# Patient Record
Sex: Male | Born: 1970 | Race: White | Hispanic: No | Marital: Single | Smoking: Never smoker
Health system: Southern US, Community
[De-identification: ages and names within clinical notes are randomized; demographics above are authoritative.]

## PROBLEM LIST (undated history)

## (undated) DIAGNOSIS — J45909 Unspecified asthma, uncomplicated: Secondary | ICD-10-CM

## (undated) DIAGNOSIS — F319 Bipolar disorder, unspecified: Secondary | ICD-10-CM

## (undated) DIAGNOSIS — G35 Multiple sclerosis: Principal | ICD-10-CM

## (undated) DIAGNOSIS — K439 Ventral hernia without obstruction or gangrene: Secondary | ICD-10-CM

## (undated) DIAGNOSIS — K219 Gastro-esophageal reflux disease without esophagitis: Secondary | ICD-10-CM

## (undated) DIAGNOSIS — I10 Essential (primary) hypertension: Secondary | ICD-10-CM

## (undated) DIAGNOSIS — L409 Psoriasis, unspecified: Secondary | ICD-10-CM

## (undated) DIAGNOSIS — I639 Cerebral infarction, unspecified: Secondary | ICD-10-CM

## (undated) DIAGNOSIS — G4700 Insomnia, unspecified: Secondary | ICD-10-CM

## (undated) HISTORY — DX: Essential (primary) hypertension: I10

## (undated) HISTORY — DX: Unspecified asthma, uncomplicated: J45.909

## (undated) HISTORY — DX: Insomnia, unspecified: G47.00

## (undated) HISTORY — DX: Ventral hernia without obstruction or gangrene: K43.9

## (undated) HISTORY — DX: Bipolar disorder, unspecified: F31.9

## (undated) HISTORY — DX: Psoriasis, unspecified: L40.9

## (undated) HISTORY — PX: HERNIA REPAIR: SHX51

## (undated) HISTORY — DX: Gastro-esophageal reflux disease without esophagitis: K21.9

## (undated) HISTORY — DX: Multiple sclerosis: G35

---

## 2009-04-18 ENCOUNTER — Ambulatory Visit: Payer: Self-pay | Admitting: Internal Medicine

## 2009-04-18 DIAGNOSIS — G4700 Insomnia, unspecified: Secondary | ICD-10-CM | POA: Insufficient documentation

## 2009-04-18 DIAGNOSIS — F319 Bipolar disorder, unspecified: Secondary | ICD-10-CM

## 2009-04-18 DIAGNOSIS — J45909 Unspecified asthma, uncomplicated: Secondary | ICD-10-CM | POA: Insufficient documentation

## 2009-04-18 HISTORY — DX: Unspecified asthma, uncomplicated: J45.909

## 2009-04-18 HISTORY — DX: Bipolar disorder, unspecified: F31.9

## 2009-04-18 LAB — CONVERTED CEMR LAB
AST: 26 units/L (ref 0–37)
Albumin: 3.8 g/dL (ref 3.5–5.2)
BUN: 11 mg/dL (ref 6–23)
Basophils Absolute: 0.1 10*3/uL (ref 0.0–0.1)
Bilirubin Urine: NEGATIVE
CO2: 30 meq/L (ref 19–32)
Chloride: 110 meq/L (ref 96–112)
Cholesterol: 141 mg/dL (ref 0–200)
Eosinophils Absolute: 0.2 10*3/uL (ref 0.0–0.7)
GFR calc non Af Amer: 99.37 mL/min (ref >60)
Glucose, Bld: 97 mg/dL (ref 70–99)
HCT: 43.5 % (ref 39.0–52.0)
Hemoglobin: 14.6 g/dL (ref 13.0–17.0)
Ketones, ur: NEGATIVE mg/dL
LDL Cholesterol: 73 mg/dL (ref 0–99)
Leukocytes, UA: NEGATIVE
Lymphs Abs: 1.1 10*3/uL (ref 0.7–4.0)
MCHC: 33.6 g/dL (ref 30.0–36.0)
MCV: 91.2 fL (ref 78.0–100.0)
Monocytes Absolute: 0.5 10*3/uL (ref 0.1–1.0)
Neutro Abs: 5 10*3/uL (ref 1.4–7.7)
Nitrite: NEGATIVE
PSA: 0.72 ng/mL (ref 0.10–4.00)
Platelets: 202 10*3/uL (ref 150.0–400.0)
Potassium: 4.5 meq/L (ref 3.5–5.1)
RDW: 13.1 % (ref 11.5–14.6)
Sodium: 143 meq/L (ref 135–145)
Specific Gravity, Urine: 1.015 (ref 1.000–1.030)
Total Bilirubin: 0.4 mg/dL (ref 0.3–1.2)
Total Protein, Urine: NEGATIVE mg/dL
Triglycerides: 74 mg/dL (ref 0.0–149.0)
VLDL: 14.8 mg/dL (ref 0.0–40.0)
pH: 6 (ref 5.0–8.0)

## 2009-04-27 ENCOUNTER — Telehealth: Payer: Self-pay | Admitting: Internal Medicine

## 2009-05-05 ENCOUNTER — Telehealth: Payer: Self-pay | Admitting: Internal Medicine

## 2009-05-06 ENCOUNTER — Telehealth: Payer: Self-pay | Admitting: Internal Medicine

## 2010-03-07 NOTE — Miscellaneous (Signed)
Summary: DESIGNATED PARTY RELEASE/[facility]  DESIGNATED PARTY RELEASE/[facility]   Imported By: Lester [place] 04/21/2009 07:47:34  _____________________________________________________________________  External Attachment:    Type:   Image     Comment:   External Document

## 2010-03-07 NOTE — Progress Notes (Signed)
Summary: Seroquel  Phone Note Call from Patient   Caller: Patient [phone] Summary of Call: pt called stating that he would like to stop taking seroquel. pt states he is having nausea and is anxious until he eats and takes his vitamins. Pt also report "dreaming alot" . Per pt he has not gone to Psych referral because he was told by MD tat he should go only if he felt he needed to. Initial call taken by: Margaret Pyle, CMA,  May 05, 2009 3:15 PM  Follow-up for Phone Call        pt should cont seroquel as is;  should see psychiatry asap  (I did not state that he should only go "if he needed to") Follow-up by: Corwin Levins MD,  May 05, 2009 3:16 PM  Additional Follow-up for Phone Call Additional follow up Details #1::        I spoke with the pt, and informed him of the above. Additional Follow-up by: Margaret Pyle, CMA,  May 05, 2009 3:18 PM/ Sydell Axon, SMA

## 2010-03-07 NOTE — Assessment & Plan Note (Signed)
Summary: NEW PT/BCBS/#/METABOLISM TOO HIGH?/LB   Vital Signs:  Patient profile:   40 year old male Height:      67 inches Weight:      163.50 pounds BMI:     25.70 O2 Sat:      96 % on Room air Temp:     97.8 degrees F oral Pulse rate:   57 / minute BP sitting:   140 / 94  (left arm) Cuff size:   regular  Vitals Entered ByZella Ball Ewing (April 18, 2009 3:45 PM)  O2 Flow:  Room air CC: New Pt, New BCBS/RE   CC:  New Pt and New BCBS/RE.  History of Present Illness: here to establish,  recently begun on seroquel as above which has really helped his racing thoughts;  has increase racing thoughts, stayed up for several nights in a row, and well as "low times"  and more increased work problems recently in the past yr;  Pt denies CP, sob, doe, wheezing, orthopnea, pnd, worsening LE edema, palps, dizziness or syncope  Pt denies new neuro symptoms such as headache, facial or extremity weakness   Preventive Screening-Counseling & Management  Alcohol-Tobacco     Smoking Status: never      Drug Use:  no.    Problems Prior to Update: 1)  Preventive Health Care  (ICD-V70.0) 2)  Bipolar Disorder Unspecified  (ICD-296.80) 3)  Insomnia  (ICD-780.52) 4)  Asthma, Childhood  (ICD-493.00)  Medications Prior to Update: 1)  None  Current Medications (verified): 1)  Seroquel Xr 50 Mg Xr24h-Tab (Quetiapine Fumarate) .... 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 father with arthritis, heart disease, stroke, HTN, DM uncle and aunt with thyroid disease grandparent with heart diseaes, DM mother with TIA aunt with stroke  Social History: Reviewed history and no changes required. 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         all otherwise negative per pt -   Physical Exam  General:  alert and overweight-appearing.   Head:  normocephalic and atraumatic.   Eyes:  vision grossly intact, pupils equal, and pupils round.   Ears:  R ear normal and L ear normal.   Nose:  no external deformity and no nasal discharge.   Mouth:  no gingival abnormalities and pharynx pink and moist.   Neck:  supple and no masses.   Lungs:  normal respiratory effort and normal breath sounds.   Heart:  normal rate and regular rhythm.  Abdomen:  soft, non-tender, and normal bowel sounds.   Msk:  no joint tenderness and no joint swelling.   Extremities:  no edema, no erythema  Neurologic:  cranial nerves II-XII intact and strength normal in all extremities.   Psych:  not depressed appearing and moderately anxious.     Impression & Recommendations:  Problem # 1:  Preventive Health Care (ICD-V70.0)  Overall doing well, age appropriate education and  counseling updated and referral for appropriate preventive services done unless declined, immunizations up to date or declined, diet counseling done if overweight, urged to quit smoking if smokes , most recent labs reviewed and current ordered if appropriate, ecg reviewed or declined (interpretation per ECG scanned in the EMR if done); information regarding Medicare Prevention requirements given if appropriate   Orders: TLB-BMP (Basic Metabolic Panel-BMET) (80048-METABOL) TLB-CBC Platelet - w/Differential (85025-CBCD) TLB-Hepatic/Liver Function Pnl (80076-HEPATIC) TLB-Lipid Panel (80061-LIPID) TLB-TSH (Thyroid Stimulating Hormone) (84443-TSH) TLB-PSA (Prostate Specific Antigen) (84153-PSA) TLB-Udip ONLY (81003-UDIP)  Problem # 2:  BIPOLAR DISORDER UNSPECIFIED (ICD-296.80)  to increase the seroquel to 100 mg XR ;  refer psychiatry  Orders: Psychiatric Referral (Psych)  Complete Medication List: 1)  Seroquel Xr 50 Mg Xr24h-tab (Quetiapine fumarate) .... 2 by mouth at bedtime  Patient Instructions: 1)  increase the seroquel XR to 2 at night 2)  You will be contacted about the referral(s) to: Psychiatry 3)  Please go to the Lab in the basement for your blood and/or urine tests today 4)  Please schedule a follow-up appointment in 1 year or sooner if needed Prescriptions: SEROQUEL XR 50 MG XR24H-TAB (QUETIAPINE FUMARATE) 2 by mouth at bedtime  #60 x 5   Entered and Authorized by:   Corwin Levins MD   Signed by:   Corwin Levins MD on 04/18/2009   Method used:   Print then Give to Patient   RxID:   [id number]

## 2010-03-07 NOTE — Progress Notes (Signed)
Summary: Referral  Phone Note Call from Patient Call back at Home Phone [phone]   Caller: Patient [phone] Summary of Call: pt called stating that Dr. Jennelle Human does not accept pt Insuranace. Pt is requesting a new referral to Dr. Tomasa Rand. Initial call taken by: Margaret Pyle, CMA,  May 05, 2009 4:15 PM  Follow-up for Phone Call        ok for referral - I will do Follow-up by: Corwin Levins MD,  May 05, 2009 4:49 PM  Additional Follow-up for Phone Call Additional follow up Details #1::        pt informed Additional Follow-up by: Margaret Pyle, CMA,  May 05, 2009 4:53 PM

## 2010-03-07 NOTE — Progress Notes (Signed)
Summary: SUPPLEMENT  Phone Note Call from Patient   Summary of Call: Patient is requesting to know if it is ok to take L-tyrsine OTC?  Initial call taken by: Lamar Sprinkles, CMA,  May 06, 2009 3:45 PM  Follow-up for Phone Call        I would not do so at this time as we are not sure of the possible side effects that could results in other medication not working as well Follow-up by: Corwin Levins MD,  May 06, 2009 4:44 PM  Additional Follow-up for Phone Call Additional follow up Details #1::        pt informed Additional Follow-up by: Margaret Pyle, CMA,  May 09, 2009 3:44 PM

## 2010-03-07 NOTE — Progress Notes (Signed)
Summary: Seroquel  Phone Note Call from Patient   Caller: Patient [phone] Summary of Call: pt called requesting his current RX for Seroquel 50mg  qd be changes to 25mg  two times a day instead. pt says the daily dose is causing decreased energy. please advise Initial call taken by: Margaret Pyle, CMA,  April 27, 2009 2:36 PM  Follow-up for Phone Call        ok to do - to robin to handle Follow-up by: Corwin Levins MD,  April 27, 2009 2:46 PM    New/Updated Medications: SEROQUEL 25 MG TABS (QUETIAPINE FUMARATE) 1 by mouth two times a day Prescriptions: SEROQUEL 25 MG TABS (QUETIAPINE FUMARATE) 1 by mouth two times a day  #60 x 2   Entered by:   Scharlene Gloss   Authorized by:   Corwin Levins MD   Signed by:   Scharlene Gloss on 04/27/2009   Method used:   Faxed to ...       [facility] [address] (retail)       [address]       [place], [place]  [address]       Ph: [phone]       Fax: [phone]   RxID:   [id number]

## 2012-02-21 ENCOUNTER — Encounter: Payer: Self-pay | Admitting: Internal Medicine

## 2012-02-21 ENCOUNTER — Other Ambulatory Visit (INDEPENDENT_AMBULATORY_CARE_PROVIDER_SITE_OTHER): Payer: BC Managed Care – PPO

## 2012-02-21 ENCOUNTER — Ambulatory Visit (INDEPENDENT_AMBULATORY_CARE_PROVIDER_SITE_OTHER): Payer: BC Managed Care – PPO | Admitting: Internal Medicine

## 2012-02-21 VITALS — BP 130/80 | HR 46 | Temp 98.1°F | Ht 67.0 in | Wt 156.5 lb

## 2012-02-21 DIAGNOSIS — J209 Acute bronchitis, unspecified: Secondary | ICD-10-CM

## 2012-02-21 DIAGNOSIS — Z0000 Encounter for general adult medical examination without abnormal findings: Secondary | ICD-10-CM

## 2012-02-21 DIAGNOSIS — Z0001 Encounter for general adult medical examination with abnormal findings: Secondary | ICD-10-CM | POA: Insufficient documentation

## 2012-02-21 LAB — URINALYSIS, ROUTINE W REFLEX MICROSCOPIC
Bilirubin Urine: NEGATIVE
Nitrite: NEGATIVE
Total Protein, Urine: NEGATIVE
pH: 6 (ref 5.0–8.0)

## 2012-02-21 LAB — HEPATIC FUNCTION PANEL
ALT: 30 U/L (ref 0–53)
AST: 18 U/L (ref 0–37)
Alkaline Phosphatase: 86 U/L (ref 39–117)
Bilirubin, Direct: 0.1 mg/dL (ref 0.0–0.3)
Total Bilirubin: 0.7 mg/dL (ref 0.3–1.2)

## 2012-02-21 LAB — CBC WITH DIFFERENTIAL/PLATELET
Basophils Absolute: 0 10*3/uL (ref 0.0–0.1)
Basophils Relative: 0.2 % (ref 0.0–3.0)
Eosinophils Absolute: 0 10*3/uL (ref 0.0–0.7)
MCHC: 34.1 g/dL (ref 30.0–36.0)
MCV: 91.2 fl (ref 78.0–100.0)
Monocytes Absolute: 0.5 10*3/uL (ref 0.1–1.0)
Neutrophils Relative %: 72 % (ref 43.0–77.0)
Platelets: 190 10*3/uL (ref 150.0–400.0)
RDW: 14.4 % (ref 11.5–14.6)

## 2012-02-21 LAB — BASIC METABOLIC PANEL
CO2: 28 mEq/L (ref 19–32)
Chloride: 106 mEq/L (ref 96–112)
Potassium: 4.9 mEq/L (ref 3.5–5.1)

## 2012-02-21 LAB — LIPID PANEL
Cholesterol: 172 mg/dL (ref 0–200)
LDL Cholesterol: 107 mg/dL — ABNORMAL HIGH (ref 0–99)
Total CHOL/HDL Ratio: 3

## 2012-02-21 LAB — TSH: TSH: 0.86 u[IU]/mL (ref 0.35–5.50)

## 2012-02-21 MED ORDER — HYDROCODONE-HOMATROPINE 5-1.5 MG/5ML PO SYRP: 5.0000 mL | ORAL_SOLUTION | Freq: Four times a day (QID) | ORAL | Status: DC | PRN

## 2012-02-21 MED ORDER — AZITHROMYCIN 250 MG PO TABS: ORAL_TABLET | ORAL | Status: DC

## 2012-02-21 NOTE — Assessment & Plan Note (Signed)

## 2012-02-21 NOTE — Progress Notes (Signed)
Subjective:    Patient ID: Russell Moses., male    DOB: [birth year], 42 y.o.   MRN: [id number]  HPI Here for wellness and f/u after not seen since mar 2011;  Overall doing ok;  Pt denies CP, worsening SOB, DOE, wheezing, orthopnea, PND, worsening LE edema, palpitations, dizziness or syncope, except incidentally Here with acute onset mild to mod 2-3 days ST, HA, general weakness and malaise, with prod cough greenish sputum.   Pt denies neurological change such as new headache, facial or extremity weakness.  Pt denies polydipsia, polyuria, or low sugar symptoms. Pt states overall good compliance with treatment and medications, good tolerability, and has been trying to follow lower cholesterol diet.  Pt denies worsening depressive symptoms, suicidal ideation or panic. No fever, night sweats, wt loss, loss of appetite, or other constitutional symptoms.  Pt states good ability with ADL's, has low fall risk, home safety reviewed and adequate, no other significant changes in hearing or vision, and only occasionally active with exercise. No other new complaints Past Medical History  Diagnosis Date  . BIPOLAR DISORDER UNSPECIFIED 04/18/2009    Qualifier: Diagnosis of  By: Jonny Ruiz MD, Len Blalock   . ASTHMA, CHILDHOOD 04/18/2009    Qualifier: Diagnosis of  By: Jonny Ruiz MD, Len Blalock    History reviewed. No pertinent past surgical history.  reports that he has never smoked. He does not have any smokeless tobacco history on file. He reports that he does not drink alcohol or use illicit drugs. family history includes Diabetes in his father; Hypertension in his father; and Stroke in his father. No Known Allergies No current outpatient prescriptions on file prior to visit.   Review of Systems Constitutional: Negative for diaphoresis, activity change, appetite change or unexpected weight change.  HENT: Negative for hearing loss, ear pain, facial swelling, mouth sores and neck stiffness.   Eyes: Negative for pain, redness and  visual disturbance.  Respiratory: Negative for shortness of breath and wheezing.   Cardiovascular: Negative for chest pain and palpitations.  Gastrointestinal: Negative for diarrhea, blood in stool, abdominal distention or other pain Genitourinary: Negative for hematuria, flank pain or change in urine volume.  Musculoskeletal: Negative for myalgias and joint swelling.  Skin: Negative for color change and wound.  Neurological: Negative for syncope and numbness. other than noted Hematological: Negative for adenopathy.  Psychiatric/Behavioral: Negative for hallucinations, self-injury, decreased concentration and agitation.      Objective:   Physical Exam BP 130/80  Pulse 46  Temp 98.1 F (36.7 C) (Oral)  Ht 5\' 7"  (1.702 m)  Wt 156 lb 8 oz (70.988 kg)  BMI 24.51 kg/m2  SpO2 97% VS noted, mild ill Constitutional: Pt is oriented to person, place, and time. Appears well-developed and well-nourished.  Head: Normocephalic and atraumatic.  Right Ear: External ear normal.  Left Ear: External ear normal.  Nose: Nose normal.  Mouth/Throat: Oropharynx is clear and moist.  Bilat tm's with mild erythema.  Max sinus areas mild tender.  Pharynx with mild erythema, no exudate Eyes: Conjunctivae and EOM are normal. Pupils are equal, round, and reactive to light.  Neck: Normal range of motion. Neck supple. No JVD present. No tracheal deviation present.  Cardiovascular: Normal rate, regular rhythm, normal heart sounds and intact distal pulses.   Pulmonary/Chest: Effort normal and breath sounds normal.  - no rales or wheezing Abdominal: Soft. Bowel sounds are normal. There is no tenderness. No HSM  Musculoskeletal: Normal range of motion. Exhibits no edema.  Lymphadenopathy:  Has  no cervical adenopathy.  Neurological: Pt is alert and oriented to person, place, and time. Pt has normal reflexes. No cranial nerve deficit.  Skin: Skin is warm and dry. No rash noted.  Psychiatric:  Has  normal mood and  affect. Behavior is normal. except mild nervous    Assessment & Plan:

## 2012-02-21 NOTE — Assessment & Plan Note (Signed)
Mild to mod, for antibx course,  to f/u any worsening symptoms or concerns 

## 2012-02-21 NOTE — Patient Instructions (Addendum)
Please take all new medication as prescribed - the antibiotic, and cough medicine Please continue all other medications as before, and refills have been done if requested. Please have the pharmacy call with any other refills you may need. Please continue your efforts at being more active, low cholesterol diet, and weight control. Please go to the LAB in the Basement (turn left off the elevator) for the tests to be done today You will be contacted by phone if any changes need to be made immediately.  Otherwise, you will receive a letter about your results with an explanation, but please check with MyChart first. Thank you for enrolling in MyChart. Please follow the instructions below to securely access your online medical record. MyChart allows you to send messages to your doctor, view your test results, renew your prescriptions, schedule appointments, and more. To Log into My Chart online, please go by [employer] or [employer] to [email or web address], or download the MyChart App from the [employer] of [employer] .  Your Username is: [email or web address] (pass [email or web address]) Please send a practice Message on Mychart later today. Please return in 1 year for your yearly visit, or sooner if needed, with Lab testing done 3-5 days before

## 2012-03-22 ENCOUNTER — Other Ambulatory Visit: Payer: Self-pay

## 2012-08-18 ENCOUNTER — Telehealth: Payer: Self-pay

## 2012-08-18 DIAGNOSIS — R5383 Other fatigue: Secondary | ICD-10-CM

## 2012-08-18 NOTE — Telephone Encounter (Signed)
The patient would like a lab test to check his adrenal gland.  Please advise and order if ok also would like to know the cost of if possible of test.

## 2012-08-18 NOTE — Telephone Encounter (Signed)
Lab ordered,   Russell Moses to let pt know - should be done first thing in the AM, about 8 am if possible, at his convenience

## 2012-08-19 NOTE — Telephone Encounter (Signed)
Patient informed. 

## 2012-08-20 ENCOUNTER — Other Ambulatory Visit (INDEPENDENT_AMBULATORY_CARE_PROVIDER_SITE_OTHER): Payer: BC Managed Care – PPO

## 2012-08-20 DIAGNOSIS — R5381 Other malaise: Secondary | ICD-10-CM

## 2012-08-20 DIAGNOSIS — R5383 Other fatigue: Secondary | ICD-10-CM

## 2012-08-20 LAB — CORTISOL: Cortisol, Plasma: 16.1 ug/dL

## 2012-12-11 ENCOUNTER — Other Ambulatory Visit: Payer: Self-pay

## 2013-02-05 DIAGNOSIS — G35 Multiple sclerosis: Secondary | ICD-10-CM

## 2013-02-05 HISTORY — DX: Multiple sclerosis: G35

## 2013-02-23 ENCOUNTER — Other Ambulatory Visit (INDEPENDENT_AMBULATORY_CARE_PROVIDER_SITE_OTHER): Payer: BC Managed Care – PPO

## 2013-02-23 DIAGNOSIS — Z0000 Encounter for general adult medical examination without abnormal findings: Secondary | ICD-10-CM

## 2013-02-23 LAB — BASIC METABOLIC PANEL
BUN: 11 mg/dL (ref 6–23)
CHLORIDE: 106 meq/L (ref 96–112)
CO2: 27 meq/L (ref 19–32)
CREATININE: 0.9 mg/dL (ref 0.4–1.5)
Calcium: 9.4 mg/dL (ref 8.4–10.5)
GFR: 95.09 mL/min (ref >60.00)
Glucose, Bld: 105 mg/dL — ABNORMAL HIGH (ref 70–99)
POTASSIUM: 3.8 meq/L (ref 3.5–5.1)
SODIUM: 140 meq/L (ref 135–145)

## 2013-02-23 LAB — CBC WITH DIFFERENTIAL/PLATELET
BASOS ABS: 0 10*3/uL (ref 0.0–0.1)
Basophils Relative: 0.3 % (ref 0.0–3.0)
EOS ABS: 0.1 10*3/uL (ref 0.0–0.7)
Eosinophils Relative: 0.9 % (ref 0.0–5.0)
HCT: 47.1 % (ref 39.0–52.0)
Hemoglobin: 16 g/dL (ref 13.0–17.0)
LYMPHS PCT: 24.4 % (ref 12.0–46.0)
Lymphs Abs: 1.3 10*3/uL (ref 0.7–4.0)
MCHC: 34 g/dL (ref 30.0–36.0)
MCV: 90.7 fl (ref 78.0–100.0)
MONOS PCT: 7.6 % (ref 3.0–12.0)
Monocytes Absolute: 0.4 10*3/uL (ref 0.1–1.0)
NEUTROS ABS: 3.5 10*3/uL (ref 1.4–7.7)
NEUTROS PCT: 66.8 % (ref 43.0–77.0)
PLATELETS: 178 10*3/uL (ref 150.0–400.0)
RBC: 5.2 Mil/uL (ref 4.22–5.81)
RDW: 13.7 % (ref 11.5–14.6)
WBC: 5.3 10*3/uL (ref 4.5–10.5)

## 2013-02-23 LAB — PSA: PSA: 0.64 ng/mL (ref 0.10–4.00)

## 2013-02-23 LAB — LIPID PANEL
CHOL/HDL RATIO: 3
Cholesterol: 145 mg/dL (ref 0–200)
HDL: 46 mg/dL (ref >39.00)
LDL CALC: 85 mg/dL (ref 0–99)
TRIGLYCERIDES: 68 mg/dL (ref 0.0–149.0)
VLDL: 13.6 mg/dL (ref 0.0–40.0)

## 2013-02-23 LAB — HEPATIC FUNCTION PANEL
ALK PHOS: 88 U/L (ref 39–117)
ALT: 28 U/L (ref 0–53)
AST: 22 U/L (ref 0–37)
Albumin: 4.1 g/dL (ref 3.5–5.2)
BILIRUBIN DIRECT: 0.1 mg/dL (ref 0.0–0.3)
BILIRUBIN TOTAL: 0.7 mg/dL (ref 0.3–1.2)
Total Protein: 7.1 g/dL (ref 6.0–8.3)

## 2013-02-23 LAB — URINALYSIS, ROUTINE W REFLEX MICROSCOPIC
Bilirubin Urine: NEGATIVE
HGB URINE DIPSTICK: NEGATIVE
Leukocytes, UA: NEGATIVE
NITRITE: NEGATIVE
PH: 5.5 (ref 5.0–8.0)
RBC / HPF: NONE SEEN (ref 0–?)
Specific Gravity, Urine: >=1.03 — AB (ref 1.000–1.030)
TOTAL PROTEIN, URINE-UPE24: NEGATIVE
URINE GLUCOSE: NEGATIVE
Urobilinogen, UA: 0.2 (ref 0.0–1.0)

## 2013-02-23 LAB — TSH: TSH: 0.91 u[IU]/mL (ref 0.35–5.50)

## 2013-02-25 ENCOUNTER — Encounter: Payer: Self-pay | Admitting: Internal Medicine

## 2013-02-25 ENCOUNTER — Ambulatory Visit (INDEPENDENT_AMBULATORY_CARE_PROVIDER_SITE_OTHER): Payer: BC Managed Care – PPO | Admitting: Internal Medicine

## 2013-02-25 VITALS — BP 140/92 | HR 60 | Temp 97.3°F | Ht 67.0 in | Wt 160.0 lb

## 2013-02-25 DIAGNOSIS — Z0000 Encounter for general adult medical examination without abnormal findings: Secondary | ICD-10-CM

## 2013-02-25 DIAGNOSIS — I10 Essential (primary) hypertension: Secondary | ICD-10-CM | POA: Insufficient documentation

## 2013-02-25 DIAGNOSIS — J069 Acute upper respiratory infection, unspecified: Secondary | ICD-10-CM | POA: Insufficient documentation

## 2013-02-25 DIAGNOSIS — R030 Elevated blood-pressure reading, without diagnosis of hypertension: Secondary | ICD-10-CM

## 2013-02-25 NOTE — Assessment & Plan Note (Signed)
Likely situational related to recent illnes

## 2013-02-25 NOTE — Patient Instructions (Signed)
Please continue all other medications as before, and refills have been done if requested. Please have the pharmacy call with any other refills you may need. Please continue your efforts at being more active, low cholesterol diet, and weight control. You are otherwise up to date with prevention measures today. Please keep your appointments with your specialists as you may have planned Your EKG was ok today You are given the copy of your blood work  Please remember to sign up for My Chart if you have not done so, as this will be important to you in the future with finding out test results, communicating by private email, and scheduling acute appointments online when needed.  Please return in 1 year for your yearly visit, or sooner if needed, with Lab testing done 3-5 days before

## 2013-02-25 NOTE — Progress Notes (Signed)
Pre-visit discussion using our clinic review tool. No additional management support is needed unless otherwise documented below in the visit note.  

## 2013-02-25 NOTE — Assessment & Plan Note (Signed)

## 2013-02-25 NOTE — Assessment & Plan Note (Signed)
Resolving, no further antibx needed

## 2013-02-25 NOTE — Progress Notes (Signed)
Subjective:    Patient ID: Russell Penner., male    DOB: 28-Jul-1970, 43 y.o.   MRN: [id number]  HPI  Here for wellness and f/u;  Overall doing ok;  Pt denies CP, worsening SOB, DOE, wheezing, orthopnea, PND, worsening LE edema, palpitations, dizziness or syncope.  Pt denies neurological change such as new headache, facial or extremity weakness.  Pt denies polydipsia, polyuria, or low sugar symptoms. Pt states overall good compliance with treatment and medications, good tolerability, and has been trying to follow lower cholesterol diet.  Pt denies worsening depressive symptoms, suicidal ideation or panic. No fever, night sweats, wt loss, loss of appetite, or other constitutional symptoms.  Pt states good ability with ADL's, has low fall risk, home safety reviewed and adequate, no other significant changes in hearing or vision, and only occasionally active with exercise.   Incidentally seen at UC in [place], tx for URI per pt with levaquin 750 course and cough med, still feels general weakness and malaise, some hard to take deeper breaths, persitent cough though less than to start, Pt denies fever, chest pain, increased sob or doe, wheezing, orthopnea, PND, increased LE swelling, palpitations, dizziness or syncope. Still with significant fatigue had head congestion, nervous. Feels jittery, some improved with BM for some reason. MVI seems to help his energy for a short while. Declines immunizations today.  Past Medical History  Diagnosis Date  . BIPOLAR DISORDER UNSPECIFIED 04/18/2009    Qualifier: Diagnosis of  By: Jonny Ruiz MD, Len Blalock   . ASTHMA, CHILDHOOD 04/18/2009    Qualifier: Diagnosis of  By: Jonny Ruiz MD, Len Blalock    No past surgical history on file.  reports that he has never smoked. He does not have any smokeless tobacco history on file. He reports that he does not drink alcohol or use illicit drugs. family history includes Diabetes in his father; Hypertension in his father; Stroke in his father. No  Known Allergies No current outpatient prescriptions on file prior to visit.   No current facility-administered medications on file prior to visit.    Review of Systems Constitutional: Negative for diaphoresis, activity change, appetite change or unexpected weight change.  HENT: Negative for hearing loss, ear pain, facial swelling, mouth sores and neck stiffness.   Eyes: Negative for pain, redness and visual disturbance.  Respiratory: Negative for shortness of breath and wheezing.   Cardiovascular: Negative for chest pain and palpitations.  Gastrointestinal: Negative for diarrhea, blood in stool, abdominal distention or other pain Genitourinary: Negative for hematuria, flank pain or change in urine volume.  Musculoskeletal: Negative for myalgias and joint swelling.  Skin: Negative for color change and wound.  Neurological: Negative for syncope and numbness. other than noted Hematological: Negative for adenopathy.  Psychiatric/Behavioral: Negative for hallucinations, self-injury, decreased concentration and agitation.      Objective:   Physical Exam BP 140/92  Pulse 60  Temp(Src) 97.3 F (36.3 C) (Oral)  Ht 5\' 7"  (1.702 m)  Wt 160 lb (72.576 kg)  BMI 25.05 kg/m2  SpO2 98% VS noted, not ill appearing Constitutional: Pt is oriented to person, place, and time. Appears well-developed and well-nourished.  Head: Normocephalic and atraumatic.  Right Ear: External ear normal.  Left Ear: External ear normal.  Nose: Nose normal.  Mouth/Throat: Oropharynx is clear and moist. with mild erythema Eyes: Conjunctivae and EOM are normal. Pupils are equal, round, and reactive to light.  Neck: Normal range of motion. Neck supple. No JVD present. No tracheal deviation present.  Cardiovascular:  Normal rate, regular rhythm, normal heart sounds and intact distal pulses.   Pulmonary/Chest: Effort normal and breath sounds normal.  Abdominal: Soft. Bowel sounds are normal. There is no tenderness. No  HSM  Musculoskeletal: Normal range of motion. Exhibits no edema.  Lymphadenopathy:  Has no cervical adenopathy.  Neurological: Pt is alert and oriented to person, place, and time. Pt has normal reflexes. No cranial nerve deficit.  Skin: Skin is warm and dry. No rash noted.  Psychiatric:  Has  normal mood and affect. Behavior is normal.     Assessment & Plan:

## 2013-03-02 ENCOUNTER — Telehealth: Payer: Self-pay

## 2013-03-02 NOTE — Telephone Encounter (Signed)
The patient is hoping to get a "thyroid function test" as soon as possible.

## 2013-03-03 NOTE — Telephone Encounter (Signed)
Patient informed of MD instructions.  The patient  Did want to know what could be causing these symptoms

## 2013-03-03 NOTE — Telephone Encounter (Signed)
Patient has called back and requesting TSH , call back number [phone]

## 2013-03-03 NOTE — Telephone Encounter (Signed)
Patient called back stating needs TSH and adrenal gland checked.  States he is burning up food fast, is figity and fatigued.  Blood pressure is up and down.  The patient would like to come to the lab to have TSH and Adrenal gland checked.

## 2013-03-03 NOTE — Telephone Encounter (Signed)
Thyroid just checked, as well your other electrolytes  No need for further testing for thyroid or adrenal at this time

## 2013-03-03 NOTE — Telephone Encounter (Signed)
I suspect anxiety as the testing is normal.  If the weight is stable (or even goes up), he may wish to consider tx for anxiety

## 2013-03-04 ENCOUNTER — Ambulatory Visit (INDEPENDENT_AMBULATORY_CARE_PROVIDER_SITE_OTHER): Payer: BC Managed Care – PPO | Admitting: Internal Medicine

## 2013-03-04 ENCOUNTER — Encounter: Payer: Self-pay | Admitting: Internal Medicine

## 2013-03-04 VITALS — BP 150/94 | HR 61 | Temp 97.6°F | Ht 67.0 in | Wt 161.0 lb

## 2013-03-04 DIAGNOSIS — F411 Generalized anxiety disorder: Secondary | ICD-10-CM | POA: Insufficient documentation

## 2013-03-04 DIAGNOSIS — I10 Essential (primary) hypertension: Secondary | ICD-10-CM

## 2013-03-04 DIAGNOSIS — J069 Acute upper respiratory infection, unspecified: Secondary | ICD-10-CM

## 2013-03-04 MED ORDER — SERTRALINE HCL 100 MG PO TABS: 100.0000 mg | ORAL_TABLET | Freq: Every day | ORAL | Status: DC

## 2013-03-04 MED ORDER — CITALOPRAM HYDROBROMIDE 20 MG PO TABS: 20.0000 mg | ORAL_TABLET | Freq: Every day | ORAL | Status: DC

## 2013-03-04 MED ORDER — IRBESARTAN 75 MG PO TABS: 75.0000 mg | ORAL_TABLET | Freq: Every day | ORAL | Status: DC

## 2013-03-04 NOTE — Telephone Encounter (Signed)
Patient informed and transferred to scheduler for appointment to discuss with PCP.

## 2013-03-04 NOTE — Progress Notes (Signed)
   Subjective:    Patient ID: Russell Moses., male    DOB: 07/31/1970, 43 y.o.   MRN: [id number]  HPI  Here to f/u, c/o anxiety, has also persistent mild elev BP, .Pt denies chest pain, increased sob or doe, wheezing, orthopnea, PND, increased LE swelling, palpitations, dizziness or syncope.  Feels like has to eat every 2 hrs, as he seems to get weak and fatigued, like my "parasympathetic and sympathetic systems are off balance."  Denies worsening depressive symptoms, suicidal ideation, or panic.   Wt no change since last visit, is overall down from 165 about 1-2 mo. Denies worsening reflux, abd pain, dysphagia, n/v, bowel change or blood, except for nausea onset with standing up occas, no vomiting, happaned last this am, possibly more freq since last resp infection.  Past Medical History  Diagnosis Date  . BIPOLAR DISORDER UNSPECIFIED 04/18/2009    Qualifier: Diagnosis of  By: Jonny Ruiz MD, Len Blalock   . ASTHMA, CHILDHOOD 04/18/2009    Qualifier: Diagnosis of  By: Jonny Ruiz MD, Len Blalock    No past surgical history on file.  reports that he has never smoked. He does not have any smokeless tobacco history on file. He reports that he does not drink alcohol or use illicit drugs. family history includes Diabetes in his father; Hypertension in his father; Stroke in his father. No Known Allergies No current outpatient prescriptions on file prior to visit.   No current facility-administered medications on file prior to visit.   Review of Systems  Constitutional: Negative for unexpected weight change, or unusual diaphoresis  HENT: Negative for tinnitus.   Eyes: Negative for photophobia and visual disturbance.  Respiratory: Negative for choking and stridor.   Gastrointestinal: Negative for vomiting and blood in stool.  Genitourinary: Negative for hematuria and decreased urine volume.  Musculoskeletal: Negative for acute joint swelling Skin: Negative for color change and wound.  Neurological: Negative for  tremors and numbness other than noted  Psychiatric/Behavioral: Negative for decreased concentration or  hyperactivity.        Objective:   Physical Exam BP 150/94  Pulse 61  Temp(Src) 97.6 F (36.4 C) (Oral)  Ht 5\' 7"  (1.702 m)  Wt 161 lb (73.029 kg)  BMI 25.21 kg/m2  SpO2 96% VS noted,  Constitutional: Pt appears well-developed and well-nourished.  HENT: Head: NCAT.  Right Ear: External ear normal.  Left Ear: External ear normal.  Eyes: Conjunctivae and EOM are normal. Pupils are equal, round, and reactive to light.  Neck: Normal range of motion. Neck supple.  Cardiovascular: Normal rate and regular rhythm.   Pulmonary/Chest: Effort normal and breath sounds normal.  Abd:  Soft, NT, non-distended, + BS Neurological: Pt is alert. Not confused  Skin: Skin is warm. No erythema.  Psychiatric: Pt behavior is normal. Thought content normal. 1-2+ nervous, not depressed affect    Assessment & Plan:

## 2013-03-04 NOTE — Progress Notes (Signed)
Pre-visit discussion using our clinic review tool. No additional management support is needed unless otherwise documented below in the visit note.  

## 2013-03-04 NOTE — Assessment & Plan Note (Signed)
Now resolved, pt reassured

## 2013-03-04 NOTE — Assessment & Plan Note (Signed)
Ok to start ARB low dose - avapro 75 qd, f/u 1 mo BP Readings from Last 3 Encounters:  03/04/13 150/94  02/25/13 140/92  02/21/12 130/80

## 2013-03-04 NOTE — Assessment & Plan Note (Addendum)
Has taken lexapro in the past but did not seem to work well for him, celexa seems to knock his sister out, ok to try zoloft 100 qd,  to f/u any worsening symptoms or concerns, consider psychiatry referral ADD;  Pt states does want to celexa, will cancel zoloft

## 2013-03-04 NOTE — Addendum Note (Signed)
Addended by: Corwin Levins on: 03/04/2013 11:21 AM   Modules accepted: Orders, Medications

## 2013-03-04 NOTE — Patient Instructions (Addendum)
Please take all new medication as prescribed - the generic for celexa (for nerves) and the generic for Avapro (low dose)  Please start the Zoloft at HALF pill for one week, then whole pill after that  Please continue all other medications as before, and refills have been done if requested.  Please return in 1 months, or sooner if needed

## 2013-03-06 ENCOUNTER — Telehealth: Payer: Self-pay | Admitting: Internal Medicine

## 2013-03-06 NOTE — Telephone Encounter (Signed)
Relevant patient education assigned to patient using Emmi. ° °

## 2013-03-10 ENCOUNTER — Telehealth: Payer: Self-pay

## 2013-03-10 MED ORDER — IRBESARTAN 300 MG PO TABS: 300.0000 mg | ORAL_TABLET | Freq: Every day | ORAL | Status: DC

## 2013-03-10 NOTE — Telephone Encounter (Signed)
The patient is still having BP problems.  Went to the ER last night BP was 180/88. And rechecked this morning and back up 16p/98.  They put him on clonidine last night to bring it down, but is back up this am,  Advise please on OV?  Call back number is [phone]

## 2013-03-10 NOTE — Telephone Encounter (Signed)
Patient informed of  MD instructions on ROV and  Medication increase

## 2013-03-10 NOTE — Telephone Encounter (Signed)
Ok to change the avapro to 300 mg per day (can take 4 of his 75 mg pills)  I have sent a new rx to pharmacy  Needs ROV 1 wk

## 2013-03-11 ENCOUNTER — Telehealth: Payer: Self-pay

## 2013-03-11 NOTE — Telephone Encounter (Signed)
They last 24 hrs, so hopefully this wont be a significant problem

## 2013-03-11 NOTE — Telephone Encounter (Signed)
Patient informed. 

## 2013-03-11 NOTE — Telephone Encounter (Signed)
The patient is taking 4 of his 75 mg avapro.  He is taking them all in the morning and wanted to know what to do when his BP goes up later in the day?

## 2013-03-13 ENCOUNTER — Telehealth: Payer: Self-pay

## 2013-03-13 NOTE — Telephone Encounter (Signed)
Ok to continue taking 4 of the 75 mg avapro per day until gone, then change to the 300 mg per day  Remember, the numbers done mean much, the important thing is the effect this has, and given his recent and current BP, this would not be too much.  He should call if the SBP (top number) is < 110, but this would be unlikely, as even the 300 mg only works to a certain amount

## 2013-03-13 NOTE — Telephone Encounter (Signed)
Patient informed of PCP's instructions on medication.

## 2013-03-13 NOTE — Telephone Encounter (Signed)
Message left at 7:10am on my voice mail this am.  Patient checked his BP and was 148/91.  He is concerned that he should not take the full dose of BP med., believes his BP will drop too low.   ADVISE PLEASE

## 2013-03-17 ENCOUNTER — Encounter: Payer: Self-pay | Admitting: Internal Medicine

## 2013-03-17 ENCOUNTER — Ambulatory Visit (INDEPENDENT_AMBULATORY_CARE_PROVIDER_SITE_OTHER): Payer: BC Managed Care – PPO | Admitting: Internal Medicine

## 2013-03-17 VITALS — BP 140/90 | HR 94 | Temp 98.5°F | Ht 67.0 in | Wt 153.0 lb

## 2013-03-17 DIAGNOSIS — F411 Generalized anxiety disorder: Secondary | ICD-10-CM

## 2013-03-17 DIAGNOSIS — I10 Essential (primary) hypertension: Secondary | ICD-10-CM

## 2013-03-17 MED ORDER — AMLODIPINE BESYLATE 5 MG PO TABS: 5.0000 mg | ORAL_TABLET | Freq: Every day | ORAL | Status: DC

## 2013-03-17 MED ORDER — HYDROCHLOROTHIAZIDE 12.5 MG PO TABS: 12.5000 mg | ORAL_TABLET | Freq: Every day | ORAL | Status: DC

## 2013-03-17 NOTE — Progress Notes (Signed)
Pre-visit discussion using our clinic review tool. No additional management support is needed unless otherwise documented below in the visit note.  

## 2013-03-17 NOTE — Progress Notes (Signed)
   Subjective:    Patient ID: Russell Penner., male    DOB: 1971-02-01, 43 y.o.   MRN: [id number]  HPI  Here to f/u, has persistent elev BP, felt "off" so has missed 3 days work.   BP occas still 170's despite taking meds.  Tolerating ok. Seen at [facility] hosp ER - HCT 12.5 added, but BP still in the 140-150's.   Pt denies chest pain, increased sob or doe, wheezing, orthopnea, PND, increased LE swelling, palpitations, dizziness or syncope.  Pt denies new neurological symptoms such as new headache, or facial or extremity weakness or numbness   Pt denies polydipsia, polyuria. No other acute illness - no fever, cough, HA, ST.  Denies worsening depressive symptoms, suicidal ideation, or panic; has ongoing anxiety Past Medical History  Diagnosis Date  . BIPOLAR DISORDER UNSPECIFIED 04/18/2009    Qualifier: Diagnosis of  By: Jonny Ruiz MD, Len Blalock   . ASTHMA, CHILDHOOD 04/18/2009    Qualifier: Diagnosis of  By: Jonny Ruiz MD, Len Blalock    No past surgical history on file.  reports that he has never smoked. He does not have any smokeless tobacco history on file. He reports that he does not drink alcohol or use illicit drugs. family history includes Diabetes in his father; Hypertension in his father; Stroke in his father. No Known Allergies Current Outpatient Prescriptions on File Prior to Visit  Medication Sig Dispense Refill  . citalopram (CELEXA) 20 MG tablet Take 1 tablet (20 mg total) by mouth daily.  90 tablet  3  . irbesartan (AVAPRO) 300 MG tablet Take 1 tablet (300 mg total) by mouth daily.  90 tablet  3   No current facility-administered medications on file prior to visit.   Review of Systems  All otherwise neg per pt     Objective:   Physical Exam BP 140/90  Pulse 94  Temp(Src) 98.5 F (36.9 C) (Oral)  Ht 5\' 7"  (1.702 m)  Wt 153 lb (69.4 kg)  BMI 23.96 kg/m2  SpO2 96% VS noted, not ill appearing Constitutional: Pt appears well-developed and well-nourished.  HENT: Head: NCAT.  Right Ear:  External ear normal.  Left Ear: External ear normal.  Eyes: Conjunctivae and EOM are normal. Pupils are equal, round, and reactive to light.  Neck: Normal range of motion. Neck supple.  Cardiovascular: Normal rate and regular rhythm.   Pulmonary/Chest: Effort normal and breath sounds normal.  Abd:  Soft, NT, non-distended, + BS, no bruit Neurological: Pt is alert. Not confused  Skin: Skin is warm. No erythema.  Psychiatric: Pt behavior is normal. Thought content normal. 2+ nervous, not depressed affect    Assessment & Plan:

## 2013-03-17 NOTE — Patient Instructions (Signed)
Please take all new medication as prescribed - the amlodipine 5 mg per day Please continue all other medications as before, and refills have been done for the HCT pill Please have the pharmacy call with any other refills you may need.  Please return in 3 months, or sooner if needed

## 2013-03-18 ENCOUNTER — Telehealth: Payer: Self-pay

## 2013-03-18 NOTE — Telephone Encounter (Signed)
Patient needs letter redone from yesterday changing illness on the 11th should be the 10th.  Separate letter ok to return to work on 03/18/13.  Letters have been done and on MD's desk to sign.

## 2013-03-18 NOTE — Telephone Encounter (Signed)
Ok to cont taking the medication, as this is not likely related to the treatment based on your exam today

## 2013-03-18 NOTE — Telephone Encounter (Signed)
The patient called to inform after taking bp meds. He has no energy for about 4 to 5 hours.  Advise please

## 2013-03-19 NOTE — Telephone Encounter (Signed)
Called left detailed message of MD instructions. 

## 2013-03-19 NOTE — Telephone Encounter (Signed)
Called the patient left a detailed message of MD instructions. 

## 2013-03-19 NOTE — Telephone Encounter (Signed)
Needs OV for further evaluation if pt feels this is a significant symptoms, as we cannot diagnose his problem without an exam

## 2013-03-19 NOTE — Telephone Encounter (Signed)
The patient has left 2 messages since 8 am this morning.  States checked BP around 8 am was 119/77 (left message), called again around 9:30am (left 2nd message) checked BP and was 119/82. Second message stated he also checked it at 5:35 am BP was 137/80.  Patients message stated he is feeling draggy, he had to leave work and come home until he felt better.  Advise.

## 2013-03-19 NOTE — Telephone Encounter (Signed)
Patient called back and states he is having tingling in his hands

## 2013-03-19 NOTE — Telephone Encounter (Signed)
Ok to cont meds as is since these BP are excellent

## 2013-03-20 NOTE — Assessment & Plan Note (Signed)
Still mild uncontrolled, despite adding HCT to med regimen, ok for adding norvasc 5 qd, cont all other meds,  to f/u any worsening symptoms or concerns, f/u BP at home and next visit BP Readings from Last 3 Encounters:  03/17/13 140/90  03/04/13 150/94  02/25/13 140/92

## 2013-03-20 NOTE — Assessment & Plan Note (Signed)
Ongoing, mod to severe currently but declines tx or need for counseling

## 2013-03-25 ENCOUNTER — Telehealth: Payer: Self-pay

## 2013-03-25 NOTE — Telephone Encounter (Signed)
Called the patient back left a detailed message of MD instructions. 

## 2013-03-25 NOTE — Telephone Encounter (Signed)
Please consider OV 

## 2013-03-25 NOTE — Telephone Encounter (Signed)
Patient called left message he is taking his BP and Hctz medication.   States he is drinking more and his muscles ache.   Should he add multivitamin and minerals, Potassium??

## 2013-03-26 ENCOUNTER — Ambulatory Visit: Payer: BC Managed Care – PPO | Admitting: Internal Medicine

## 2013-03-26 ENCOUNTER — Encounter: Payer: Self-pay | Admitting: Internal Medicine

## 2013-03-26 ENCOUNTER — Ambulatory Visit (INDEPENDENT_AMBULATORY_CARE_PROVIDER_SITE_OTHER): Payer: BC Managed Care – PPO | Admitting: Internal Medicine

## 2013-03-26 VITALS — BP 140/80 | HR 59 | Temp 98.4°F | Ht 67.0 in | Wt 155.1 lb

## 2013-03-26 DIAGNOSIS — F319 Bipolar disorder, unspecified: Secondary | ICD-10-CM

## 2013-03-26 DIAGNOSIS — R5383 Other fatigue: Secondary | ICD-10-CM | POA: Insufficient documentation

## 2013-03-26 DIAGNOSIS — R5381 Other malaise: Secondary | ICD-10-CM | POA: Insufficient documentation

## 2013-03-26 DIAGNOSIS — I10 Essential (primary) hypertension: Secondary | ICD-10-CM

## 2013-03-26 NOTE — Assessment & Plan Note (Signed)
Improved, but cant r/o dizziness related to diuretic compeletly, ok to d/c hct, ok to start the amlod 5 qd, cont all other meds BP Readings from Last 3 Encounters:  03/26/13 140/80  03/17/13 140/90  03/04/13 150/94

## 2013-03-26 NOTE — Assessment & Plan Note (Signed)
For lab eval, but suspect may signficant emotional issue, consider psychiatric evaluation/tx, decliens today

## 2013-03-26 NOTE — Progress Notes (Signed)
Pre-visit discussion using our clinic review tool. No additional management support is needed unless otherwise documented below in the visit note.  

## 2013-03-26 NOTE — Patient Instructions (Addendum)
OK to stop the hydrochlorothiazide fluid pill Please continue all other medications as before, including OK to start the amlodipine 5 mg per day  Please go to the LAB in the Basement (turn left off the elevator) for the tests to be done tomorrow  You will be contacted by phone if any changes need to be made immediately.  Otherwise, you will receive a letter about your results with an explanation, but please check with MyChart first.  Your form will be filled out

## 2013-03-26 NOTE — Assessment & Plan Note (Signed)
Etiology unclear, Exam otherwise benign, to check labs as documented, follow with expectant management  

## 2013-03-26 NOTE — Progress Notes (Signed)
   Subjective:    Patient ID: Russell Penner., male    DOB: [birth year], 43 y.o.   MRN: [id number]  HPI  Here to f/u, main c/o is unexplained general weakness and malaise, occas dizziness without fever, ST, cough, HA, and Pt denies chest pain, increased sob or doe, wheezing, orthopnea, PND, increased LE swelling, palpitations, dizziness or syncope.  He focuses on his BP meds and seems distressed, mentions "low BP" mult times regarding home BP 120's and 130's sbp. Has not actually started amlod 5 mg yet.  Has a type of spiritual provider who suggested he has adrenal problem.  Had normal cortisol mid 2014.  Wants an flmla form filled out for work missed recetnly. Past Medical History  Diagnosis Date  . BIPOLAR DISORDER UNSPECIFIED 04/18/2009    Qualifier: Diagnosis of  By: Jonny Ruiz MD, Len Blalock   . ASTHMA, CHILDHOOD 04/18/2009    Qualifier: Diagnosis of  By: Jonny Ruiz MD, Len Blalock    No past surgical history on file.  reports that he has never smoked. He does not have any smokeless tobacco history on file. He reports that he does not drink alcohol or use illicit drugs. family history includes Diabetes in his father; Hypertension in his father; Stroke in his father. No Known Allergies Current Outpatient Prescriptions on File Prior to Visit  Medication Sig Dispense Refill  . amLODipine (NORVASC) 5 MG tablet Take 1 tablet (5 mg total) by mouth daily.  90 tablet  3  . citalopram (CELEXA) 20 MG tablet Take 1 tablet (20 mg total) by mouth daily.  90 tablet  3  . irbesartan (AVAPRO) 300 MG tablet Take 1 tablet (300 mg total) by mouth daily.  90 tablet  3   No current facility-administered medications on file prior to visit.   Review of Systems  Constitutional: Negative for unexpected weight change, or unusual diaphoresis  HENT: Negative for tinnitus.   Eyes: Negative for photophobia and visual disturbance.  Respiratory: Negative for choking and stridor.   Gastrointestinal: Negative for vomiting and blood in  stool.  Genitourinary: Negative for hematuria and decreased urine volume.  Musculoskeletal: Negative for acute joint swelling Skin: Negative for color change and wound.  Neurological: Negative for tremors and numbness other than noted  Psychiatric/Behavioral: Negative for decreased concentration or  hyperactivity.       Objective:   Physical Exam BP 140/80  Pulse 59  Temp(Src) 98.4 F (36.9 C) (Oral)  Ht 5\' 7"  (1.702 m)  Wt 155 lb 2 oz (70.364 kg)  BMI 24.29 kg/m2  SpO2 97% VS noted, fatigued, non toxic Constitutional: Pt appears well-developed and well-nourished.  HENT: Head: NCAT.  Right Ear: External ear normal.  Left Ear: External ear normal.  Eyes: Conjunctivae and EOM are normal. Pupils are equal, round, and reactive to light.  Neck: Normal range of motion. Neck supple.  Cardiovascular: Normal rate and regular rhythm.   Pulmonary/Chest: Effort normal and breath sounds normal.  Abd:  Soft, NT, non-distended, + BS Neurological: Pt is alert. Not confused  Skin: Skin is warm. No erythema.  Psychiatric: Pt behavior is normal but 1+ nervous. Thought content normal.  - no clear delusional    Assessment & Plan:

## 2013-03-27 ENCOUNTER — Telehealth: Payer: Self-pay

## 2013-03-27 ENCOUNTER — Other Ambulatory Visit (INDEPENDENT_AMBULATORY_CARE_PROVIDER_SITE_OTHER): Payer: BC Managed Care – PPO

## 2013-03-27 DIAGNOSIS — R5381 Other malaise: Secondary | ICD-10-CM

## 2013-03-27 DIAGNOSIS — R5383 Other fatigue: Principal | ICD-10-CM

## 2013-03-27 DIAGNOSIS — Z0279 Encounter for issue of other medical certificate: Secondary | ICD-10-CM

## 2013-03-27 LAB — CBC WITH DIFFERENTIAL/PLATELET
Basophils Absolute: 0 10*3/uL (ref 0.0–0.1)
Basophils Relative: 0.4 % (ref 0.0–3.0)
Eosinophils Absolute: 0 10*3/uL (ref 0.0–0.7)
Eosinophils Relative: 0.8 % (ref 0.0–5.0)
HEMATOCRIT: 44.7 % (ref 39.0–52.0)
Hemoglobin: 14.9 g/dL (ref 13.0–17.0)
LYMPHS ABS: 1.2 10*3/uL (ref 0.7–4.0)
Lymphocytes Relative: 20.4 % (ref 12.0–46.0)
MCHC: 33.4 g/dL (ref 30.0–36.0)
MCV: 92 fl (ref 78.0–100.0)
MONO ABS: 0.5 10*3/uL (ref 0.1–1.0)
Monocytes Relative: 8 % (ref 3.0–12.0)
Neutro Abs: 4.2 10*3/uL (ref 1.4–7.7)
Neutrophils Relative %: 70.4 % (ref 43.0–77.0)
PLATELETS: 231 10*3/uL (ref 150.0–400.0)
RBC: 4.86 Mil/uL (ref 4.22–5.81)
RDW: 13.1 % (ref 11.5–14.6)
WBC: 5.9 10*3/uL (ref 4.5–10.5)

## 2013-03-27 LAB — BASIC METABOLIC PANEL
BUN: 11 mg/dL (ref 6–23)
CO2: 29 mEq/L (ref 19–32)
Calcium: 9.7 mg/dL (ref 8.4–10.5)
Chloride: 103 mEq/L (ref 96–112)
Creatinine, Ser: 0.8 mg/dL (ref 0.4–1.5)
GFR: 108.55 mL/min (ref >60.00)
GLUCOSE: 111 mg/dL — AB (ref 70–99)
POTASSIUM: 3.8 meq/L (ref 3.5–5.1)
SODIUM: 137 meq/L (ref 135–145)

## 2013-03-27 LAB — TSH: TSH: 0.73 u[IU]/mL (ref 0.35–5.50)

## 2013-03-27 LAB — URINALYSIS, ROUTINE W REFLEX MICROSCOPIC
BILIRUBIN URINE: NEGATIVE
Hgb urine dipstick: NEGATIVE
KETONES UR: NEGATIVE
LEUKOCYTES UA: NEGATIVE
Nitrite: NEGATIVE
TOTAL PROTEIN, URINE-UPE24: NEGATIVE
URINE GLUCOSE: NEGATIVE
Urobilinogen, UA: 0.2 (ref 0.0–1.0)
pH: 6 (ref 5.0–8.0)

## 2013-03-27 LAB — HEPATIC FUNCTION PANEL
ALBUMIN: 4 g/dL (ref 3.5–5.2)
ALK PHOS: 78 U/L (ref 39–117)
ALT: 35 U/L (ref 0–53)
AST: 20 U/L (ref 0–37)
BILIRUBIN TOTAL: 0.7 mg/dL (ref 0.3–1.2)
Bilirubin, Direct: 0.1 mg/dL (ref 0.0–0.3)
Total Protein: 6.6 g/dL (ref 6.0–8.3)

## 2013-03-27 LAB — SEDIMENTATION RATE: SED RATE: 14 mm/h (ref 0–22)

## 2013-03-27 LAB — CORTISOL: CORTISOL PLASMA: 17 ug/dL

## 2013-03-27 LAB — VITAMIN B12: VITAMIN B 12: 847 pg/mL (ref 211–911)

## 2013-03-27 LAB — TESTOSTERONE: TESTOSTERONE: 353.77 ng/dL (ref 350.00–890.00)

## 2013-03-27 NOTE — Telephone Encounter (Signed)
Patient called to inform took bp was 124/58 and pulse of 64.  I informed would certainly inform PCP, but would contact him if PCP felt these numbers a problem.

## 2013-03-27 NOTE — Telephone Encounter (Signed)
This is normal.  No need for any change

## 2013-03-27 NOTE — Telephone Encounter (Signed)
Patient informed. 

## 2013-03-28 LAB — VITAMIN D 25 HYDROXY (VIT D DEFICIENCY, FRACTURES): Vit D, 25-Hydroxy: 37 ng/mL (ref 30–89)

## 2013-03-31 ENCOUNTER — Ambulatory Visit: Payer: BC Managed Care – PPO | Admitting: Internal Medicine

## 2013-03-31 ENCOUNTER — Telehealth: Payer: Self-pay

## 2013-03-31 NOTE — Telephone Encounter (Signed)
No need to change medication based on these BP, which are normal  OK for OV now if he wants to check wt and orthostatics

## 2013-03-31 NOTE — Telephone Encounter (Signed)
Patient informed and agreed to schedule appointment.

## 2013-03-31 NOTE — Telephone Encounter (Signed)
Patient states on his scales at home as lost around 10 to 12 pounds, appetite has decreased, extremely weak, unable to go to work.  Patient is taking BP med. Correctly. BP  Monday 132/81, 121/66, 134/74, 130/72 and last night before bed 126/71.   The patient thinks that his BP is low.  Patient thinks he should reduce his medication.  Advise Please.

## 2013-04-01 ENCOUNTER — Ambulatory Visit (INDEPENDENT_AMBULATORY_CARE_PROVIDER_SITE_OTHER): Payer: BC Managed Care – PPO | Admitting: Internal Medicine

## 2013-04-01 ENCOUNTER — Encounter: Payer: Self-pay | Admitting: Internal Medicine

## 2013-04-01 VITALS — BP 132/82 | Temp 98.0°F | Wt 158.0 lb

## 2013-04-01 DIAGNOSIS — F319 Bipolar disorder, unspecified: Secondary | ICD-10-CM

## 2013-04-01 DIAGNOSIS — F411 Generalized anxiety disorder: Secondary | ICD-10-CM

## 2013-04-01 DIAGNOSIS — I10 Essential (primary) hypertension: Secondary | ICD-10-CM

## 2013-04-01 MED ORDER — IRBESARTAN 150 MG PO TABS: 150.0000 mg | ORAL_TABLET | Freq: Every day | ORAL | Status: DC

## 2013-04-01 NOTE — Progress Notes (Signed)
Pre visit review using our clinic review tool, if applicable. No additional management support is needed unless otherwise documented below in the visit note. 

## 2013-04-01 NOTE — Patient Instructions (Signed)
OK to decrease the avapro to 150 mg per day Please continue all other medications as before, and refills have been done if requested. Please have the pharmacy call with any other refills you may need.  You will be contacted regarding the referral for: psychiatry  Please call if you change your mind about taking Depakote in the meantime

## 2013-04-01 NOTE — Telephone Encounter (Signed)
Patient called again this morning around 10am.  Additional complaints from BP medication.  He states he is not sleeping as well as he did before taking this medication.  Takes longer to go to sleep, then wakes earlier and unable to go back to sleep.  Also hands and feet stay cold.

## 2013-04-01 NOTE — Telephone Encounter (Signed)
Patient left additional message this morning 04/01/13.  BP last night was 126/71, took BP medication and went to bed.  While sleeping heart rate increased and felt chest tightness, but did go away.  Patient states feels so faint at times.  He does understand his BP numbers are good for the average person, but thinks maybe simple too low for his body.  The patient has scheduled an appointment today 04/01/13 at 5:45 with PCP.

## 2013-04-01 NOTE — Progress Notes (Signed)
   Subjective:    Patient ID: Russell PennerBuddy Eslick Jr., male    DOB: Aug 05, 1970, 43 y.o.   MRN: [id number]  HPI  Here to f/u, still feels fatigue and drained when SBP gets to the 120's, not taking the celexa as he thought his spiritual friend got his "meridian back in line for the adrenal, kidneys and pitutary, and she said had some parasites on them."  Has seen psychiatry years ago, tried on bipolar med per Dr Donell BeersPlovsky, but declines at this time.  Denies worsening depressive symptoms, suicidal ideation, or panic; no agitation, hallucinations.  Has been working, but with recent weakness has not been working since last [employer].  His recent phone call menitions 10 lb wt loss (has actually gained 3 lbs).  Has difficulty staying asleep most nights recently, tends to wake up early Past Medical History  Diagnosis Date  . BIPOLAR DISORDER UNSPECIFIED 04/18/2009    Qualifier: Diagnosis of  By: Jonny RuizJohn MD, Len BlalockJames W   . ASTHMA, CHILDHOOD 04/18/2009    Qualifier: Diagnosis of  By: Jonny RuizJohn MD, Len BlalockJames W    No past surgical history on file.  reports that he has never smoked. He does not have any smokeless tobacco history on file. He reports that he does not drink alcohol or use illicit drugs. family history includes Diabetes in his father; Hypertension in his father; Stroke in his father. No Known Allergies Current Outpatient Prescriptions on File Prior to Visit  Medication Sig Dispense Refill  . amLODipine (NORVASC) 5 MG tablet Take 1 tablet (5 mg total) by mouth daily.  90 tablet  3  . citalopram (CELEXA) 20 MG tablet Take 1 tablet (20 mg total) by mouth daily.  90 tablet  3  . irbesartan (AVAPRO) 300 MG tablet Take 1 tablet (300 mg total) by mouth daily.  90 tablet  3   No current facility-administered medications on file prior to visit.    Review of Systems  Constitutional: Negative for unexpected weight change, or unusual diaphoresis  HENT: Negative for tinnitus.   Eyes: Negative for photophobia and visual disturbance.    Respiratory: Negative for choking and stridor.   Gastrointestinal: Negative for vomiting and blood in stool.  Genitourinary: Negative for hematuria and decreased urine volume.  Musculoskeletal: Negative for acute joint swelling Skin: Negative for color change and wound.  Neurological: Negative for tremors and numbness other than noted  Psychiatric/Behavioral: Negative for decreased concentration or  hyperactivity.       Objective:   Physical Exam BP 132/82  Temp(Src) 98 F (36.7 C) (Oral)  Wt 158 lb (71.668 kg)  SpO2 98% VS noted,  Constitutional: Pt appears well-developed and well-nourished.  HENT: Head: NCAT.  Right Ear: External ear normal.  Left Ear: External ear normal.  Eyes: Conjunctivae and EOM are normal. Pupils are equal, round, and reactive to light.  Neck: Normal range of motion. Neck supple.  Cardiovascular: Normal rate and regular rhythm.   Pulmonary/Chest: Effort normal and breath sounds normal.  Neurological: Pt is alert. Not confused  Skin: Skin is warm. No erythema. No LE edema Psychiatric: Pt behavior is normal. Thought content logical but dysphoric    Assessment & Plan:

## 2013-04-01 NOTE — Assessment & Plan Note (Signed)
Ok to decrease the avapro to 150 mg per day, but not clear this will improve his symptoms, which seem primarily emotional BP Readings from Last 3 Encounters:  04/01/13 132/82  03/26/13 140/80  03/17/13 140/90

## 2013-04-01 NOTE — Assessment & Plan Note (Signed)
Declines further celexa, ok for psychiatry referral, consider counseling as well

## 2013-04-01 NOTE — Telephone Encounter (Signed)
Ok to cont meds for now, to be addressed at next OV

## 2013-04-01 NOTE — Assessment & Plan Note (Signed)
i offered depakote which he declines, states he was told by a previous MD he didn't even need it per pt (dr Donell Beers- psychiatry)

## 2013-04-07 ENCOUNTER — Telehealth: Payer: Self-pay

## 2013-04-07 ENCOUNTER — Ambulatory Visit: Payer: BC Managed Care – PPO | Admitting: Internal Medicine

## 2013-04-07 NOTE — Telephone Encounter (Signed)
Patient called to inform went to work today, checked BP and was 115/66 pulse 51.  He would like to know is he should reduce his BP medication.

## 2013-04-07 NOTE — Telephone Encounter (Signed)
Patient also needs another work note excusing for missing work on 03/26/13, 03/28/13 and 03/31/13 and 04/01/13   And state to return to work on 04/02/13.  Fax to [phone]  ATTN: Paula/Diane personnel.

## 2013-04-07 NOTE — Telephone Encounter (Signed)
No , since BP was reduced last visit

## 2013-04-07 NOTE — Telephone Encounter (Signed)
Letter completed and faxed to number given by patient. Please advise on question by the patient regarding reducing his medication?

## 2013-04-07 NOTE — Telephone Encounter (Signed)
Ok for note; to robin to handle 

## 2013-04-07 NOTE — Telephone Encounter (Signed)
Called the patient informed of MD instruction on BP med, and informed him as well note for work has been faxed as requested

## 2013-04-10 ENCOUNTER — Telehealth: Payer: Self-pay

## 2013-04-10 NOTE — Telephone Encounter (Signed)
Second phone message the patient wanted PCP to be aware they gave the patient a prescription of prednisone 20 mg tid.

## 2013-04-10 NOTE — Telephone Encounter (Signed)
noted 

## 2013-04-10 NOTE — Telephone Encounter (Signed)
The patient left a message this am to inform he was seen at [facility] 04/09/13 for his recent symptoms seen here in our office.  Stated they did blood work, Personal assistant and CT of the head.  He stated they did see some "Activity" and are referring him to Dr. Adella Hare Neurologist in [place].  He wanted PCP to be aware and would have those notes faxed to our office.

## 2013-04-15 ENCOUNTER — Telehealth: Payer: Self-pay | Admitting: Internal Medicine

## 2013-04-15 DIAGNOSIS — R531 Weakness: Secondary | ICD-10-CM

## 2013-04-15 NOTE — Telephone Encounter (Signed)
The patient called this morning stating [facility] was to get him a referral to a neurologist in [facility].  He is now saying [facility] stating PCP needs to refer.   The patient would like an appointment with [facility] Neurological Dr. Pearlean Brownie

## 2013-04-15 NOTE — Telephone Encounter (Signed)
I would need to know a specific reason

## 2013-04-15 NOTE — Telephone Encounter (Signed)
Called the patient informed to go to the hospital where he was seen and have a release signed to have notes sent to Dr. Jonny RuizJohn,   Patient agreed to do so.

## 2013-04-15 NOTE — Telephone Encounter (Signed)
Patient informed. 

## 2013-04-15 NOTE — Telephone Encounter (Signed)
Ok for referral, but I would refer to a [place] neurologist with an interest in neuromuscular dz, (I cannot recall the name now, but this will be done); Dr Pearlean Brownie treats primariily stroke, and pt has not had a stroke

## 2013-04-24 DIAGNOSIS — Z0279 Encounter for issue of other medical certificate: Secondary | ICD-10-CM

## 2013-05-05 ENCOUNTER — Telehealth: Payer: Self-pay

## 2013-05-05 NOTE — Telephone Encounter (Signed)
No need for appt, as he has already been referred to neurology

## 2013-05-05 NOTE — Telephone Encounter (Signed)
The patient called to inform his company did received his paperwork from our office and claim was denied.  They stated that there was no treatment recommendations.  They suggested an OV with PCP to possible to a referral to a neurologist who then could order an MRI.  Please advise

## 2013-05-05 NOTE — Telephone Encounter (Signed)
Patient informed. 

## 2013-05-07 ENCOUNTER — Telehealth: Payer: Self-pay

## 2013-05-07 NOTE — Telephone Encounter (Signed)
Patient informed. 

## 2013-05-07 NOTE — Telephone Encounter (Signed)
The patient called this morning after taking his BP medication BP was 106/66.  States he is taking amlodipine 9 PM and isbesartan in the morning.  Advise [phone] call back number.Marland Kitchen..Marland Kitchen

## 2013-05-07 NOTE — Telephone Encounter (Signed)
Thanks for the information. Ok to cont meds as he is taking

## 2013-05-14 ENCOUNTER — Encounter: Payer: Self-pay | Admitting: Neurology

## 2013-05-14 ENCOUNTER — Ambulatory Visit (INDEPENDENT_AMBULATORY_CARE_PROVIDER_SITE_OTHER): Payer: BC Managed Care – PPO | Admitting: Neurology

## 2013-05-14 ENCOUNTER — Telehealth: Payer: Self-pay | Admitting: *Deleted

## 2013-05-14 VITALS — BP 134/80 | HR 66 | Ht 67.32 in | Wt 154.6 lb

## 2013-05-14 DIAGNOSIS — R5383 Other fatigue: Secondary | ICD-10-CM

## 2013-05-14 DIAGNOSIS — IMO0002 Reserved for concepts with insufficient information to code with codable children: Secondary | ICD-10-CM

## 2013-05-14 DIAGNOSIS — R209 Unspecified disturbances of skin sensation: Secondary | ICD-10-CM

## 2013-05-14 DIAGNOSIS — M5417 Radiculopathy, lumbosacral region: Secondary | ICD-10-CM | POA: Insufficient documentation

## 2013-05-14 DIAGNOSIS — R5381 Other malaise: Secondary | ICD-10-CM

## 2013-05-14 DIAGNOSIS — R531 Weakness: Secondary | ICD-10-CM

## 2013-05-14 DIAGNOSIS — R259 Unspecified abnormal involuntary movements: Secondary | ICD-10-CM

## 2013-05-14 DIAGNOSIS — R251 Tremor, unspecified: Secondary | ICD-10-CM

## 2013-05-14 LAB — CK: CK TOTAL: 26 U/L (ref 7–232)

## 2013-05-14 NOTE — Progress Notes (Signed)
See procedure note for EMG results.  Ashley Bultema K. Amalie Koran, DO  

## 2013-05-14 NOTE — Patient Instructions (Signed)
1.  MRI brain wwo contrast 2.  EMG of the right side 3.  Check myasthenia gravis panel, CK, aldolase 4.  Return to clinic in  6 weeks

## 2013-05-14 NOTE — Telephone Encounter (Signed)
Patient called wondering if he should stay out of work until after the MRI.  He says that he is worried he will have another episode and is also worried about lifting heavy objects and being on his feet all day.

## 2013-05-14 NOTE — Progress Notes (Signed)
[facility] Clinic Note - Initial Visit   Date: 05/14/2013    Lysbeth PennerBuddy Stober Jr. MRN: [id number] DOB: 1970-12-29   Dear Dr Jonny RuizJohn:  Thank you for your kind referral of Russell De BurrsSmith Jr. for consultation of generalized weakness. Although his history is well known to you, please allow us to reiterate it for the purpose of our medical record. The patient was accompanied to the clinic by mother who also provides collateral information.     History of Present Illness: Russell De BurrsSmith Jr. is a 43 y.o. right-handed Caucasian male with history of hypertension, bipolar disorder, asthma, and anxiety presenting for evaluation of generalized weakness and paresthesias.    In 2014, he developed a tingling sensation over his head and get "swimming headed", which lasted a few seconds and spontaneously resolved.  He felt that his blood pressure was going up so went to see. Dr. Jonny RuizJohn, his PCP.  Starting in February 2015, he developed tremors of the hands and bilateral leg weakness.  He feels that he gets fatigued easily. On March 5th 2015, he was at work and started shaking, lost his energy, felt as if his head was heavy, and was short of breath so called his mom who took him to [facility].  He has CT head and lab work was unrevealing.  He was discharged once he felt better and attributed this to being able to rest.  He also complains of seeing black spots and little spots in his eyes and left sided throbbing pain over his head, lasting about a minute.    He stopped working since March 5th 2015 and has been at home resting. He does very little activity and walks to the mailbox.  His mood is pretty good since being away from work because he is relax.    In late January, he reports having a upper respiratory infection for which he went to urgent care.  No other preceding stressors.   Out-side paper records, electronic medical record, and images have been reviewed where available and  summarized as:  CT head 04/09/2013:  Chronic microvascular changes.  Lab Results  Component Value Date   TSH 0.73 03/27/2013   Lab Results  Component Value Date   VITAMINB12 847 03/27/2013   Lab Results  Component Value Date   ALT 35 03/27/2013   AST 20 03/27/2013   ALKPHOS 78 03/27/2013   BILITOT 0.7 03/27/2013     Past Medical History  Diagnosis Date  . BIPOLAR DISORDER UNSPECIFIED 04/18/2009    Qualifier: Diagnosis of  By: Jonny RuizJohn MD, Len BlalockJames W   . ASTHMA, CHILDHOOD 04/18/2009    Qualifier: Diagnosis of  By: Jonny RuizJohn MD, Len BlalockJames W   . Hypertension   . Insomnia     History reviewed. No pertinent past surgical history.   Medications:  Current Outpatient Prescriptions on File Prior to Visit  Medication Sig Dispense Refill  . amLODipine (NORVASC) 5 MG tablet Take 1 tablet (5 mg total) by mouth daily.  90 tablet  3  . irbesartan (AVAPRO) 150 MG tablet Take 1 tablet (150 mg total) by mouth daily.  90 tablet  3   No current facility-administered medications on file prior to visit.    Allergies: No Known Allergies  Family History: Family History  Problem Relation Age of Onset  . Stroke Father   . Diabetes Father   . Hypertension Father     Social History: History   Social History  . Marital Status: Single  Spouse Name: N/A    Number of Children: N/A  . Years of Education: N/A   Occupational History  . Not on file.   Social History Main Topics  . Smoking status: Never Smoker   . Smokeless tobacco: Not on file  . Alcohol Use: No  . Drug Use: No  . Sexual Activity: Not on file   Other Topics Concern  . Not on file   Social History Narrative  . No narrative on file    Review of Systems:  CONSTITUTIONAL: No fevers, chills, night sweats, +weight loss.   EYES: +visual changes or eye pain ENT: No hearing changes.  No history of nose bleeds.   RESPIRATORY: No cough, wheezing and shortness of breath.   CARDIOVASCULAR: Negative for chest pain, and palpitations.   GI:  Negative for abdominal discomfort, blood in stools or black stools.  No recent change in bowel habits.   GU:  No history of incontinence.   MUSCLOSKELETAL: No history of joint pain or swelling.  No myalgias.   SKIN: Negative for lesions, rash, and itching.   HEMATOLOGY/ONCOLOGY: Negative for prolonged bleeding, bruising easily, and swollen nodes.  No history of cancer.   ENDOCRINE: Negative for cold or heat intolerance, polydipsia or goiter.   PSYCH:  +depression or anxiety symptoms.   NEURO: As Above.   Vital Signs:  BP 134/80  Pulse 66  Ht 5' 7.32" (1.71 m)  Wt 154 lb 9 oz (70.109 kg)  BMI 23.98 kg/m2  SpO2 97%   General Medical Exam:   General:  Well appearing, comfortable.   Eyes/ENT: see cranial nerve examination.   Neck: No masses appreciated.  Full range of motion without tenderness.   Respiratory:  Clear to auscultation, good air entry bilaterally.   Cardiac:  Regular rate and rhythm, no murmur.   Back:  No pain to palpation of spinous processes.   Extremities:  No deformities, edema, or skin discoloration. Good capillary refill.   Skin:  Skin color, texture, turgor normal. No rashes or lesions.  Neurological Exam: MENTAL STATUS including orientation to time, place, person, recent and remote memory, attention span and concentration, language, and fund of knowledge is normal.  Speech is not dysarthric.  CRANIAL NERVES: II:  No visual field defects.  Unremarkable fundi.   III-IV-VI: Pupils equal round and reactive to light.  Normal conjugate, extra-ocular eye movements in all directions of gaze.  No nystagmus.  No ptosis V:  Normal facial sensation.   VII:  Normal facial symmetry and movements.  No pathologic facial reflexes.  VIII:  Normal hearing and vestibular function.   IX-X:  Normal palatal movement.   XI:  Normal shoulder shrug and head rotation.   XII:  Normal tongue strength and range of motion, no deviation or fasciculation.  MOTOR:  No atrophy or  fasciculations.  Mild postural tremor of the right > left hands. No pronator drift.  Tone is normal.    Right Upper Extremity:    Left Upper Extremity:    Deltoid  5/5   Deltoid  5/5   Biceps  5/5   Biceps  5/5   Triceps  5/5   Triceps  5/5   Wrist extensors  5/5   Wrist extensors  5/5   Wrist flexors  5/5   Wrist flexors  5/5   Finger extensors  5/5   Finger extensors  5/5   Finger flexors  5/5   Finger flexors  5/5   Dorsal interossei  5/5  Dorsal interossei  5/5   Abductor pollicis  5/5   Abductor pollicis  5/5   Tone (Ashworth scale)  0  Tone (Ashworth scale)  0   Right Lower Extremity:    Left Lower Extremity:    Hip flexors  5/5   Hip flexors  5/5   Hip extensors  5/5   Hip extensors  5/5   Knee flexors  5/5   Knee flexors  5/5   Knee extensors  5/5   Knee extensors  5/5   Dorsiflexors  5/5   Dorsiflexors  5/5   Plantarflexors  5/5   Plantarflexors  5/5   Toe extensors  5/5   Toe extensors  5/5   Toe flexors  5/5   Toe flexors  5/5   Tone (Ashworth scale)  0  Tone (Ashworth scale)  0   MSRs:  Right                                                                 Left brachioradialis 2+  brachioradialis 2+  biceps 2+  biceps 2+  triceps 2+  triceps 2+  patellar 2+  patellar 2+  ankle jerk 2+  ankle jerk 2+  Hoffman no  Hoffman no  plantar response down  plantar response down   SENSORY:  Normal and symmetric perception of light touch, pinprick, vibration, and proprioception.  Romberg's sign absent.   COORDINATION/GAIT: Normal finger-to- nose-finger and heel-to-shin. Slight imprecision with finger tapping with variability of amplitude and rate.  Able to rise from a chair without using arms.  Gait narrow based and stable. Tandem and stressed gait intact.    IMPRESSION: Mr. Bomar is a 43 year-old gentleman presenting with several complaints including generalized weakness, paresthesias, vision changes, and headache.  When he was at the Emergency Department with CT brain was  performed, he was told based on his CT brain he could have MS, ALS, or early dementia.  I, unfortunately, do not have the images to review. Exam is notable for mild postural tremor and otherwise non-focal, which is most likely benign.  Given the fluctuating nature of his symptoms, I will check for demyelinating disease with MRI brain.  Labs and EMG will be ordered for his muscle fatigability and paresthesias.   His symptoms are very bothersome to him and I reassured him that based on his non-focal exam, my suspicion for something worrisome is low but we will investigate further as outlined below.    PLAN/RECOMMENDATIONS:  1.  MRI brain wwo contrast 2.  EMG of the right side 3.  Check myasthenia gravis panel, CK, aldolase, copper 4.  Return to clinic in  6 weeks   The duration of this appointment visit was 45 minutes of face-to-face time with the patient.  Greater than 50% of this time was spent in counseling, explanation of diagnosis, planning of further management, and coordination of care.   Thank you for allowing me to participate in patient's care.  If I can answer any additional questions, I would be pleased to do so.    Sincerely,    Aailyah Dunbar K. Allena Katz, DO

## 2013-05-14 NOTE — Telephone Encounter (Signed)
Workup is still in progress since I do not find any gross abnormalities on his exam, I think he should be able to return to work with lighter duties/schedule.   [name] K. Allena Katz, DO

## 2013-05-14 NOTE — Procedures (Signed)
[facility]  [address], [address]  [place], [place] [address] Tel: [phone] Fax:  [phone] Test Date:  05/14/2013  Patient: Russell Moses DOB: Feb 06, 1970 Physician: Nita Sickleonika Patel, DO  Sex: Male Height: 5\' 7"  Ref Phys: Nita Sickleatel, [name]  ID#: [id number] Temp: 33.1C Technician:    Patient Complaints: This is a 43 year-old gentleman with generalized weakness, paresthesias, vision chages, and headache.  NCV & EMG Findings: Extensive evaluation of the right upper and lower extremity reveals:  1. Normal median, ulnar, radial, sural and superficial peroneal sensory responses.  Median and lateral plantar responses are also within normal limits. 2. Normal median, ulnar, peroneal, and tibial motor responses. 3. 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   Impression: There is electrophysiological evidence of an intraspinal canal lesion (i.e. radiculopathy) affecting the L5 nerve root/segment bilaterally. Overall, these changes are mild in degree electrically and worse on the right side.  There is no evidence of a generalized sensorimotor polyneuropathy, cervical radiculopathy or diffuse myopathy.   ___________________________ Nita Sickleonika Patel, DO    Nerve Conduction Studies Anti Sensory Summary Table   Site NR Peak (ms) Norm Peak (ms) P-T Amp (V) Norm P-T Amp  Right Median Anti Sensory (2nd Digit)  Wrist    3.1 <3.4 41.1 >20  Right Radial Anti Sensory (Base 1st Digit)  Wrist    2.4 <2.7 29.1 >18  Right Sup Peroneal Anti Sensory (Ant Lat Mall)  12 cm    2.7 <4.5 8.0 >5  Right Sural Anti Sensory (Lat Mall)  Calf    4.2 <4.5 11.4 >5  Right Ulnar Anti Sensory (5th Digit)  Wrist    2.8 <3.1 61.1 >12   Motor Summary Table   Site NR Onset (ms) Norm Onset (ms) O-P Amp (mV) Norm O-P Amp Site1 Site2 Delta-0 (ms) Dist (cm) Vel (m/s) Norm Vel (m/s)  Right Median  Motor (Abd Poll Brev)  Wrist    3.0 <3.9 9.8 >6 Elbow Wrist 4.7 28.0 60 >50  Elbow    7.7  8.6         Right Peroneal Motor (Ext Dig Brev)  Ankle    3.8 <5.5 3.7 >3 B Fib Ankle 6.6 31.0 47 >40  B Fib    10.4  3.2  Poplt B Fib 1.6 10.0 63 >40  Poplt    12.0  3.0         Right Tibial Motor (Abd Hall Brev)  Ankle    3.8 <6.0 16.4 >8 Knee Ankle 8.0 38.0 48 >40  Knee    11.8  13.1         Right Ulnar Motor (Abd Dig Minimi)  Wrist    2.3 <3.1 12.3 >7 B Elbow Wrist 3.5 23.0 66 >50  B Elbow    5.8  12.3  A Elbow B Elbow 2.0 10.0 50 >50  A Elbow    7.8  12.1          Mixed Summary Table   Site NR Peak (ms) Norm Peak (ms) P-T Amp (V) Norm P-T Amp  Right Lateral Plantar Mixed (Med Malleolus)  Lateral Foot    2.8 <3.7 11.5 >8  Right Medial Plantar Mixed (Med Malleolus)  Medial Foot    2.9 <3.7 15.0 >8   H Reflex Studies   NR H-Lat (ms) Lat Norm (ms) L-R H-Lat (ms)  Right Tibial (Gastroc)     30.22 <35  EMG   Side Muscle Ins Act Fibs Psw Fasc Number Recrt Dur Dur. Amp Amp. Poly Poly. Comment  Right 1stDorInt Nml Nml Nml Nml Nml Nml Nml Nml Nml Nml Nml Nml N/A  Right PronatorTeres Nml Nml Nml Nml Nml Nml Nml Nml Nml Nml Nml Nml N/A  Right Ext Indicis Nml Nml Nml Nml Nml Nml Nml Nml Nml Nml Nml Nml N/A  Right Biceps Nml Nml Nml Nml Nml Nml Nml Nml Nml Nml Nml Nml N/A  Right Triceps Nml Nml Nml Nml Nml Nml Nml Nml Nml Nml Nml Nml N/A  Right Deltoid Nml Nml Nml Nml Nml Nml Nml Nml Nml Nml Nml Nml N/A  Right AntTibialis Nml Nml Nml Nml 1- Mod-R Some 1+ Few 1+ Nml Nml N/A  Right Gastroc Nml Nml Nml Nml 1- Mod-V Nml Nml Nml Nml Nml Nml N/A  Right Flex Dig Long Nml Nml Nml Nml 1- Mod-V Few 1+ Nml Nml Nml Nml N/A  Right RectFemoris Nml Nml Nml Nml Nml Nml Nml Nml Nml Nml Nml Nml N/A  Right GluteusMed Nml Nml Nml Nml Nml Nml Nml Nml Nml Nml Nml Nml N/A  Left AntTibialis Nml Nml Nml Nml 1- Mod-R Few 1+ Nml Nml Nml Nml N/A  Left Flex Dig Long Nml Nml Nml Nml 1- Mod-R Few 1+ Nml Nml Nml Nml N/A    Left Gastroc Nml Nml Nml Nml Nml Nml Nml Nml Nml Nml Nml Nml N/A      Waveforms:

## 2013-05-18 LAB — COPPER, SERUM: Copper: 91 ug/dL (ref 70–175)

## 2013-05-19 LAB — ALDOLASE: Aldolase: 4.5 U/L (ref <=8.1)

## 2013-05-20 ENCOUNTER — Telehealth: Payer: Self-pay | Admitting: *Deleted

## 2013-05-20 ENCOUNTER — Telehealth: Payer: Self-pay | Admitting: Neurology

## 2013-05-20 NOTE — Telephone Encounter (Signed)
Spoke with patient and informed him that he can still work maybe just doing lighter duties.  He had sent another message stating that the EMG made his right side feel better.  I asked him about that and he said it helped to a certain degree but he still has some muscle weakness.  He also said that it made the right side feel lighter.  He said left side still feels heavy.  I told him that we could schedule one for the left side if he is still having the symptoms per Dr. Allena Katz.  He said that he would call back if he decides to have another one.

## 2013-05-20 NOTE — Telephone Encounter (Signed)
Patient called back wanting to have EMG of left side.  Can I put in an order for one?

## 2013-05-20 NOTE — Telephone Encounter (Signed)
Spoke with pt and informed him of Dr. Eliane Decree suggestion.  See previous note.

## 2013-05-20 NOTE — Telephone Encounter (Signed)
Pt calling back regarding an EMG. Please call back at [phone] / Sherri S.

## 2013-05-21 ENCOUNTER — Other Ambulatory Visit: Payer: Self-pay | Admitting: *Deleted

## 2013-05-21 DIAGNOSIS — Z8739 Personal history of other diseases of the musculoskeletal system and connective tissue: Secondary | ICD-10-CM

## 2013-05-21 NOTE — Telephone Encounter (Signed)
Sure.  60-min.  Nikitta Sobiech K. Allena KatzPatel, DO

## 2013-05-21 NOTE — Telephone Encounter (Signed)
Requested for Russell Moses to schedule appt and notify pt.

## 2013-05-22 LAB — MYASTHENIA GRAVIS PANEL 2
ACETYLCHOLINE REC MOD AB: 3 %
Acetylcholine Rec Binding: <0.3 nmol/L
Aceytlcholine Rec Bloc Ab: <15 % of inhibition (ref <15)

## 2013-05-25 ENCOUNTER — Encounter: Payer: Self-pay | Admitting: *Deleted

## 2013-05-26 ENCOUNTER — Ambulatory Visit (HOSPITAL_COMMUNITY)
Admission: RE | Admit: 2013-05-26 | Discharge: 2013-05-26 | Disposition: A | Discharge status: Home / Self Care | Payer: BC Managed Care – PPO | Source: Ambulatory Visit | Attending: Neurology | Admitting: Neurology

## 2013-05-26 ENCOUNTER — Telehealth: Payer: Self-pay | Admitting: Neurology

## 2013-05-26 ENCOUNTER — Encounter: Payer: Self-pay | Admitting: Internal Medicine

## 2013-05-26 DIAGNOSIS — R531 Weakness: Secondary | ICD-10-CM

## 2013-05-26 DIAGNOSIS — R5383 Other fatigue: Principal | ICD-10-CM

## 2013-05-26 DIAGNOSIS — R251 Tremor, unspecified: Secondary | ICD-10-CM

## 2013-05-26 DIAGNOSIS — R5381 Other malaise: Secondary | ICD-10-CM | POA: Insufficient documentation

## 2013-05-26 LAB — CREATININE, SERUM: Creatinine, Ser: 0.79 mg/dL (ref 0.50–1.35)

## 2013-05-26 NOTE — Telephone Encounter (Signed)
Please advise 

## 2013-05-26 NOTE — Telephone Encounter (Signed)
Pt called again, has questions about med interactions. Please call back./ Sherri S. # [phone]

## 2013-05-26 NOTE — Telephone Encounter (Signed)
Pls let him know we can prescribe Valium 5mg , take 1 prior to MRI, may take second dose if needed.  Pls send Rx for only 2 tabs. Thanks.

## 2013-05-26 NOTE — Telephone Encounter (Signed)
Pt called stating that he could not do the MRI because he started to panic and  He needs Dr. Allena Katz to Rx him something to keep him clam through the MRI.

## 2013-05-27 ENCOUNTER — Other Ambulatory Visit: Payer: Self-pay | Admitting: *Deleted

## 2013-05-27 ENCOUNTER — Encounter: Payer: Self-pay | Admitting: Internal Medicine

## 2013-05-27 DIAGNOSIS — G609 Hereditary and idiopathic neuropathy, unspecified: Secondary | ICD-10-CM

## 2013-05-27 NOTE — Telephone Encounter (Signed)
Russell Moses to see above 

## 2013-05-27 NOTE — Telephone Encounter (Signed)
Patient was concerned that the Valuim would countereact with his blood pressure meds.

## 2013-06-01 ENCOUNTER — Telehealth: Payer: Self-pay | Admitting: Neurology

## 2013-06-01 NOTE — Telephone Encounter (Signed)
Results from [facility] rec'd:  MRI brain with and without contrast 05/28/2013:  Multiple lesions in the periventricular and juxtacortical white matter bilaterally, one of which enhances. The pattern and distribution of these lesions suggest demyelinating disease such as multiple sclerosis.  I have called and discussed these results with the patient and informed him that I want further imaging of his cervical spine with and without contrast. He will need to be premedicated claustrophobia and is requesting to have imaging performed at the same center. All questions were answered and he is scheduled to see me in followup next week.  Jamariya Davidoff K. Allena KatzPatel, DO

## 2013-06-02 ENCOUNTER — Telehealth: Payer: Self-pay | Admitting: Neurology

## 2013-06-02 ENCOUNTER — Other Ambulatory Visit: Payer: Self-pay | Admitting: *Deleted

## 2013-06-02 ENCOUNTER — Telehealth: Payer: Self-pay

## 2013-06-02 DIAGNOSIS — G379 Demyelinating disease of central nervous system, unspecified: Secondary | ICD-10-CM

## 2013-06-02 NOTE — Telephone Encounter (Signed)
Pt called wanting to clarify if Dr. Allena Katz wanted him to have a MRI of his spine or only his neck. Please call pt to confirm.

## 2013-06-02 NOTE — Telephone Encounter (Signed)
Patient had called left message stating thinks having problems with BP medication.  He is having nausea, cough, dizziness, fatigue and stomach problems. Advise if OV needed.

## 2013-06-02 NOTE — Telephone Encounter (Signed)
He is scheduled for April 30 at 8:15.  I will notify patient.

## 2013-06-02 NOTE — Telephone Encounter (Signed)
Pt notified of appointment time and date.  Order faxed to [facility].

## 2013-06-02 NOTE — Telephone Encounter (Signed)
Called the patient informed of MD instructions and did transfer to scheduler to change 06/16/13 appointment sooner if can.

## 2013-06-02 NOTE — Telephone Encounter (Signed)
Sorry, no , the BP medicine does not cause these symptoms  Please consider OV but these can be bodily symptoms related to anxiety as well

## 2013-06-02 NOTE — Telephone Encounter (Signed)
Informed patient that it is of the neck.

## 2013-06-03 ENCOUNTER — Other Ambulatory Visit: Payer: Self-pay | Admitting: *Deleted

## 2013-06-03 MED ORDER — DIAZEPAM 5 MG PO TABS: 5.0000 mg | ORAL_TABLET | Freq: Once | ORAL | Status: DC

## 2013-06-03 NOTE — Telephone Encounter (Signed)
Rx for valium faxed to [facility] per Dr. Allena KatzPatel.

## 2013-06-03 NOTE — Telephone Encounter (Signed)
Pt needs to have some valuim called in to [facility]

## 2013-06-11 ENCOUNTER — Ambulatory Visit (INDEPENDENT_AMBULATORY_CARE_PROVIDER_SITE_OTHER): Payer: BC Managed Care – PPO | Admitting: Internal Medicine

## 2013-06-11 ENCOUNTER — Encounter: Payer: Self-pay | Admitting: Internal Medicine

## 2013-06-11 VITALS — BP 132/80 | HR 57 | Temp 98.2°F | Ht 67.0 in | Wt 158.0 lb

## 2013-06-11 DIAGNOSIS — F411 Generalized anxiety disorder: Secondary | ICD-10-CM

## 2013-06-11 DIAGNOSIS — R5383 Other fatigue: Secondary | ICD-10-CM

## 2013-06-11 DIAGNOSIS — I10 Essential (primary) hypertension: Secondary | ICD-10-CM

## 2013-06-11 DIAGNOSIS — R5381 Other malaise: Secondary | ICD-10-CM

## 2013-06-11 MED ORDER — FLUOXETINE HCL 20 MG PO CAPS: 20.0000 mg | ORAL_CAPSULE | Freq: Every day | ORAL | Status: DC

## 2013-06-11 NOTE — Assessment & Plan Note (Signed)
For prozac start, try refer psychiatry again

## 2013-06-11 NOTE — Assessment & Plan Note (Signed)
stable overall by history and exam, recent data reviewed with pt, and pt to continue medical treatment as before,  to f/u any worsening symptoms or concerns BP Readings from Last 3 Encounters:  06/11/13 132/80  05/14/13 134/80  04/01/13 132/82

## 2013-06-11 NOTE — Progress Notes (Signed)
Pre visit review using our clinic review tool, if applicable. No additional management support is needed unless otherwise documented below in the visit note. 

## 2013-06-11 NOTE — Patient Instructions (Signed)
Please take all new medication as prescribed - the generic prozac 20 mg   Please call in 3-4 wks if you feel you could use the higher dose  Please continue all other medications as before, and refills have been done if requested. Please have the pharmacy call with any other refills you may need.  Please keep your appointments with your specialists as you have planned  Please return in 6 months, or sooner if needed

## 2013-06-11 NOTE — Assessment & Plan Note (Signed)
To cont neuro eval, has appt tomorrow

## 2013-06-11 NOTE — Progress Notes (Signed)
Subjective:    Patient ID: Russell Moses., male    DOB: 07/14/1970, 43 y.o.   MRN: [id number]  HPI  Here to f/u; overall doing ok, mother present,  Pt denies chest pain, increased sob or doe, wheezing, orthopnea, PND, increased LE swelling, palpitations, dizziness or syncope.  Pt denies polydipsia, polyuria, or low sugar symptoms such as weakness or confusion improved with po intake.  Pt denies new neurological symptoms such as new headache, or facial or extremity weakness or numbness.   Pt states overall good compliance with meds, has been trying to follow lower cholesterol diet, with wt overall stable,  but little exercise however  Feels occas nausea and coldness, runny nose he attributes to the medications.  His right side heaviness has improved since the EMG/NCS.    Has marked agoraphobia.  Has tried lexapro about 3-4 yrs ago but made him ?zoned out.  Past Medical History  Diagnosis Date  . BIPOLAR DISORDER UNSPECIFIED 04/18/2009    Qualifier: Diagnosis of  By: Jonny Ruiz MD, Len Blalock   . ASTHMA, CHILDHOOD 04/18/2009    Qualifier: Diagnosis of  By: Jonny Ruiz MD, Len Blalock   . Hypertension   . Insomnia    Past Surgical History  Procedure Laterality Date  . Hernia repair      reports that he has never smoked. He does not have any smokeless tobacco history on file. He reports that he does not drink alcohol or use illicit drugs. family history includes COPD in his sister; Diabetes in his father; Fibromyalgia in his sister; Healthy in his sister; Heart disease in his father; Hypertension in his father and mother; Stroke in his father. No Known Allergies Current Outpatient Prescriptions on File Prior to Visit  Medication Sig Dispense Refill  . amLODipine (NORVASC) 5 MG tablet Take 1 tablet (5 mg total) by mouth daily.  90 tablet  3  . irbesartan (AVAPRO) 150 MG tablet Take 1 tablet (150 mg total) by mouth daily.  90 tablet  3   No current facility-administered medications on file prior to visit.    Review of Systems  Constitutional: Negative for unusual diaphoresis or other sweats  HENT: Negative for ringing in ear Eyes: Negative for double vision or worsening visual disturbance.  Respiratory: Negative for choking and stridor.   Gastrointestinal: Negative for vomiting or other signifcant bowel change Genitourinary: Negative for hematuria or decreased urine volume.  Musculoskeletal: Negative for other MSK pain or swelling Skin: Negative for color change and worsening wound.  Neurological: Negative for tremors and numbness other than noted  Psychiatric/Behavioral: Negative for decreased concentration or agitation other than above       Objective:   Physical Exam BP 132/80  Pulse 57  Temp(Src) 98.2 F (36.8 C) (Oral)  Ht 5\' 7"  (1.702 m)  Wt 158 lb (71.668 kg)  BMI 24.74 kg/m2  SpO2 97% VS noted,  Constitutional: Pt appears well-developed, well-nourished.  HENT: Head: NCAT.  Right Ear: External ear normal.  Left Ear: External ear normal.  Eyes: . Pupils are equal, round, and reactive to light. Conjunctivae and EOM are normal Neck: Normal range of motion. Neck supple.  Cardiovascular: Normal rate and regular rhythm.   Pulmonary/Chest: Effort normal and breath sounds normal.  Abd:  Soft, NT, ND, + BS Neurological: Pt is alert. Not confused , motor grossly intact Skin: Skin is warm. No rash Psychiatric: Pt behavior is normal. No agitation. 2+ nervous,. Not depressed affect    Assessment & Plan:

## 2013-06-12 ENCOUNTER — Encounter: Payer: Self-pay | Admitting: Neurology

## 2013-06-12 ENCOUNTER — Ambulatory Visit (INDEPENDENT_AMBULATORY_CARE_PROVIDER_SITE_OTHER): Payer: BC Managed Care – PPO | Admitting: Neurology

## 2013-06-12 VITALS — BP 144/78 | HR 60 | Ht 67.32 in | Wt 157.3 lb

## 2013-06-12 DIAGNOSIS — G379 Demyelinating disease of central nervous system, unspecified: Secondary | ICD-10-CM

## 2013-06-12 NOTE — Progress Notes (Signed)
Follow-up Visit   Date: 06/12/2013    Russell Moses. MRN: [id number] DOB: 04-Oct-1970   Interim History: Russell Moses. is a 43 y.o. right-handed Caucasian male with history of hypertension, bipolar disorder, asthma, and anxiety returning to the clinic for follow-up of generalized weakness and paresthesias.  The patient was accompanied to the clinic by mother who also provides collateral information.  He was last seen in the clinic on 05/14/2013.  History of present illness: In 2014, he developed a tingling sensation over his head and get "swimming headed", which lasted a few seconds and spontaneously resolved. He felt that his blood pressure was going up so went to see. [name], his PCP. Starting in February 2015, he developed tremors of the hands and bilateral leg weakness. He feels that he gets fatigued easily. On March 5th 2015, he was at work and started shaking, lost his energy, felt as if his head was heavy, and was short of breath so called his mom who took him to [facility]. He has CT head and lab work was unrevealing. He was discharged once he felt better.  He also complains of seeing black spots and little spots in his eyes and left sided throbbing pain over his head, lasting about a minute.  He stopped working since March 5th 2015 and has been at home resting. He does very little activity and walks to the mailbox. His mood is pretty good since being away from work because he is relax.    - Follow-up 06/12/2013:  He return to the clinic today to go over his recent testing.  He had EMG performed of the right side showed mild L5 radiculopathy bilaterally.  No evidence of MG or myopathy on his lab testing.  Interesting, he reported having relief of some of the heaviness after the EMG. His MRI brain wwo contrast shows white matter changes concerning for demyelinating disease.  Since his last visit, he shakiness of the hands, episodic weakness, and fatigues quickly.  He has  noticed that symptoms are worse with stress and in warmer temperatures.  He denies any history of vision loss.  He has developed new agoraphobia and is scheduled to see a psychiatrist for this.  He is not working at this time.    Medications:  Current Outpatient Prescriptions on File Prior to Visit  Medication Sig Dispense Refill  . amLODipine (NORVASC) 5 MG tablet Take 1 tablet (5 mg total) by mouth daily.  90 tablet  3  . FLUoxetine (PROZAC) 20 MG capsule Take 1 capsule (20 mg total) by mouth daily.  90 capsule  3  . irbesartan (AVAPRO) 150 MG tablet Take 1 tablet (150 mg total) by mouth daily.  90 tablet  3   No current facility-administered medications on file prior to visit.    Allergies: No Known Allergies   Review of Systems:  CONSTITUTIONAL: No fevers, chills, night sweats, or weight loss.   EYES: No visual changes or eye pain ENT: No hearing changes.  No history of nose bleeds.   RESPIRATORY: No cough, wheezing and shortness of breath.   CARDIOVASCULAR: Negative for chest pain, and palpitations.   GI: Negative for abdominal discomfort, blood in stools or black stools.  No recent change in bowel habits.   GU:  No history of incontinence.   MUSCLOSKELETAL: No history of joint pain or swelling.  No myalgias.   SKIN: Negative for lesions, rash, and itching.   ENDOCRINE: Negative for cold or heat  intolerance, polydipsia or goiter.   PSYCH:  ++ depression or anxiety symptoms.   NEURO: As Above.   Vital Signs:  BP 144/78  Pulse 60  Ht 5' 7.32" (1.71 m)  Wt 157 lb 5 oz (71.356 kg)  BMI 24.40 kg/m2  SpO2 97%   Neurological Exam: MENTAL STATUS including orientation to time, place, person, recent and remote memory, attention span and concentration, language, and fund of knowledge is normal.  Speech is not dysarthric.  CRANIAL NERVES: No visual field defects. Pupils equal round and reactive to light.  Normal conjugate, extra-ocular eye movements in all directions of gaze.   Subtle right ptosis. Normal facial sensation.  Face is symmetric. Palate elevates symmetrically.  Tongue is midline.  MOTOR:  Motor strength is 5/5 in all extremities.  Mild postural tremor of the right > left hands. No pronator drift.  Tone is normal.    MSRs:   Right      Left  brachioradialis  2+   brachioradialis  2+   biceps  2+   biceps  3+   triceps  2+   triceps  2+   patellar  3+   patellar  2+   ankle jerk  2+   ankle jerk  2+   Hoffman  no   Hoffman  no   plantar response  up  plantar response  down    SENSORY:  Intact to vibration and light touch throughout.  COORDINATION/GAIT:  Normal finger-to- nose-finger and heel-to-shin.  Intact rapid alternating movements bilaterally.  Gait narrow based and stable.   Data: CT head 04/09/2013: Chronic microvascular changes.  MRI brain wwo contrast 05/28/2013:  Multiple lesions in the periventricular and juxtacortical white matter bilaterally, one of which enhances.  The pattern and distribution of these lesions suggest demyelinating disease such as multiple sclerosis.   MRI cervical spine wwo contrast 06/04/2013:  Normal MRI cervical spine   EMG right upper and lower extremity 05/14/2013: There is electrophysiological evidence of an intraspinal canal lesion (i.e. radiculopathy) affecting the L5 nerve root/segment bilaterally. Overall, these changes are mild in degree electrically and worse on the right side.   There is no evidence of a generalized sensorimotor polyneuropathy, cervical radiculopathy or diffuse myopathy.   Labs 03/27/2013:  TSH 0.73, B12 847, ESR 14, vitamin D 37 Labs 05/14/2013:  CK 26, aldolase 26, copper 91, MG panel negative   IMPRESSION: Russell Moses is a 43 year-old gentleman returning to the clinic with tremors and generalized fatigue.  His work-up thus shows white matter changes concerning with demyelinating disease.  Disease burden involves periventricular and juxtacortical white matter bilaterally, one of which  enhances.  Cervical cord is normal.  I discussed that lumbar pucnture testing is the next step to look for inflammatory changes in the CSF which would be supportive of multiple sclerosis.  If indeed, findings are suggestive of demyelinating disease, I explained that I would treat him with solumedrol 1g x 3 days. Images were personally reviewed with the patient and her mother. I gave the patient and family the opportunity to ask questions, and I answered them to the best of my ability.    PLAN/RECOMMENDATIONS:  1.  Lumbar puncture, check CSF cell count and diff, protein, glucose, routine cultures, fungal cultures, IgG index, oligoclonal bands, myelin basic protein, flow cytology, flow cytometry (cytospin), ACE, CSF lyme PCR 2.  Start vitamin D 4000 units daily  3.  Encouraged him to stay cool 4.  Return to clinic in 3-4 weeks  The duration of this appointment visit was 40 minutes of face-to-face time with the patient.  Greater than 50% of this time was spent in counseling, explanation of diagnosis, planning of further management, and coordination of care.   Thank you for allowing me to participate in patient's care.  If I can answer any additional questions, I would be pleased to do so.    Sincerely,    [name] K. Posey Pronto, DO

## 2013-06-12 NOTE — Patient Instructions (Addendum)
1.  Will proceed with lumbar puncture 2.  Start vitamin D 4000 units daily 3.  Return to 3-4 weeks

## 2013-06-16 ENCOUNTER — Other Ambulatory Visit: Payer: Self-pay | Admitting: Radiology

## 2013-06-16 ENCOUNTER — Ambulatory Visit: Payer: BC Managed Care – PPO | Admitting: Internal Medicine

## 2013-06-19 ENCOUNTER — Encounter (HOSPITAL_COMMUNITY): Payer: Self-pay | Admitting: Pharmacist

## 2013-06-22 ENCOUNTER — Ambulatory Visit (HOSPITAL_COMMUNITY)
Admission: RE | Admit: 2013-06-22 | Discharge: 2013-06-22 | Disposition: A | Discharge status: Home / Self Care | Payer: BC Managed Care – PPO | Source: Ambulatory Visit | Attending: Neurology | Admitting: Neurology

## 2013-06-22 VITALS — BP 129/73 | HR 62 | Temp 98.3°F | Resp 18 | Ht 67.5 in | Wt 153.0 lb

## 2013-06-22 DIAGNOSIS — G379 Demyelinating disease of central nervous system, unspecified: Secondary | ICD-10-CM | POA: Insufficient documentation

## 2013-06-22 LAB — CSF CELL COUNT WITH DIFFERENTIAL
Eosinophils, CSF: NONE SEEN % (ref 0–1)
RBC Count, CSF: 0 /mm3
SEGMENTED NEUTROPHILS-CSF: NONE SEEN % (ref 0–6)
Tube #: 3
WBC, CSF: 0 /mm3 (ref 0–5)

## 2013-06-22 LAB — PROTEIN, CSF: Total  Protein, CSF: 27 mg/dL (ref 15–45)

## 2013-06-22 LAB — GLUCOSE, CSF: GLUCOSE CSF: 61 mg/dL (ref 43–76)

## 2013-06-22 MED ORDER — ACETAMINOPHEN 500 MG PO TABS
1000.0000 mg | ORAL_TABLET | Freq: Four times a day (QID) | ORAL | Status: DC | PRN
  Filled 2013-06-22: qty 2

## 2013-06-22 NOTE — Discharge Instructions (Signed)
Lumbar Puncture Discharge Instructions ° °1. Go home and rest quietly for the next 24 hours.  It is important to lie flat for the next 24 hours.  Get up only to go to the restroom.  You may lie in the bed or on a couch on your back, your stomach, your left side or your right side.  You may have one pillow under your head.  You may have pillows between your knees while you are on your side or under your knees while you are on your back. ° °2. DO NOT drive today.  Recline the seat as far back as it will go, while still wearing your seat belt, on the way home. ° °3. You may get up to go to the bathroom as needed.  You may sit up for 10 minutes to eat.  You may resume your normal diet and medications unless otherwise indicated. ° °4. The incidence of headache, nausea, or vomiting is about 5% (one in 20 patients).  If you develop a headache, lie flat and drink plenty of fluids until the headache goes away.  Caffeinated beverages may be helpful.  If you develop severe nausea and vomiting or a headache that does not go away with flat bed rest, call the office. ° °5. You may resume normal activities after your 24 hours of bed rest is over; however, do not exert yourself strongly or do any heavy lifting tomorrow. ° °6. Call your physician for a follow-up appointment.  The results of your myelogram will be sent directly to your physician by the following day. ° °7. If you have any questions or if complications develop after you arrive home, please call the office. ° °Discharge instructions have been explained to the patient.  The patient, or the person responsible for the patient, fully understands these instructions. ° ° °

## 2013-06-23 ENCOUNTER — Telehealth: Payer: Self-pay

## 2013-06-23 ENCOUNTER — Telehealth: Payer: Self-pay | Admitting: Neurology

## 2013-06-23 NOTE — Telephone Encounter (Signed)
Informed patient that not all of the results are back yet but as soon as they come in we will get back with him.  Patient agreed with plan.

## 2013-06-23 NOTE — Telephone Encounter (Signed)
Pt called wanting to confirm how long the results will take for his spinal tap and his lab work to come in. Please call pt to inform.

## 2013-06-23 NOTE — Telephone Encounter (Signed)
The patient did get his referral for psychiatry, but the provider is not  Covered under his insurance.   Please do referral to Dr. Gypsy Balsam at [facility].

## 2013-06-23 NOTE — Telephone Encounter (Signed)
Ok to forward to [employer]

## 2013-06-24 LAB — MYELIN BASIC PROTEIN, CSF

## 2013-06-24 NOTE — Telephone Encounter (Signed)
Will forward to [facility].

## 2013-06-25 LAB — ANGIOTENSIN CONVERTING ENZYME, CSF: Angio Convert Enzyme: 4 U/L (ref <=15)

## 2013-06-26 LAB — CSF IGG: IGG CSF: 1.6 mg/dL (ref 0.8–7.7)

## 2013-06-27 LAB — OLIGOCLONAL BANDS, CSF + SERM

## 2013-06-30 ENCOUNTER — Telehealth: Payer: Self-pay | Admitting: Neurology

## 2013-06-30 ENCOUNTER — Telehealth: Payer: Self-pay

## 2013-06-30 ENCOUNTER — Ambulatory Visit: Payer: BC Managed Care – PPO | Admitting: Neurology

## 2013-06-30 ENCOUNTER — Encounter: Payer: Self-pay | Admitting: Internal Medicine

## 2013-06-30 NOTE — Telephone Encounter (Signed)
Patient informed of note instructions.  The patient would like response on question regarding prozac, he also states he believes his BP is getting higher due to prozac, this am was 142/82

## 2013-06-30 NOTE — Telephone Encounter (Signed)
The patient called stating he thinks the prozac is causing him to have jitters and sleep problems

## 2013-06-30 NOTE — Telephone Encounter (Signed)
A user error has taken place: encounter opened in error, closed for administrative reasons.

## 2013-06-30 NOTE — Telephone Encounter (Signed)
Patient informed. 

## 2013-06-30 NOTE — Telephone Encounter (Signed)
Called and discussed results of CSF testing which is most suggestive of demyelinating disease, likely MS.  I would like for him to get Solumedrol 1000mg  IV x 3 days which will be arranged by my office.  All questions were answered.  Also mentioned that his paperwork was rec'd and I will try to have it completed by tomorrow.  [name] K. Allena Katz, DO

## 2013-06-30 NOTE — Telephone Encounter (Signed)
Left message informing patient that I will be setting this up for him and will call him with appointment times and dates.

## 2013-06-30 NOTE — Telephone Encounter (Signed)
Sorry, as he was referred some time ago and not gone;  Pt will need to see psychiatry who can handle any work note from there

## 2013-06-30 NOTE — Telephone Encounter (Signed)
The patient received fax regarding information on agoraphobia.  He is now requesting a work note until he is able to see his psychiatrist.

## 2013-06-30 NOTE — Telephone Encounter (Signed)
This should be eval and tx per psychiatry

## 2013-06-30 NOTE — Telephone Encounter (Signed)
Prozac does not affect BP, ok to cont as he is taking. thanks

## 2013-06-30 NOTE — Telephone Encounter (Signed)
The patient called back to inquire what to do about his sleeping problems and being so nervous?

## 2013-06-30 NOTE — Telephone Encounter (Signed)
Pt called following up on the [facility] paper work he faxed over. Please call pt to confirm.

## 2013-07-01 ENCOUNTER — Other Ambulatory Visit: Payer: Self-pay | Admitting: *Deleted

## 2013-07-01 DIAGNOSIS — R531 Weakness: Secondary | ICD-10-CM

## 2013-07-01 DIAGNOSIS — R202 Paresthesia of skin: Secondary | ICD-10-CM

## 2013-07-01 DIAGNOSIS — G379 Demyelinating disease of central nervous system, unspecified: Secondary | ICD-10-CM

## 2013-07-01 DIAGNOSIS — Z0279 Encounter for issue of other medical certificate: Secondary | ICD-10-CM

## 2013-07-01 LAB — B. BURGDORFI ANTIBODIES, CSF: Lyme Ab: NEGATIVE

## 2013-07-01 MED ORDER — METHYLPREDNISOLONE SODIUM SUCC 1000 MG IJ SOLR
1000.0000 mg | Freq: Every day | INTRAMUSCULAR | Status: AC
Start: 2013-07-01 — End: 2013-07-04

## 2013-07-01 NOTE — Telephone Encounter (Signed)
Patient informed. 

## 2013-07-01 NOTE — Telephone Encounter (Signed)
Patient informed that IV therapy will be starting tomorrow.  The schedule will be as follows: May 28 at 8:00 at [facility] May 29 at 11:00 at [facility] May 30 will be at [facility].  They will let him know on Friday what time to go.

## 2013-07-02 ENCOUNTER — Encounter (HOSPITAL_COMMUNITY)
Admission: RE | Admit: 2013-07-02 | Discharge: 2013-07-02 | Disposition: A | Discharge status: Home / Self Care | Payer: BC Managed Care – PPO | Source: Ambulatory Visit | Attending: Neurology | Admitting: Neurology

## 2013-07-02 ENCOUNTER — Other Ambulatory Visit (HOSPITAL_COMMUNITY): Payer: Self-pay | Admitting: *Deleted

## 2013-07-02 DIAGNOSIS — G35 Multiple sclerosis: Secondary | ICD-10-CM | POA: Diagnosis not present

## 2013-07-02 MED ORDER — SODIUM CHLORIDE 0.9 % IV SOLN
1000.0000 mg | Freq: Every day | INTRAVENOUS | Status: DC
  Administered 2013-07-02: 1000 mg via INTRAVENOUS
  Filled 2013-07-02: qty 8

## 2013-07-02 NOTE — Discharge Instructions (Signed)
Methylprednisolone Solution for Injection What is this medicine? METHYLPREDNISOLONE (meth ill pred NISS oh lone) is a corticosteroid. It is commonly used to treat inflammation of the skin, joints, lungs, and other organs. Common conditions treated include asthma, allergies, and arthritis. It is also used for other conditions, such as blood disorders and diseases of the adrenal glands. This medicine may be used for other purposes; ask your health care provider or pharmacist if you have questions. COMMON BRAND NAME(S): A-Methapred, Solu-Medrol What should I tell my health care provider before I take this medicine? They need to know if you have any of these conditions: -cataracts or glaucoma -Cushing's syndrome -heart disease -high blood pressure -infection including tuberculosis -low calcium or potassium levels in the blood -recent surgery -seizures -stomach or intestinal disease, including colitis -threadworms -thyroid problems -an unusual or allergic reaction to methylprednisolone, corticosteroids, benzyl alcohol, other medicines, foods, dyes, or preservatives -pregnant or trying to get pregnant -breast-feeding How should I use this medicine? This medicine is for injection or infusion into a vein. It is also for injection into a muscle. It is given by a health care professional in a hospital or clinic setting. Talk to your pediatrician regarding the use of this medicine in children. While this drug may be prescribed for selected conditions, precautions do apply. Overdosage: If you think you have taken too much of this medicine contact a poison control center or emergency room at once. NOTE: This medicine is only for you. Do not share this medicine with others. What if I miss a dose? This does not apply. What may interact with this medicine? Do not take this medicine with any of the following medications: -mifepristone This medicine may also interact with the following  medications: -aspirin and aspirin-like medicines -cyclosporin -ketoconazole -phenobarbital -phenytoin -rifampin -tacrolimus -troleandomycin -vaccines -warfarin This list may not describe all possible interactions. Give your health care provider a list of all the medicines, herbs, non-prescription drugs, or dietary supplements you use. Also tell them if you smoke, drink alcohol, or use illegal drugs. Some items may interact with your medicine. What should I watch for while using this medicine? Visit your doctor or health care professional for regular checks on your progress. If you are taking this medicine for a long time, carry an identification card with your name and address, the type and dose of your medicine, and your doctor's name and address. The medicine may increase your risk of getting an infection. Stay away from people who are sick. Tell your doctor or health care professional if you are around anyone with measles or chickenpox. You may need to avoid some vaccines. Talk to your health care provider for more information. If you are going to have surgery, tell your doctor or health care professional that you have taken this medicine within the last twelve months. Ask your doctor or health care professional about your diet. You may need to lower the amount of salt you eat. The medicine can increase your blood sugar. If you are a diabetic check with your doctor if you need help adjusting the dose of your diabetic medicine. What side effects may I notice from receiving this medicine? Side effects that you should report to your doctor or health care professional as soon as possible: -allergic reactions like skin rash, itching or hives, swelling of the face, lips, or tongue -bloody or tarry stools -changes in vision -eye pain or bulging eyes -fever, sore throat, sneezing, cough, or other signs of infection, wounds that will   not heal -increased thirst -irregular heartbeat -muscle  cramps -pain in hips, back, ribs, arms, shoulders, or legs -swelling of the ankles, feet, hands -trouble passing urine or change in the amount of urine -unusual bleeding or bruising -unusually weak or tired -weight gain or weight loss Side effects that usually do not require medical attention (report to your doctor or health care professional if they continue or are bothersome): -changes in emotions or moods -constipation or diarrhea -headache -irritation at site where injected -nausea, vomiting -skin problems, acne, thin and shiny skin -trouble sleeping -unusual hair growth on the face or body This list may not describe all possible side effects. Call your doctor for medical advice about side effects. You may report side effects to FDA at 1-800-FDA-1088. Where should I keep my medicine? This drug is given in a hospital or clinic and will not be stored at home. NOTE: This sheet is a summary. It may not cover all possible information. If you have questions about this medicine, talk to your doctor, pharmacist, or health care provider.  2014, [employer]vier/[employer] Standard. (2011-10-23 11:37:16)  

## 2013-07-03 ENCOUNTER — Telehealth: Payer: Self-pay

## 2013-07-03 ENCOUNTER — Encounter (HOSPITAL_COMMUNITY)
Admission: RE | Admit: 2013-07-03 | Discharge: 2013-07-03 | Disposition: A | Discharge status: Home / Self Care | Payer: BC Managed Care – PPO | Source: Ambulatory Visit | Attending: Neurology | Admitting: Neurology

## 2013-07-03 DIAGNOSIS — G35 Multiple sclerosis: Secondary | ICD-10-CM | POA: Diagnosis not present

## 2013-07-03 MED ORDER — PANTOPRAZOLE SODIUM 40 MG PO TBEC: 40.0000 mg | DELAYED_RELEASE_TABLET | Freq: Every day | ORAL | Status: DC

## 2013-07-03 MED ORDER — SODIUM CHLORIDE 0.9 % IV SOLN
1000.0000 mg | Freq: Every day | INTRAVENOUS | Status: DC
  Administered 2013-07-03: 1000 mg via INTRAVENOUS
  Filled 2013-07-03: qty 8

## 2013-07-03 NOTE — Progress Notes (Signed)
Patient and mother state that patient felt weak, tired and was pale after he went home yesterday, but that he has had these syptoms since he has been ill and prior to taking the solumedrol. Symptoms subsided through evening and night and are gone now.

## 2013-07-03 NOTE — Telephone Encounter (Signed)
Patient informed script sent to his pharmacy.

## 2013-07-03 NOTE — Telephone Encounter (Signed)
The patient would like to know what he can take for nausea and an antacid while taking his BP med.?

## 2013-07-03 NOTE — Telephone Encounter (Signed)
Ok for PPI - done erx

## 2013-07-04 ENCOUNTER — Encounter (HOSPITAL_COMMUNITY)
Admission: RE | Admit: 2013-07-04 | Payer: BC Managed Care – PPO | Source: Ambulatory Visit | Attending: Nephrology | Admitting: Nephrology

## 2013-07-04 ENCOUNTER — Observation Stay (HOSPITAL_COMMUNITY)
Admission: RE | Admit: 2013-07-04 | Discharge: 2013-07-04 | Disposition: A | Discharge status: Home / Self Care | Payer: BC Managed Care – PPO | Source: Ambulatory Visit | Attending: Neurology | Admitting: Neurology

## 2013-07-04 DIAGNOSIS — G379 Demyelinating disease of central nervous system, unspecified: Principal | ICD-10-CM | POA: Insufficient documentation

## 2013-07-04 MED ORDER — METHYLPREDNISOLONE SODIUM SUCC 1000 MG IJ SOLR
1000.0000 mg | Freq: Once | INTRAMUSCULAR | Status: AC
  Administered 2013-07-04: 1000 mg via INTRAVENOUS
  Filled 2013-07-04: qty 8

## 2013-07-04 NOTE — Progress Notes (Signed)
Pt tolerated infusion well.  VS were stable.  IV removed and intact. Levester Fresh, RN

## 2013-07-07 ENCOUNTER — Telehealth: Payer: Self-pay

## 2013-07-07 MED ORDER — OMEPRAZOLE 20 MG PO CPDR: DELAYED_RELEASE_CAPSULE | ORAL | Status: DC

## 2013-07-07 NOTE — Telephone Encounter (Signed)
Patient informed of change. 

## 2013-07-07 NOTE — Telephone Encounter (Signed)
Received PA for pantoprazole.  Advise

## 2013-07-07 NOTE — Telephone Encounter (Signed)
Ok to change to omeprazole 20 mg - 2 qd

## 2013-07-14 ENCOUNTER — Telehealth: Payer: Self-pay | Admitting: Neurology

## 2013-07-14 NOTE — Telephone Encounter (Signed)
Dr. Viviann Spare Broom's office called regarding this patient. He is a Product/process development scientist w/ [employer] and is calling about patient's disability claim. Please call back at [phone] / Sherri S.

## 2013-07-14 NOTE — Telephone Encounter (Signed)
Patient called back to inform pharmacist informed OTC would be less expensive.  The patient stated if ok with MD would do OTC.

## 2013-07-15 ENCOUNTER — Telehealth: Payer: Self-pay | Admitting: Neurology

## 2013-07-15 ENCOUNTER — Ambulatory Visit (INDEPENDENT_AMBULATORY_CARE_PROVIDER_SITE_OTHER): Payer: BC Managed Care – PPO | Admitting: Neurology

## 2013-07-15 ENCOUNTER — Encounter: Payer: Self-pay | Admitting: Neurology

## 2013-07-15 VITALS — BP 124/78 | HR 67 | Ht 67.32 in | Wt 156.4 lb

## 2013-07-15 DIAGNOSIS — G35 Multiple sclerosis: Secondary | ICD-10-CM

## 2013-07-15 NOTE — Telephone Encounter (Signed)
Pt called to let Morrie Sheldon know he went by [facility] yesterday and signed release forms for [place] to release records as requested. This was done for the faxes s

## 2013-07-15 NOTE — Telephone Encounter (Signed)
Noted  

## 2013-07-15 NOTE — Telephone Encounter (Signed)
Dr. Etheleen Mayhew called regarding mutual patient. Please call back at [phone] / Sherri S.

## 2013-07-15 NOTE — Progress Notes (Signed)
Follow-up Visit   Date: 07/15/2013    Russell Moses. MRN: [id number] DOB: 07-30-1970   Interim History: Russell Moses. is a 43 y.o. right-handed Caucasian male with history of hypertension, bipolar disorder, asthma, and anxiety returning to the clinic for follow-up of generalized weakness and paresthesias.  The patient was accompanied to the clinic by mother who also provides collateral information.  He was last seen in the clinic on 06/12/2013.  History of present illness: In 2014, he developed a tingling sensation over his head and get "swimming headed", which lasted a few seconds and spontaneously resolved. He felt that his blood pressure was going up so went to see. [name], his PCP. Starting in February 2015, he developed tremors of the hands and bilateral leg weakness. He feels that he gets fatigued easily. On March 5th 2015, he was at work and started shaking, lost his energy, felt as if his head was heavy, and was short of breath so called his mom who took him to [facility]. He has CT head and lab work was unrevealing. He was discharged once he felt better.  He also complains of seeing black spots and little spots in his eyes and left sided throbbing pain over his head, lasting about a minute.  He stopped working since March 5th 2015 and has been at home resting. He does very little activity and walks to the mailbox. His mood is pretty good since being away from work because he is relax.    - Follow-up 06/12/2013:  He had EMG performed of the right side showed mild L5 radiculopathy bilaterally.  No evidence of MG or myopathy on his lab testing.  His MRI brain wwo contrast shows white matter changes concerning for demyelinating disease.  He continues to have worsening shakiness of the hands, episodic weakness, and fatigues quickly.  He has noticed that symptoms are worse with stress and in warmer temperatures.  He denies any history of vision loss.  He has developed new  agoraphobia and is scheduled to see a psychiatrist for this.  He is not working at this time.   - Follow-up 07/15/2013:  Patient underwent CSF testing which was which shows signs of inflammation and has completed 3-days of IVMP (5/28, 5/29 and 5/30).  Since starting steroids, he is able to function and thinking is better ("less foggy").  His mother says that he is less sedentary and is able to walk longer distances without getting fatigued as easily.  He feels that his energy is better in the morning and lessens as the day goes on.  He continues to have hand tremors, which are worse under stressful situations.  His agoraphobia is also improved.     Medications:  Current Outpatient Prescriptions on File Prior to Visit  Medication Sig Dispense Refill  . amLODipine (NORVASC) 5 MG tablet Take 1 tablet (5 mg total) by mouth daily.  90 tablet  3  . B Complex-Biotin-FA (B COMPLETE PO) Take 15 mLs by mouth 2 (two) times daily.      . irbesartan (AVAPRO) 150 MG tablet Take 1 tablet (150 mg total) by mouth daily.  90 tablet  3  . omeprazole (PRILOSEC) 20 MG capsule 2 tabs by mouth per day  60 capsule  11  . FLUoxetine (PROZAC) 20 MG capsule Take 1 capsule (20 mg total) by mouth daily.  90 capsule  3   No current facility-administered medications on file prior to visit.    Allergies:  Allergies  Allergen Reactions  . Penicillins Itching and Rash    Little bumps     Review of Systems:  CONSTITUTIONAL: No fevers, chills, night sweats, or weight loss.   EYES: No visual changes or eye pain ENT: No hearing changes.  No history of nose bleeds.   RESPIRATORY: No cough, wheezing and shortness of breath.   CARDIOVASCULAR: Negative for chest pain, and palpitations.   GI: Negative for abdominal discomfort, blood in stools or black stools.  No recent change in bowel habits.   GU:  No history of incontinence.   MUSCLOSKELETAL: No history of joint pain or swelling.  No myalgias.   SKIN: Negative for lesions,  rash, and itching.   ENDOCRINE: Negative for cold or heat intolerance, polydipsia or goiter.   PSYCH:  ++ depression + anxiety symptoms.   NEURO: As Above.   Vital Signs:  BP 124/78  Pulse 67  Ht 5' 7.32" (1.71 m)  Wt 156 lb 6 oz (70.931 kg)  BMI 24.26 kg/m2  SpO2 97%   Neurological Exam: MENTAL STATUS including orientation to time, place, person, recent and remote memory, attention span and concentration, language, and fund of knowledge is normal.  Speech is not dysarthric.  CRANIAL NERVES: No visual field defects. Pupils equal round and reactive to light.  Normal conjugate, extra-ocular eye movements in all directions of gaze. No ptosis. Normal facial sensation.  Face is symmetric. Palate elevates symmetrically.  Tongue is midline.  MOTOR:  Motor strength is 5/5 in all extremities.  Mild postural tremor of the right > left hands. No pronator drift.  Tone is normal.    MSRs:   Right      Left  brachioradialis  2+   brachioradialis  2+   biceps  2+   biceps  2+   triceps  2+   triceps  2+   patellar  3+   patellar  2+   ankle jerk  2+   ankle jerk  2+   Hoffman  no   Hoffman  no   plantar response  up  plantar response  down    SENSORY:  Intact to vibration and light touch throughout.  COORDINATION/GAIT:  Normal finger-to- nose-finger and heel-to-shin.  Intact rapid alternating movements bilaterally.  Gait narrow based and stable. Stressed gait intact.  He is able to perform squat and raise up without difficulty.  Data: CT head 04/09/2013: Chronic microvascular changes.  MRI brain wwo contrast 05/28/2013:  Multiple lesions in the periventricular and juxtacortical white matter bilaterally, one of which enhances.  The pattern and distribution of these lesions suggest demyelinating disease such as multiple sclerosis.   MRI cervical spine wwo contrast 06/04/2013:  Normal MRI cervical spine   EMG right upper and lower extremity 05/14/2013: There is electrophysiological evidence of an  intraspinal canal lesion (i.e. radiculopathy) affecting the L5 nerve root/segment bilaterally. Overall, these changes are mild in degree electrically and worse on the right side.   There is no evidence of a generalized sensorimotor polyneuropathy, cervical radiculopathy or diffuse myopathy.   Labs 03/27/2013:  TSH 0.73, B12 847, ESR 14, vitamin D 37 Labs 05/14/2013:  CK 26, aldolase 26, copper 91, MG panel negative  CSF 06/22/2013: R0 W0 G61 P27  ACE 4, Lyme neg, MBP 61, IgG 1.6 OCB The patient's CSF contains >5 well defined gamma restriction bands that are also present in the patient's  corresponding serum sample, but some bands in the CSF are more prominent.  CSF cytology  with few mononuclear cells CSF cytometry was not performed due to insufficient sample.   IMPRESSION/PLAN: Mr. Elk is a 43 year-old gentleman returning to the clinic with tremors and generalized fatigue in the setting of MRI brain findings concerning with demyelinating disease.  Disease burden involves periventricular and juxtacortical white matter bilaterally, one of which enhances.  Cervical cord is normal.  At this time, my clinical suspicion is that he most likely has demyelinating disease, such as multiple sclerosis. He completed 3-days of IVMP (5/28-5/30) and reports mild-moderate improvement in energy, but continues to have tremors which I suspect may be benign essential tremors as they do not have typical rubral quality which would be suggestive of MS-related tremor.  I had a lengthy discussion regarding multiple sclerosis, pathogenesis, management options, and prognosis. Immunomodulatory therapies for multiple sclerosis were discussed and literature was provided for patient to review. In the meantime, he should continue taking vitamin D 4000U daily.    From a functional stand-point, if he continues to do as well as he is, he may be able to return to work with lighter duties and schedule (preferable morning shift) within the  next month.  If, however, there is ongoing mood-related issues such as anxiety/depression that prevent him from working, I will defer to his psychiatrist when he can return to work.    I will see him back in 6 weeks to discuss which immunomodulatory therapy can be started.      The duration of this appointment visit was 35 minutes of face-to-face time with the patient.  Greater than 50% of this time was spent in counseling, explanation of diagnosis, planning of further management, and coordination of care.   Thank you for allowing me to participate in patient's care.  If I can answer any additional questions, I would be pleased to do so.    Sincerely,    Danee Soller K. Posey Pronto, DO

## 2013-07-15 NOTE — Telephone Encounter (Signed)
See previous entry - this was done for the faxes sent to our office yesterday. CB# [phone] / Sherri S.

## 2013-07-15 NOTE — Telephone Encounter (Signed)
Returned call.  Peer-to-peer for disability requesting my opinion on whether he is able to return to work. I explained that he is still recovering from his recent bout of MS and should be able to return to work after June 30.  I recommended that he should be able to perform light duties as able. All questions were answered.  [name] K. Allena Katz, DO

## 2013-07-15 NOTE — Patient Instructions (Addendum)
1.  Continue vitamin D 4000 units daily 2.  Information provided for immunomodulatory therapy for multiple sclerosis 3.  Follow-up with psychiatry for anxiety  4.  Return to clinic in 6-weeks

## 2013-07-17 ENCOUNTER — Encounter: Payer: Self-pay | Admitting: Neurology

## 2013-07-17 ENCOUNTER — Telehealth: Payer: Self-pay | Admitting: Internal Medicine

## 2013-07-17 NOTE — Telephone Encounter (Signed)
Dr. Delaine LameStephen Broomes office is calling on behalf of [employer] to conduct a review [facility] the [facility] [employer] [facility].  They are asking to speak with Dr. Jonny RuizJohn for additional clinical information on the patient's disability claim.  They can be reached at Ph#: [phone].

## 2013-07-17 NOTE — Telephone Encounter (Signed)
Already spoken to 2 reviewers yesterday, including Dr Elodia FlorenceBroomes and a neurologist, I dont have any further to add

## 2013-07-18 LAB — FUNGUS CULTURE W SMEAR: Fungal Smear: NONE SEEN

## 2013-07-20 ENCOUNTER — Encounter: Payer: BC Managed Care – PPO | Admitting: Neurology

## 2013-07-28 ENCOUNTER — Ambulatory Visit (HOSPITAL_COMMUNITY): Payer: BC Managed Care – PPO | Admitting: Psychiatry

## 2013-07-30 ENCOUNTER — Encounter: Payer: Self-pay | Admitting: Internal Medicine

## 2013-07-30 ENCOUNTER — Ambulatory Visit (INDEPENDENT_AMBULATORY_CARE_PROVIDER_SITE_OTHER): Payer: BC Managed Care – PPO | Admitting: Internal Medicine

## 2013-07-30 VITALS — BP 120/80 | HR 65 | Temp 98.2°F | Wt 161.0 lb

## 2013-07-30 DIAGNOSIS — I10 Essential (primary) hypertension: Secondary | ICD-10-CM

## 2013-07-30 DIAGNOSIS — K219 Gastro-esophageal reflux disease without esophagitis: Secondary | ICD-10-CM | POA: Insufficient documentation

## 2013-07-30 DIAGNOSIS — F411 Generalized anxiety disorder: Secondary | ICD-10-CM

## 2013-07-30 HISTORY — DX: Gastro-esophageal reflux disease without esophagitis: K21.9

## 2013-07-30 NOTE — Progress Notes (Signed)
Pre visit review using our clinic review tool, if applicable. No additional management support is needed unless otherwise documented below in the visit note. 

## 2013-07-30 NOTE — Assessment & Plan Note (Signed)
stable overall by history and exam, recent data reviewed with pt, and pt to continue medical treatment as before,  to f/u any worsening symptoms or concerns Lab Results  Component Value Date   WBC 5.9 03/27/2013   HGB 14.9 03/27/2013   HCT 44.7 03/27/2013   PLT 231.0 03/27/2013   GLUCOSE 111* 03/27/2013   CHOL 145 02/23/2013   TRIG 68.0 02/23/2013   HDL 46.00 02/23/2013   LDLCALC 85 02/23/2013   ALT 35 03/27/2013   AST 20 03/27/2013   NA 137 03/27/2013   K 3.8 03/27/2013   CL 103 03/27/2013   CREATININE 0.79 05/26/2013   BUN 11 03/27/2013   CO2 29 03/27/2013   TSH 0.73 03/27/2013   PSA 0.64 02/23/2013

## 2013-07-30 NOTE — Progress Notes (Signed)
Subjective:    Patient ID: Russell Moses., male    DOB: [birth year], 43 y.o.   MRN: [id number]  HPI  Here to fu; S/p steroid tx IV x 3 days, late may, now feels better, "more like I used to feel."  Drove back and forth to church recently, seemed to go well, feels better about his ability to function.  Has been more outside for more Vit D, but avoids the summer heat when possible.  Has gone out to eat to restraunts several times lately, feels better around people now.  Even went to [employer] in [place] without significant increased anxiety.  Overall good compliance with treatment, and good medicine tolerability.  Pt denies chest pain, increased sob or doe, wheezing, orthopnea, PND, increased LE swelling, palpitations, dizziness or syncope.  Has not taken the prozac lately as he felt more jittery and less energy.  Has not actually seen psychiatry to date, and no appts .  Has applied for ST work disability recently related to being out of work for several months, but not yet tried to apply for soc sec disability.  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    Also, Denies worsening reflux, abd pain, dysphagia, n/v, bowel change or blood.  Past Medical History  Diagnosis Date  . BIPOLAR DISORDER UNSPECIFIED 04/18/2009    Qualifier: Diagnosis of  By: Jonny Ruiz MD, Len Blalock   . ASTHMA, CHILDHOOD 04/18/2009    Qualifier: Diagnosis of  By: Jonny Ruiz MD, Len Blalock   . Hypertension   . Insomnia   . Multiple sclerosis 2015   Past Surgical History  Procedure Laterality Date  . Hernia repair      reports that he has never smoked. He does not have any smokeless tobacco history on file. He reports that he does not drink alcohol or use illicit drugs. family history includes COPD in his sister; Diabetes in his father; Fibromyalgia in his  sister; Healthy in his sister; Heart disease in his father; Hypertension in his father and mother; Stroke in his father. Allergies  Allergen Reactions  . Penicillins Itching and Rash    Little bumps   Current Outpatient Prescriptions on File Prior to Visit  Medication Sig Dispense Refill  . amLODipine (NORVASC) 5 MG tablet Take 1 tablet (5 mg total) by mouth daily.  90 tablet  3  . B Complex-Biotin-FA (B COMPLETE PO) Take 15 mLs by mouth 2 (two) times daily.      . irbesartan (AVAPRO) 150 MG tablet Take 1 tablet (150 mg total) by mouth daily.  90 tablet  3  . omeprazole (PRILOSEC) 20 MG capsule 2 tabs by mouth per day  60 capsule  11   No current facility-administered medications on file prior to visit.     Review of Systems  Constitutional: Negative for unusual diaphoresis or other sweats  HENT: Negative for ringing in ear Eyes: Negative for double vision or worsening visual disturbance.  Respiratory: Negative for choking and stridor.   Gastrointestinal: Negative for vomiting or other signifcant bowel change Genitourinary: Negative for hematuria or decreased urine volume.  Musculoskeletal: Negative for other MSK pain or swelling Skin: Negative for color change and worsening wound.  Neurological: Negative for tremors and numbness other than noted  Psychiatric/Behavioral: Negative for decreased concentration or agitation other than above       Objective:   Physical Exam BP 120/80  Pulse 65  Temp(Src) 98.2 F (36.8 C) (Oral)  Wt 161 lb (73.029 kg)  SpO2 97% VS noted,  Constitutional: Pt appears well-developed, well-nourished.  HENT: Head: NCAT.  Right Ear: External ear normal.  Left Ear: External ear normal.  Eyes: . Pupils are equal, round, and reactive to light. Conjunctivae and EOM are normal Neck: Normal range of motion. Neck supple.  Cardiovascular: Normal rate and regular rhythm.   Pulmonary/Chest: Effort normal and breath sounds normal.  Abd:  Soft, NT, ND, + BS -  benign exam Neurological: Pt is alert. Not confused , motor grossly intact, o/w not done in detail Skin: Skin is warm. No rash Psychiatric: Pt behavior is normal. No agitation. mild nervous only    Assessment & Plan:

## 2013-07-30 NOTE — Assessment & Plan Note (Signed)
stable overall by history and exam, recent data reviewed with pt, and pt to continue medical treatment as before,  to f/u any worsening symptoms or concerns BP Readings from Last 3 Encounters:  07/30/13 120/80  07/15/13 124/78  07/04/13 131/73

## 2013-07-30 NOTE — Patient Instructions (Signed)
Ok to stay off the prozac for now  Please continue all other medications as before, and refills have been done if requested.  Please have the pharmacy call with any other refills you may need.  Please continue your efforts at being more active, low cholesterol diet, and weight control.  Please keep your appointments with your specialists as you may have planned  Please return in 6 months, or sooner if needed

## 2013-07-30 NOTE — Assessment & Plan Note (Signed)
Ok to stay off the prozac fornow, agorphaobia improved, appears discussion of tx and steroid for MS has helped, agorphaobia improved, to re-start light duty at work July 1 as planned

## 2013-07-31 ENCOUNTER — Encounter: Payer: Self-pay | Admitting: *Deleted

## 2013-07-31 ENCOUNTER — Telehealth: Payer: Self-pay | Admitting: Neurology

## 2013-07-31 NOTE — Telephone Encounter (Signed)
Patient notified letter is being faxed.

## 2013-07-31 NOTE — Telephone Encounter (Signed)
Pt called f/u on the letter for him to go back to work. He would like that letter faxed to him. Please call pt [phone]

## 2013-07-31 NOTE — Telephone Encounter (Signed)
Please call he has called again [phone]

## 2013-08-01 ENCOUNTER — Encounter: Payer: Self-pay | Admitting: Neurology

## 2013-08-03 ENCOUNTER — Telehealth: Payer: Self-pay | Admitting: Neurology

## 2013-08-03 ENCOUNTER — Encounter: Payer: Self-pay | Admitting: Neurology

## 2013-08-03 NOTE — Telephone Encounter (Signed)
I sent him a note through My Chart.

## 2013-08-03 NOTE — Telephone Encounter (Signed)
Pt has called a 2nd time regarding this. CB# [phone] / Sherri S.

## 2013-08-03 NOTE — Telephone Encounter (Signed)
Pt called wanting to know if another note could be written. He would like for the note to state that he can lift more then 20lbs/ He also would like to know what the ideal weight for him to lift is. [phone]

## 2013-08-04 DIAGNOSIS — Z0279 Encounter for issue of other medical certificate: Secondary | ICD-10-CM

## 2013-08-06 ENCOUNTER — Telehealth: Payer: Self-pay | Admitting: Family Medicine

## 2013-08-06 NOTE — Telephone Encounter (Signed)
Called patient, left msg on his voicemail that disability forms were ready.

## 2013-08-26 ENCOUNTER — Encounter: Payer: Self-pay | Admitting: Neurology

## 2013-08-26 ENCOUNTER — Ambulatory Visit (INDEPENDENT_AMBULATORY_CARE_PROVIDER_SITE_OTHER): Payer: BC Managed Care – PPO | Admitting: Neurology

## 2013-08-26 VITALS — BP 118/70 | HR 61 | Ht 67.32 in | Wt 168.4 lb

## 2013-08-26 DIAGNOSIS — Z79899 Other long term (current) drug therapy: Secondary | ICD-10-CM

## 2013-08-26 DIAGNOSIS — G35 Multiple sclerosis: Secondary | ICD-10-CM

## 2013-08-26 LAB — COMPREHENSIVE METABOLIC PANEL
ALT: 78 U/L — AB (ref 0–53)
AST: 40 U/L — ABNORMAL HIGH (ref 0–37)
Albumin: 4.2 g/dL (ref 3.5–5.2)
Alkaline Phosphatase: 97 U/L (ref 39–117)
BILIRUBIN TOTAL: 0.3 mg/dL (ref 0.2–1.2)
BUN: 9 mg/dL (ref 6–23)
CO2: 27 mEq/L (ref 19–32)
Calcium: 9.7 mg/dL (ref 8.4–10.5)
Chloride: 107 mEq/L (ref 96–112)
Creat: 0.85 mg/dL (ref 0.50–1.35)
GLUCOSE: 104 mg/dL — AB (ref 70–99)
Potassium: 4.3 mEq/L (ref 3.5–5.3)
Sodium: 141 mEq/L (ref 135–145)
Total Protein: 6.3 g/dL (ref 6.0–8.3)

## 2013-08-26 LAB — CBC
HCT: 43.2 % (ref 39.0–52.0)
Hemoglobin: 15 g/dL (ref 13.0–17.0)
MCH: 30.4 pg (ref 26.0–34.0)
MCHC: 34.7 g/dL (ref 30.0–36.0)
MCV: 87.6 fL (ref 78.0–100.0)
PLATELETS: 216 10*3/uL (ref 150–400)
RBC: 4.93 MIL/uL (ref 4.22–5.81)
RDW: 13.5 % (ref 11.5–15.5)
WBC: 5.8 10*3/uL (ref 4.0–10.5)

## 2013-08-26 NOTE — Progress Notes (Signed)
Follow-up Visit   Date: 08/26/2013    Russell Moses. MRN: [id number] DOB: 25-Nov-1970   Interim History: Russell Davari Lopes. is a 43 y.o. right-handed Caucasian male with history of hypertension, bipolar disorder, asthma, and anxiety returning to the clinic for follow-up of generalized weakness and paresthesias.  The patient was accompanied to the clinic by mother who also provides collateral information.  He was last seen in the clinic on 06/12/2013.  History of present illness: In 2014, he developed a tingling sensation over his head and get "swimming headed", which lasted a few seconds and spontaneously resolved. He felt that his blood pressure was going up so went to see. [name], his PCP. Starting in February 2015, he developed tremors of the hands and bilateral leg weakness. He feels that he gets fatigued easily. On March 5th 2015, he was at work and started shaking, lost his energy, felt as if his head was heavy, and was short of breath so called his mom who took him to [facility]. He has CT head and lab work was unrevealing. He was discharged once he felt better.  He also complains of seeing black spots and little spots in his eyes and left sided throbbing pain over his head, lasting about a minute.  He stopped working since March 5th 2015 and has been at home resting. He does very little activity and walks to the mailbox. His mood is pretty good since being away from work because he is relax.   - Follow-up 06/12/2013:  He had EMG performed of the right side showed mild L5 radiculopathy bilaterally.  No evidence of MG or myopathy on his lab testing.  His MRI brain wwo contrast shows white matter changes concerning for demyelinating disease.  He continues to have worsening shakiness of the hands, episodic weakness, and fatigues quickly.  He has noticed that symptoms are worse with stress and in warmer temperatures.  He denies any history of vision loss.  He has developed new  agoraphobia and is scheduled to see a psychiatrist for this.  He is not working at this time.   - Follow-up 07/15/2013:  Patient underwent CSF testing which was which shows signs of inflammation and has completed 3-days of IVMP (5/28, 5/29 and 5/30).  Since starting steroids, he is able to function and thinking is better ("less foggy").  His mother says that he is less sedentary and is able to walk longer distances without getting fatigued as easily.  He feels that his energy is better in the morning and lessens as the day goes on.  He continues to have hand tremors, which are worse under stressful situations.  His agoraphobia is also improved.    UPDATE 08/26/2013:  He has not returned to work because they do not have work based on his restrictions.  He was helping his father in the yard and became very tired afterwards.  He complains of intermittent tightness of his arm and legs, especially with over exertion such as climbing stairs.  He is driving more and walking about 3-5 laps daily.  He is taking sleep supplement which seems to be helping him stay well-rested.  He has occasional blurry vision.   Medications:  Current Outpatient Prescriptions on File Prior to Visit  Medication Sig Dispense Refill  . amLODipine (NORVASC) 5 MG tablet Take 1 tablet (5 mg total) by mouth daily.  90 tablet  3  . B Complex-Biotin-FA (B COMPLETE PO) Take 15 mLs by mouth 2 (  two) times daily.      . irbesartan (AVAPRO) 150 MG tablet Take 1 tablet (150 mg total) by mouth daily.  90 tablet  3  . omeprazole (PRILOSEC) 20 MG capsule 2 tabs by mouth per day  60 capsule  11   No current facility-administered medications on file prior to visit.    Allergies:  Allergies  Allergen Reactions  . Penicillins Itching and Rash    Little bumps     Review of Systems:  CONSTITUTIONAL: No fevers, chills, night sweats, or weight loss.   EYES: No visual changes or eye pain ENT: No hearing changes.  No history of nose bleeds.     RESPIRATORY: No cough, wheezing and shortness of breath.   CARDIOVASCULAR: Negative for chest pain, and palpitations.   GI: Negative for abdominal discomfort, blood in stools or black stools.  No recent change in bowel habits.   GU:  No history of incontinence.   MUSCLOSKELETAL: No history of joint pain or swelling.  No myalgias.   SKIN: Negative for lesions, rash, and itching.   ENDOCRINE: Negative for cold or heat intolerance, polydipsia or goiter.   PSYCH: No depression or anxiety symptoms.   NEURO: As Above.   Vital Signs:  BP 118/70  Pulse 61  Ht 5' 7.32" (1.71 m)  Wt 168 lb 7 oz (76.403 kg)  BMI 26.13 kg/m2  SpO2 96%   Neurological Exam: MENTAL STATUS including orientation to time, place, person, recent and remote memory, attention span and concentration, language, and fund of knowledge is normal.  Speech is not dysarthric.  CRANIAL NERVES:  Pupils equal round and reactive to light.  Normal conjugate, extra-ocular eye movements in all directions of gaze. No ptosis. Normal facial sensation.  Face is symmetric. Palate elevates symmetrically.  Tongue is midline.  MOTOR:  Motor strength is 5/5 in all extremities.  Mild postural tremor of the right > left hands (improved). No pronator drift.  Tone is normal.    MSRs:   Right      Left  brachioradialis  2+   brachioradialis  2+   biceps  2+   biceps  2+   triceps  2+   triceps  2+   patellar  3+   patellar  2+   ankle jerk  2+   ankle jerk  2+   Hoffman  no   Hoffman  no   plantar response  up  plantar response  down    SENSORY:  Intact to vibration and light touch throughout.  COORDINATION/GAIT:  Gait narrow based and stable. Stressed gait intact.    Data: CT head 04/09/2013: Chronic microvascular changes.  MRI brain wwo contrast 05/28/2013:  Multiple lesions in the periventricular and juxtacortical white matter bilaterally, one of which enhances.  The pattern and distribution of these lesions suggest demyelinating disease  such as multiple sclerosis.   MRI cervical spine wwo contrast 06/04/2013:  Normal MRI cervical spine   EMG right upper and lower extremity 05/14/2013: There is electrophysiological evidence of an intraspinal canal lesion (i.e. radiculopathy) affecting the L5 nerve root/segment bilaterally. Overall, these changes are mild in degree electrically and worse on the right side.   There is no evidence of a generalized sensorimotor polyneuropathy, cervical radiculopathy or diffuse myopathy.   Labs 03/27/2013:  TSH 0.73, B12 847, ESR 14, vitamin D 37 Labs 05/14/2013:  CK 26, aldolase 26, copper 91, MG panel negative  CSF 06/22/2013: R0 W0 G61 P27  ACE 4, Lyme neg,  MBP 61, IgG 1.6 OCB The patient's CSF contains >5 well defined gamma restriction bands that are also present in the patient's  corresponding serum sample, but some bands in the CSF are more prominent.  CSF cytology with few mononuclear cells CSF cytometry was not performed due to insufficient sample.   IMPRESSION/PLAN: Mr. Gernert is a 43 year-old gentleman returning to the clinic with multiple sclerosis (diagnosed May 2015).  His manifesting symptoms included generalized fatigue and tremors. Imaging of his brain shows periventricular and juxtacortical white matter bilaterally, one of which enhances.  Cervical cord is normal.  CSF testing showed inflammatory changes with oligoclonal bands present. Due to clinical suspicion of multiple sclerosis, he completed 3-days of IVMP (5/28-5/30) with significant improvement. At his last visit, the diagnosis of multiple sclerosis was discussed and she was provided on immunomodulatory therapy.  Today, he is doing well and exam remains stable. He is still undecided regarding immunomodulatory therapy but is considering Avonex or Copaxone.  I discussed the risks and benefits of the medications he is most interested in and provided him with more specific literature on each. He will contact the office next week with this  decision. He should continue taking vitamin D 4000U daily.  I encouraged him to stay in cool environments, as warmer temperatures may exacerbate his symptoms.  In the meantime, I would like to obtain a functional assessment to determine his work limitations, as he is still not working due to job restrictions.  I will see him back in 28-month.      The duration of this appointment visit was 30 minutes of face-to-face time with the patient.  Greater than 50% of this time was spent in counseling, explanation of diagnosis, planning of further management, and coordination of care.   Thank you for allowing me to participate in patient's care.  If I can answer any additional questions, I would be pleased to do so.    Sincerely,    Russell K. PPosey Pronto DO

## 2013-08-26 NOTE — Patient Instructions (Signed)
1.  Functional assessment 2.  Please contact me with medication you would like to start 3.  Check blood work today 4.  Return to clinic in 37-months

## 2013-08-27 ENCOUNTER — Ambulatory Visit: Payer: BC Managed Care – PPO | Attending: Neurology | Admitting: Occupational Therapy

## 2013-08-27 DIAGNOSIS — IMO0001 Reserved for inherently not codable concepts without codable children: Secondary | ICD-10-CM | POA: Diagnosis present

## 2013-08-27 DIAGNOSIS — M6281 Muscle weakness (generalized): Secondary | ICD-10-CM | POA: Diagnosis not present

## 2013-08-27 DIAGNOSIS — R5381 Other malaise: Secondary | ICD-10-CM | POA: Insufficient documentation

## 2013-08-27 DIAGNOSIS — G35 Multiple sclerosis: Secondary | ICD-10-CM | POA: Diagnosis not present

## 2013-08-31 ENCOUNTER — Ambulatory Visit: Payer: BC Managed Care – PPO | Admitting: Occupational Therapy

## 2013-08-31 ENCOUNTER — Other Ambulatory Visit: Payer: Self-pay | Admitting: *Deleted

## 2013-08-31 DIAGNOSIS — IMO0001 Reserved for inherently not codable concepts without codable children: Secondary | ICD-10-CM | POA: Diagnosis not present

## 2013-08-31 DIAGNOSIS — Z79899 Other long term (current) drug therapy: Secondary | ICD-10-CM

## 2013-09-01 ENCOUNTER — Encounter: Payer: Self-pay | Admitting: Neurology

## 2013-09-03 ENCOUNTER — Ambulatory Visit: Payer: BC Managed Care – PPO | Admitting: Occupational Therapy

## 2013-09-03 DIAGNOSIS — IMO0001 Reserved for inherently not codable concepts without codable children: Secondary | ICD-10-CM | POA: Diagnosis not present

## 2013-09-08 ENCOUNTER — Encounter: Payer: BC Managed Care – PPO | Admitting: Occupational Therapy

## 2013-09-08 ENCOUNTER — Telehealth: Payer: Self-pay | Admitting: Neurology

## 2013-09-08 NOTE — Telephone Encounter (Signed)
Will you check the drawer?  I think I put the paper in there.  Thanks.

## 2013-09-08 NOTE — Telephone Encounter (Signed)
Pt needs to have blood work redone and needs the papers at the front desk tomorrow and he will come by to pick them up pt phone [phone]

## 2013-09-08 NOTE — Telephone Encounter (Signed)
It is up front thanks

## 2013-09-09 ENCOUNTER — Ambulatory Visit: Payer: BC Managed Care – PPO | Attending: Neurology | Admitting: Occupational Therapy

## 2013-09-09 ENCOUNTER — Other Ambulatory Visit: Payer: Self-pay | Admitting: Neurology

## 2013-09-09 DIAGNOSIS — M6281 Muscle weakness (generalized): Secondary | ICD-10-CM | POA: Diagnosis not present

## 2013-09-09 DIAGNOSIS — IMO0001 Reserved for inherently not codable concepts without codable children: Secondary | ICD-10-CM | POA: Insufficient documentation

## 2013-09-09 DIAGNOSIS — G35 Multiple sclerosis: Secondary | ICD-10-CM | POA: Diagnosis not present

## 2013-09-09 DIAGNOSIS — R5381 Other malaise: Secondary | ICD-10-CM | POA: Diagnosis not present

## 2013-09-09 LAB — COMPREHENSIVE METABOLIC PANEL
ALBUMIN: 4.1 g/dL (ref 3.5–5.2)
ALT: 63 U/L — ABNORMAL HIGH (ref 0–53)
AST: 31 U/L (ref 0–37)
Alkaline Phosphatase: 90 U/L (ref 39–117)
BUN: 9 mg/dL (ref 6–23)
CALCIUM: 9.5 mg/dL (ref 8.4–10.5)
CHLORIDE: 106 meq/L (ref 96–112)
CO2: 27 mEq/L (ref 19–32)
Creat: 0.83 mg/dL (ref 0.50–1.35)
GLUCOSE: 89 mg/dL (ref 70–99)
POTASSIUM: 4.2 meq/L (ref 3.5–5.3)
SODIUM: 140 meq/L (ref 135–145)
TOTAL PROTEIN: 6.3 g/dL (ref 6.0–8.3)
Total Bilirubin: 0.4 mg/dL (ref 0.2–1.2)

## 2013-09-10 ENCOUNTER — Encounter: Payer: Self-pay | Admitting: Neurology

## 2013-09-11 ENCOUNTER — Encounter: Payer: Self-pay | Admitting: Neurology

## 2013-09-11 ENCOUNTER — Ambulatory Visit: Payer: BC Managed Care – PPO | Admitting: Occupational Therapy

## 2013-09-14 ENCOUNTER — Ambulatory Visit: Payer: BC Managed Care – PPO | Admitting: Occupational Therapy

## 2013-09-14 ENCOUNTER — Other Ambulatory Visit: Payer: Self-pay | Admitting: *Deleted

## 2013-09-14 DIAGNOSIS — IMO0001 Reserved for inherently not codable concepts without codable children: Secondary | ICD-10-CM | POA: Diagnosis not present

## 2013-09-14 DIAGNOSIS — M5416 Radiculopathy, lumbar region: Secondary | ICD-10-CM

## 2013-09-17 ENCOUNTER — Ambulatory Visit: Payer: BC Managed Care – PPO | Admitting: Physical Therapy

## 2013-09-17 ENCOUNTER — Ambulatory Visit: Payer: BC Managed Care – PPO | Admitting: Occupational Therapy

## 2013-09-17 DIAGNOSIS — IMO0001 Reserved for inherently not codable concepts without codable children: Secondary | ICD-10-CM | POA: Diagnosis not present

## 2013-09-18 ENCOUNTER — Ambulatory Visit: Payer: BC Managed Care – PPO | Admitting: Occupational Therapy

## 2013-09-22 ENCOUNTER — Ambulatory Visit: Payer: BC Managed Care – PPO | Admitting: Occupational Therapy

## 2013-09-22 DIAGNOSIS — IMO0001 Reserved for inherently not codable concepts without codable children: Secondary | ICD-10-CM | POA: Diagnosis not present

## 2013-09-24 ENCOUNTER — Ambulatory Visit: Payer: BC Managed Care – PPO | Admitting: Occupational Therapy

## 2013-09-24 DIAGNOSIS — IMO0001 Reserved for inherently not codable concepts without codable children: Secondary | ICD-10-CM | POA: Diagnosis not present

## 2013-09-25 ENCOUNTER — Ambulatory Visit: Payer: BC Managed Care – PPO | Admitting: Physical Therapy

## 2013-09-25 DIAGNOSIS — IMO0001 Reserved for inherently not codable concepts without codable children: Secondary | ICD-10-CM | POA: Diagnosis not present

## 2013-09-29 ENCOUNTER — Ambulatory Visit: Payer: BC Managed Care – PPO | Admitting: Physical Therapy

## 2013-09-29 DIAGNOSIS — IMO0001 Reserved for inherently not codable concepts without codable children: Secondary | ICD-10-CM | POA: Diagnosis not present

## 2013-10-01 ENCOUNTER — Ambulatory Visit: Payer: BC Managed Care – PPO | Admitting: Physical Therapy

## 2013-10-01 DIAGNOSIS — IMO0001 Reserved for inherently not codable concepts without codable children: Secondary | ICD-10-CM | POA: Diagnosis not present

## 2013-10-06 ENCOUNTER — Encounter: Payer: Self-pay | Admitting: Neurology

## 2013-10-06 ENCOUNTER — Ambulatory Visit: Payer: BC Managed Care – PPO | Admitting: Physical Therapy

## 2013-10-07 ENCOUNTER — Encounter: Payer: Self-pay | Admitting: Neurology

## 2013-10-08 ENCOUNTER — Ambulatory Visit: Payer: BC Managed Care – PPO | Admitting: Physical Therapy

## 2013-10-13 ENCOUNTER — Ambulatory Visit: Payer: BC Managed Care – PPO | Admitting: Physical Therapy

## 2013-10-15 ENCOUNTER — Ambulatory Visit: Payer: BC Managed Care – PPO | Admitting: Physical Therapy

## 2013-10-20 ENCOUNTER — Ambulatory Visit: Payer: BC Managed Care – PPO | Admitting: Physical Therapy

## 2013-10-21 ENCOUNTER — Ambulatory Visit: Payer: BC Managed Care – PPO | Attending: Neurology

## 2013-10-21 DIAGNOSIS — G35 Multiple sclerosis: Secondary | ICD-10-CM | POA: Insufficient documentation

## 2013-10-21 DIAGNOSIS — R5381 Other malaise: Secondary | ICD-10-CM | POA: Diagnosis not present

## 2013-10-21 DIAGNOSIS — M6281 Muscle weakness (generalized): Secondary | ICD-10-CM | POA: Insufficient documentation

## 2013-10-21 DIAGNOSIS — IMO0001 Reserved for inherently not codable concepts without codable children: Secondary | ICD-10-CM | POA: Insufficient documentation

## 2013-10-22 ENCOUNTER — Ambulatory Visit: Payer: BC Managed Care – PPO | Admitting: Physical Therapy

## 2013-10-27 ENCOUNTER — Ambulatory Visit (INDEPENDENT_AMBULATORY_CARE_PROVIDER_SITE_OTHER): Payer: BC Managed Care – PPO | Admitting: Neurology

## 2013-10-27 ENCOUNTER — Encounter: Payer: Self-pay | Admitting: Neurology

## 2013-10-27 VITALS — BP 130/70 | HR 60 | Ht 67.0 in | Wt 174.5 lb

## 2013-10-27 DIAGNOSIS — G35 Multiple sclerosis: Secondary | ICD-10-CM | POA: Insufficient documentation

## 2013-10-27 DIAGNOSIS — E663 Overweight: Secondary | ICD-10-CM

## 2013-10-27 NOTE — Patient Instructions (Addendum)
1.  Continue copaxone  2.  Continue vitamin D 4000 units 3.  Start to ease into an exercise program to stay active and loose weight 4.  Letter provided to return to work on 9/28 with light duties 5.  Return to clinic in 48-months

## 2013-10-27 NOTE — Progress Notes (Signed)
Follow-up Visit   Date: 10/27/2013    Russell Moses. MRN: [id number] DOB: 1970-03-26   Interim History: Hubbert Koltin Wehmeyer. is a 43 y.o. right-handed Caucasian male with history of hypertension, bipolar disorder, asthma, and anxiety returning to the clinic for follow-up of multiple sclerosis.  The patient was accompanied to the clinic by mother who also provides collateral information.    History of present illness: In 2014, he developed a tingling sensation over his head and get "swimming headed", which lasted a few seconds and spontaneously resolved. He felt that his blood pressure was going up so went to see. [name], his PCP. Starting in February 2015, he developed tremors of the hands and bilateral leg weakness. He feels that he gets fatigued easily. On March 5th 2015, he was at work and started shaking, lost his energy, felt as if his head was heavy, and was short of breath so called his mom who took him to [facility]. He has CT head and lab work was unrevealing. He was discharged once he felt better.  He also complains of seeing black spots and little spots in his eyes and left sided throbbing pain over his head, lasting about a minute. He stopped working since March 5th 2015 and has been at home resting. He does very little activity and walks to the mailbox.   - Follow-up 06/12/2013:  He had EMG performed of the right side showed mild L5 radiculopathy bilaterally.  No evidence of MG or myopathy on his lab testing.  His MRI brain wwo contrast shows white matter changes concerning for demyelinating disease.  He continues to have worsening shakiness of the hands, episodic weakness, and fatigues quickly.  He has developed new agoraphobia and is scheduled to see a psychiatrist for this.  He is not working at this time.   - Follow-up 07/15/2013:  Patient underwent CSF testing which was which shows signs of inflammation and has completed 3-days of IVMP (5/28, 5/29 and 5/30).  Since  starting steroids, he is able to function and thinking is better ("less foggy").  His mother says that he is less sedentary and is able to walk longer distances without getting fatigued as easily.   - Follow-up 08/26/2013:  He has not returned to work because they do not have work based on his restrictions.   He complains of intermittent tightness of his arm and legs, especially with over exertion such as climbing stairs.  He is driving more and walking about 3-5 laps daily.     UPDATE 10/27/2013:  He started copaxone on 9/2 and is tolerating it well.  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  He completed functional capacity assessment which indicated he can perform light work.  No new neurological complaints.  Mood is good.  He has gained almost 20lb this summer.  His mom says that he sang in church on Sunday, which is new since the last time he did that was 32-month ago!   Medications:  Current Outpatient Prescriptions on File Prior to Visit  Medication Sig Dispense Refill  . amLODipine (NORVASC) 5 MG tablet Take 1 tablet (5 mg total) by mouth daily.  90 tablet  3  . B Complex-Biotin-FA (B COMPLETE PO) Take 15 mLs by mouth 2 (two) times daily.      . irbesartan (AVAPRO) 150 MG tablet Take 1 tablet (150 mg total) by mouth daily.  90 tablet  3  . omeprazole (PRILOSEC) 20 MG capsule 2 tabs by mouth per day  60 capsule  11   No current facility-administered medications on file prior to visit.    Allergies:  Allergies  Allergen Reactions  . Penicillins Itching and Rash    Little bumps     Review of Systems:  CONSTITUTIONAL: No fevers, chills, night sweats, or weight loss.   EYES: No visual changes or eye pain ENT: No hearing changes.  No history of nose bleeds.   RESPIRATORY: No cough, wheezing and shortness of breath.   CARDIOVASCULAR: Negative for chest pain, and palpitations.   GI: Negative for abdominal discomfort, blood in  stools or black stools.  No recent change in bowel habits.   GU:  No history of incontinence.   MUSCLOSKELETAL: No history of joint pain or swelling.  No myalgias.   SKIN: Negative for lesions, rash, and itching.   ENDOCRINE: Negative for cold or heat intolerance, polydipsia or goiter.   PSYCH: No depression or anxiety symptoms.   NEURO: As Above.   Vital Signs:  BP 130/70  Pulse 60  Ht '5\' 7"'  (1.702 m)  Wt 174 lb 8 oz (79.153 kg)  BMI 27.32 kg/m2  SpO2 98%   Neurological Exam: MENTAL STATUS including orientation to time, place, person, recent and remote memory, attention span and concentration, language, and fund of knowledge is normal.  Speech is not dysarthric.  CRANIAL NERVES:  Pupils equal round and reactive to light.  Normal conjugate, extra-ocular eye movements in all directions of gaze. No ptosis. Normal facial sensation.  Face is symmetric. Palate elevates symmetrically.  Tongue is midline.  MOTOR:  Motor strength is 5/5 in all extremities.  No tremor today. No pronator drift.  Tone is normal.    MSRs:   Right      Left  brachioradialis  2+   brachioradialis  2+   biceps  2+   biceps  2+   triceps  2+   triceps  2+   patellar  2+   patellar  2+   ankle jerk  2+   ankle jerk  2+   Hoffman  no   Hoffman  no   plantar response  up  plantar response  down    SENSORY:  Intact to vibration and light touch throughout.  COORDINATION/GAIT:  Gait narrow based and stable. Stressed gait intact.    Data: CT head 04/09/2013: Chronic microvascular changes.  MRI brain wwo contrast 05/28/2013:  Multiple lesions in the periventricular and juxtacortical white matter bilaterally, one of which enhances.  The pattern and distribution of these lesions suggest demyelinating disease such as multiple sclerosis.   MRI cervical spine wwo contrast 06/04/2013:  Normal MRI cervical spine   EMG right upper and lower extremity 05/14/2013: There is electrophysiological evidence of an intraspinal canal  lesion (i.e. radiculopathy) affecting the L5 nerve root/segment bilaterally. Overall, these changes are mild in degree electrically and worse on the right side.   There is no evidence of a generalized sensorimotor polyneuropathy, cervical radiculopathy or diffuse myopathy.   Labs 03/27/2013:  TSH 0.73, B12 847, ESR 14, vitamin D 37 Labs 05/14/2013:  CK 26, aldolase 26, copper 91, MG panel negative  CSF 06/22/2013: R0 W0 G61 P27  ACE 4, Lyme neg, MBP 61, IgG 1.6 OCB The patient's CSF contains >5 well defined gamma restriction bands that are also present in the patient's  corresponding serum sample, but some bands in the CSF are more prominent.  CSF cytology with few mononuclear cells CSF cytometry was not performed due to insufficient sample.   IMPRESSION/PLAN: Mr. Delaguila is a 43 year-old gentleman returning to the clinic with multiple sclerosis (diagnosed May 2015).  His manifesting symptoms included generalized fatigue and tremors. Imaging of his brain shows periventricular and juxtacortical white matter bilaterally, one of which enhances.  Cervical cord is normal.  CSF testing showed inflammatory changes with oligoclonal bands present. Due to clinical suspicion of multiple sclerosis, he completed 3-days of IVMP (5/28-5/30) with significant improvement.  He started Copaxone on 10/07/2013 and did not have any injection side effects.  Today, he is doing well and exam remains stable and is tolerating Copaxone well, which will be continued.  He should continue taking vitamin D 4000U daily.   We discussed at length his functional capacity assessment which indicates he can perform light work and is requested to have a letter specifying job restrictions.   OK to return to work with lighter duties on 9/28 - letter provided.  Of note, he has gained 20lb which is most likely to him being more sedentary, so I encouraged him to start an exercise program and monitor his dietary intake.  I will see him back in  3-month.      The duration of this appointment visit was 30 minutes of face-to-face time with the patient.  Greater than 50% of this time was spent in counseling, explanation of diagnosis, planning of further management, and coordination of care.   Thank you for allowing me to participate in patient's care.  If I can answer any additional questions, I would be pleased to do so.    Sincerely,    [name] K. PPosey Pronto DO

## 2013-10-29 ENCOUNTER — Telehealth: Payer: Self-pay | Admitting: Neurology

## 2013-10-29 ENCOUNTER — Encounter: Payer: Self-pay | Admitting: *Deleted

## 2013-10-29 NOTE — Telephone Encounter (Signed)
Letter faxed to patient per his request.

## 2013-10-29 NOTE — Telephone Encounter (Signed)
Pt called requesting to speak with a nurse regarding the work restriction on his work note. Please call #[phone]

## 2013-11-04 ENCOUNTER — Encounter: Payer: Self-pay | Admitting: Neurology

## 2013-11-06 ENCOUNTER — Telehealth: Payer: Self-pay | Admitting: Neurology

## 2013-11-06 NOTE — Telephone Encounter (Signed)
Record request from [place] [employer] recv'd by mail. Forwarded via [employer] to [facility] @ [place], HIM dept / Sherri S.

## 2013-11-22 ENCOUNTER — Encounter: Payer: Self-pay | Admitting: Neurology

## 2013-11-23 ENCOUNTER — Encounter: Payer: Self-pay | Admitting: Neurology

## 2013-11-23 ENCOUNTER — Other Ambulatory Visit: Payer: Self-pay | Admitting: Neurology

## 2013-11-23 ENCOUNTER — Telehealth: Payer: Self-pay | Admitting: Neurology

## 2013-11-23 ENCOUNTER — Other Ambulatory Visit: Payer: Self-pay | Admitting: *Deleted

## 2013-11-23 DIAGNOSIS — G35 Multiple sclerosis: Secondary | ICD-10-CM

## 2013-11-23 LAB — TSH: TSH: 1.273 u[IU]/mL (ref 0.350–4.500)

## 2013-11-23 LAB — VITAMIN B12: Vitamin B-12: 924 pg/mL — ABNORMAL HIGH (ref 211–911)

## 2013-11-23 NOTE — Telephone Encounter (Signed)
Pt states that he is returning a call please call [phone]

## 2013-11-23 NOTE — Telephone Encounter (Signed)
Informed patient that Dr. Allena Katz and I both sent him messages back.  He will come in this week for labs. He has been looking up medicines and mentioned Provigil.  Just FYI.

## 2013-11-24 ENCOUNTER — Telehealth: Payer: Self-pay | Admitting: Neurology

## 2013-11-24 ENCOUNTER — Other Ambulatory Visit: Payer: Self-pay | Admitting: *Deleted

## 2013-11-24 LAB — VITAMIN D 25 HYDROXY (VIT D DEFICIENCY, FRACTURES): Vit D, 25-Hydroxy: 43 ng/mL (ref 30–89)

## 2013-11-24 MED ORDER — MODAFINIL 100 MG PO TABS: 100.0000 mg | ORAL_TABLET | Freq: Every day | ORAL | Status: DC

## 2013-11-24 NOTE — Telephone Encounter (Signed)
Spoke with patient and informed him that I got the paper work.

## 2013-11-24 NOTE — Telephone Encounter (Signed)
Pt called requesting to speak to a nurse regarding his test results and to let you know that He faxed over the paper work that you have requested. C/B [phone]

## 2013-11-25 ENCOUNTER — Telehealth: Payer: Self-pay | Admitting: Neurology

## 2013-11-25 ENCOUNTER — Telehealth: Payer: Self-pay

## 2013-11-25 NOTE — Telephone Encounter (Signed)
Ok with me to try this, thanks

## 2013-11-25 NOTE — Telephone Encounter (Signed)
Patient's work called stating that patient is requesting a folding chair for work in case he gets tired.  She said that they would not be able to accommodate him with that since he is working as a Conservation officer, naturecashier.  He gets a 15 minute break 2 hours after he arrives at work, works 2 more hours and gets an hour lunch and then works another 2 hours and gets a 15 minute break.  She said that he would be able to get 5 minutes if necessary to sit down if he gets fatigued or weak.  Informed her that I thought that should be sufficient.

## 2013-11-25 NOTE — Telephone Encounter (Signed)
Left message informing patient that forms have been filled out and I will fax them as soon as Dr. Allena KatzPatel signs them.

## 2013-11-25 NOTE — Telephone Encounter (Signed)
The patient just returned back to work.  He is taking his irbesartan in the morning around 6 am and the amlodipine at night.  He states he is "just dragging" during the day and wondered if it would be better/ok to switch and take the irbesartan at night and the amlodipine in the morning.  Advise please

## 2013-11-25 NOTE — Telephone Encounter (Signed)
Pt called f/u on the status for the forms he faxed over. Pt wants to know if the forms were completed and if he needs to fax over the forms Or if you were going to do that. C/B [phone]

## 2013-11-25 NOTE — Telephone Encounter (Signed)
Called the patient left a message of change ok per PCP.

## 2013-11-26 LAB — VITAMIN B6: Vitamin B6: 57.2 ng/mL — ABNORMAL HIGH (ref 2.1–21.7)

## 2013-11-30 ENCOUNTER — Telehealth: Payer: Self-pay | Admitting: Neurology

## 2013-11-30 NOTE — Telephone Encounter (Signed)
Dr Graciela HusbandsKlein called about this patient she was told that Dr Allena KatzPatel was out of the office until 12-08-13 and her nurse was also out of the office. I offered to let her speak to another Dr and she just wanted to leave a message her call back number is [phone]

## 2013-12-01 ENCOUNTER — Telehealth: Payer: Self-pay

## 2013-12-01 NOTE — Telephone Encounter (Signed)
The patient did switch around the time of taking his blood pressure medications as instructed ok to do per telephone note 11/25/13.  BUT, still having the same symptoms, only worse muscle weakness and fatigue.  The patient thinks the amlodipine is too strong and would like to change to amlodipine 2.5 mg?? And eventually go off amlodipine as he thinks is causing the problem.  Call back number [phone]

## 2013-12-01 NOTE — Telephone Encounter (Signed)
Called left message to call back 

## 2013-12-01 NOTE — Telephone Encounter (Signed)
I cant say since we dont have BP's to review.  Amlodipine does not normally have significant side effect he is talking about. His last episode of fatigue and complaints were at the time he was diagnosed with MS.  This would be much more likely a cause, unless he has documented BP's < 110/90 consistently, assoc with symptoms, then proven resolving symptoms with coming off.

## 2013-12-01 NOTE — Telephone Encounter (Signed)
Amlodipine does not cause sleep loss or constipation.  Please consider other causes such as less activity/excercise.

## 2013-12-01 NOTE — Telephone Encounter (Signed)
Called the patient informed MD instructions.

## 2013-12-01 NOTE — Telephone Encounter (Signed)
Patient informed of MD instructions. The patient states that amlodipine is keeping him awake to around 12 or 1 in the morning.   The patient also stated he had a BM this morning and states he is feeling better than before.  Could the lack of BM's be the cause of his fatigue, and if so what would you recommend that he could do on a regular basis to help in this area.

## 2013-12-15 ENCOUNTER — Ambulatory Visit (INDEPENDENT_AMBULATORY_CARE_PROVIDER_SITE_OTHER): Payer: BC Managed Care – PPO | Admitting: Internal Medicine

## 2013-12-15 ENCOUNTER — Telehealth: Payer: Self-pay | Admitting: Neurology

## 2013-12-15 ENCOUNTER — Telehealth: Payer: Self-pay | Admitting: Internal Medicine

## 2013-12-15 ENCOUNTER — Encounter: Payer: Self-pay | Admitting: Internal Medicine

## 2013-12-15 VITALS — BP 112/72 | HR 56 | Temp 98.4°F | Ht 67.5 in | Wt 175.2 lb

## 2013-12-15 DIAGNOSIS — Z0189 Encounter for other specified special examinations: Secondary | ICD-10-CM

## 2013-12-15 DIAGNOSIS — K439 Ventral hernia without obstruction or gangrene: Secondary | ICD-10-CM

## 2013-12-15 DIAGNOSIS — L409 Psoriasis, unspecified: Secondary | ICD-10-CM | POA: Insufficient documentation

## 2013-12-15 DIAGNOSIS — F411 Generalized anxiety disorder: Secondary | ICD-10-CM

## 2013-12-15 DIAGNOSIS — I10 Essential (primary) hypertension: Secondary | ICD-10-CM

## 2013-12-15 DIAGNOSIS — Z0000 Encounter for general adult medical examination without abnormal findings: Secondary | ICD-10-CM

## 2013-12-15 HISTORY — DX: Psoriasis, unspecified: L40.9

## 2013-12-15 HISTORY — DX: Ventral hernia without obstruction or gangrene: K43.9

## 2013-12-15 MED ORDER — FLUOCINONIDE 0.05 % EX CREA: 1.0000 "application " | TOPICAL_CREAM | Freq: Two times a day (BID) | CUTANEOUS | Status: DC

## 2013-12-15 NOTE — Assessment & Plan Note (Signed)
Small, asympt, ok to f/u, d/w pt, educated and reassured

## 2013-12-15 NOTE — Assessment & Plan Note (Signed)
stable overall by history and exam, recent data reviewed with pt, and pt to continue medical treatment as before,  to f/u any worsening symptoms or concerns  BP Readings from Last 3 Encounters:  12/15/13 112/72  10/27/13 130/70  08/26/13 118/70

## 2013-12-15 NOTE — Assessment & Plan Note (Signed)
Overall stable recently, to cont same tx, f.u with psychiatry, no SI

## 2013-12-15 NOTE — Telephone Encounter (Signed)
Patient states he would like to know why Dr. Jonny Ruiz put in anxiety state under todays diagnosis.

## 2013-12-15 NOTE — Patient Instructions (Signed)
Please take all new medication as prescribed - the steroid cream  Please continue all other medications as before, and refills have been done if requested.  Please have the pharmacy call with any other refills you may need.  Please keep your appointments with your specialists as you may have planned  Please return in 6 months, or sooner if needed, with Lab testing done 3-5 days before

## 2013-12-15 NOTE — Progress Notes (Signed)
Subjective:    Patient ID: Russell PennerBuddy Bertini Jr., male    DOB: 07-13-1970, 43 y.o.   MRN: [id number]  HPI  Here to f/u, with recent psoriasis returning rash to MCp's and PIP hands bilat worse in the past few months,and small fleeting pains to the MCP's and PIP, worse to shake hands. No joint pain.   Has had mult tx in past with orals,topicals with derm, has been more quiescent in recent years.  Also with rash to elbows recently, used to be also involving scalp and behind ears.  Not better with neutrogena moist lotion. Worries about toxins in the body making things worse.  Pt denies chest pain, increased sob or doe, wheezing, orthopnea, PND, increased LE swelling, palpitations, dizziness or syncope. Feels as if his Bp meds make him feel chilly sometimes.  Tends to have some fatigue during day which he tries very hard to acribe to ups and down of his BP - BP at home BI recently with approx sbp 120-142.  States feels more energized and clarity after bowel movements.  Extremely diligent about being exact with his med admin's and doing the copoxone injections 3 times weekly.   Very interested in the biochemistry of the med and has done quite a bit of online research, feels it does help to push those toxins out of the kidneys.Denies worsening depressive symptoms, suicidal ideation, or panic; has ongoing anxiety, not increased recently. Has gaines some wt, which he blames on his BP med and lack of time due to working every day. Does try to watch diet somewhat and ellipitcal home and thinking about starting to use.   Did also notice a outpouching of the mid abd superior to the umbilicus with lyging down and trying to sit up, no pain. Now for several months,  Past Medical History  Diagnosis Date  . BIPOLAR DISORDER UNSPECIFIED 04/18/2009    Qualifier: Diagnosis of  By: Russell Moses, Russell Moses   . ASTHMA, CHILDHOOD 04/18/2009    Qualifier: Diagnosis of  By: Russell Moses, Russell Moses   . Hypertension   . Insomnia   . Multiple  sclerosis 2015  . GERD (gastroesophageal reflux disease) 07/30/2013   Past Surgical History  Procedure Laterality Date  . Hernia repair      reports that he has never smoked. He does not have any smokeless tobacco history on file. He reports that he does not drink alcohol or use illicit drugs. family history includes COPD in his sister; Diabetes in his father; Fibromyalgia in his sister; Healthy in his sister; Heart disease in his father; Hypertension in his father and mother; Stroke in his father. Allergies  Allergen Reactions  . Penicillins Itching and Rash    Little bumps   Current Outpatient Prescriptions on File Prior to Visit  Medication Sig Dispense Refill  . amLODipine (NORVASC) 5 MG tablet Take 1 tablet (5 mg total) by mouth daily. 90 tablet 3  . B Complex-Biotin-FA (B COMPLETE PO) Take 15 mLs by mouth 2 (two) times daily.    Marland Kitchen. Glatiramer Acetate (COPAXONE [place]) Inject into the skin.    Marland Kitchen. irbesartan (AVAPRO) 150 MG tablet Take 1 tablet (150 mg total) by mouth daily. 90 tablet 3  . modafinil (PROVIGIL) 100 MG tablet Take 1 tablet (100 mg total) by mouth daily. 30 tablet 3  . omeprazole (PRILOSEC) 20 MG capsule 2 tabs by mouth per day 60 capsule 11   No current facility-administered medications on file prior to visit.  Review of Systems  Constitutional: Negative for unusual diaphoresis or other sweats  HENT: Negative for ringing in ear Eyes: Negative for double vision or worsening visual disturbance.  Respiratory: Negative for choking and stridor.   Gastrointestinal: Negative for vomiting or other signifcant bowel change Genitourinary: Negative for hematuria or decreased urine volume.  Musculoskeletal: Negative for other MSK pain or swelling Skin: Negative for color change and worsening wound.  Neurological: Negative for tremors and numbness other than noted  Psychiatric/Behavioral: Negative for decreased concentration or agitation other than above       Objective:    Physical Exam BP 112/72 mmHg  Pulse 56  Temp(Src) 98.4 F (36.9 C) (Oral)  Ht 5' 7.5" (1.715 m)  Wt 175 lb 4 oz (79.493 kg)  BMI 27.03 kg/m2  SpO2 95% VS noted,  Constitutional: Pt appears well-developed, well-nourished.  HENT: Head: NCAT.  Right Ear: External ear normal.  Left Ear: External ear normal.  Eyes: . Pupils are equal, round, and reactive to light. Conjunctivae and EOM are normal Neck: Normal range of motion. Neck supple.  Cardiovascular: Normal rate and regular rhythm.   Pulmonary/Chest: Effort normal and breath sounds normal.  Abd:  Soft, NT, ND, + BS, small ventral hernia noted ,NT Neurological: Pt is alert. Not confused , motor grossly intact Skin: Skin is warm. No rash except for typical silvery NT erythema to extensor hands Psychiatric: Pt behavior is normal. No agitation.  BP Readings from Last 3 Encounters:  12/15/13 112/72  10/27/13 130/70  08/26/13 118/70       Assessment & Plan:

## 2013-12-15 NOTE — Progress Notes (Signed)
Pre visit review using our clinic review tool, if applicable. No additional management support is needed unless otherwise documented below in the visit note. 

## 2013-12-15 NOTE — Telephone Encounter (Signed)
Pt called to see if the TROBIGIL script was sent in to his pharmacy. C/B [phone]

## 2013-12-15 NOTE — Assessment & Plan Note (Signed)
For topical steroid as rx,  to f/u any worsening symptoms or concerns, consider derm

## 2013-12-16 ENCOUNTER — Telehealth: Payer: Self-pay | Admitting: Internal Medicine

## 2013-12-16 NOTE — Telephone Encounter (Signed)
Spoke with patient and he said that pharmacy did not have the Provigil.  Informed him that I faxed Rx in but that I would check with them and see what is going on.  Called the pharmacy and they said that Rx needs PA.  [employer]ed insurance company and got it approved.  Instructed patient to call pharmacy tomorrow to check on Rx.

## 2013-12-16 NOTE — Telephone Encounter (Signed)
emmi emailed °

## 2013-12-22 ENCOUNTER — Encounter: Payer: Self-pay | Admitting: Neurology

## 2013-12-23 ENCOUNTER — Other Ambulatory Visit: Payer: Self-pay | Admitting: *Deleted

## 2013-12-23 ENCOUNTER — Telehealth: Payer: Self-pay | Admitting: Neurology

## 2013-12-23 DIAGNOSIS — G35 Multiple sclerosis: Secondary | ICD-10-CM

## 2013-12-23 NOTE — Telephone Encounter (Signed)
Pt called again wanting to ask you one more questions he forgot to ask you regarding previous conversation. Please call him back # [phone]

## 2013-12-23 NOTE — Telephone Encounter (Signed)
Pt calling to confirm we received his MyChart message. Please call back at confirmed CB# [phone] / Sherri S.

## 2013-12-24 ENCOUNTER — Other Ambulatory Visit: Payer: Self-pay | Admitting: *Deleted

## 2013-12-24 MED ORDER — DIAZEPAM 5 MG PO TABS: 5.0000 mg | ORAL_TABLET | Freq: Four times a day (QID) | ORAL | Status: DC | PRN

## 2013-12-24 NOTE — Telephone Encounter (Signed)
Pt called again at 3:41 stating that no one has called him regarding message below. Please call pt # [phone]

## 2013-12-24 NOTE — Telephone Encounter (Signed)
Pt called requesting to speak to a nurse regarding his MRI tomorrow. Pt wants to know if he needs to bring his MRI disk after his appt. C/B [phone]

## 2013-12-25 ENCOUNTER — Other Ambulatory Visit: Payer: Self-pay | Admitting: Neurology

## 2013-12-25 ENCOUNTER — Telehealth: Payer: Self-pay | Admitting: Neurology

## 2013-12-25 ENCOUNTER — Other Ambulatory Visit: Payer: Self-pay | Admitting: *Deleted

## 2013-12-25 DIAGNOSIS — G35 Multiple sclerosis: Secondary | ICD-10-CM

## 2013-12-25 DIAGNOSIS — Z79899 Other long term (current) drug therapy: Secondary | ICD-10-CM

## 2013-12-25 DIAGNOSIS — R109 Unspecified abdominal pain: Secondary | ICD-10-CM

## 2013-12-25 LAB — CBC
HCT: 43.3 % (ref 39.0–52.0)
Hemoglobin: 13.8 g/dL (ref 13.0–17.0)
MCH: 30.7 pg (ref 26.0–34.0)
MCHC: 31.9 g/dL (ref 30.0–36.0)
MCV: 96.4 fL (ref 78.0–100.0)
Platelets: 195 K/uL (ref 150–400)
RBC: 4.5 MIL/uL (ref 4.22–5.81)
RDW: 13.1 % (ref 11.5–15.5)
WBC: 5.3 K/uL (ref 4.0–10.5)

## 2013-12-25 LAB — COMPREHENSIVE METABOLIC PANEL
ALK PHOS: 107 U/L (ref 39–117)
ALT: 54 U/L — AB (ref 0–53)
AST: 31 U/L (ref 0–37)
Albumin: 3.8 g/dL (ref 3.5–5.2)
BILIRUBIN TOTAL: 0.4 mg/dL (ref 0.2–1.2)
BUN: 10 mg/dL (ref 6–23)
CALCIUM: 9.9 mg/dL (ref 8.4–10.5)
CO2: 28 mEq/L (ref 19–32)
Chloride: 104 mEq/L (ref 96–112)
Creat: 0.9 mg/dL (ref 0.50–1.35)
Glucose, Bld: 98 mg/dL (ref 70–99)
Potassium: 4.2 mEq/L (ref 3.5–5.3)
Sodium: 140 mEq/L (ref 135–145)
Total Protein: 6.9 g/dL (ref 6.0–8.3)

## 2013-12-25 NOTE — Telephone Encounter (Signed)
Let's actually have him bring the disc so that I can review the images myself and ok to order CBC and CMP for his abdominal discomfort.  [name] K. Allena KatzPatel, DO

## 2013-12-25 NOTE — Telephone Encounter (Signed)
Spoke with patient and informed him that we do not need the disk since they will send [place] a report.  He is wondering if he needs his enzymes checked again since he is having pain in his abdomen.  Please advise.  Thanks.

## 2013-12-25 NOTE — Telephone Encounter (Addendum)
MRI brain wwo contrast 12/25/2013:  Multiple lesoins in the periventricular and juxtacortical white matter regions bilaterally.  There is no longer enhancement or diffusion abnormality.  No new lesions MRI cervical spine wwo contrast:  Normal  Morrie Sheldonshley, please inform patient MRI brain and labs are stable.  Redell Bhandari K. Allena KatzPatel, DO

## 2013-12-25 NOTE — Telephone Encounter (Signed)
Patient will drop off disk and have labs drawn today.

## 2013-12-28 NOTE — Telephone Encounter (Signed)
Called patient and let him know that MRI and labs are stable.

## 2014-01-21 ENCOUNTER — Telehealth: Payer: Self-pay | Admitting: Neurology

## 2014-01-21 NOTE — Telephone Encounter (Signed)
trish from [facility] medical records is calling with questions about [facility] please call [phone]

## 2014-01-22 NOTE — Telephone Encounter (Signed)
Called Trish back.  She was interrupted and said that she would call me back.

## 2014-01-25 ENCOUNTER — Telehealth: Payer: Self-pay | Admitting: *Deleted

## 2014-01-25 NOTE — Telephone Encounter (Signed)
I spoke with Russell Moses and she requested for me to fax dx to her.  Notes and orders faxed to [phone].

## 2014-01-25 NOTE — Telephone Encounter (Signed)
Please call [phone] Bethann Berkshire at [facility] in reference to a order and codes for billing

## 2014-01-26 ENCOUNTER — Telehealth: Payer: Self-pay | Admitting: Neurology

## 2014-01-26 ENCOUNTER — Encounter: Payer: Self-pay | Admitting: *Deleted

## 2014-01-26 NOTE — Telephone Encounter (Signed)
Order faxed.

## 2014-01-26 NOTE — Telephone Encounter (Signed)
Russell Moses from [employer] needs a written order from Dr Allena Katz her phone number is [phone]

## 2014-02-18 ENCOUNTER — Other Ambulatory Visit: Payer: Self-pay

## 2014-02-18 MED ORDER — OMEPRAZOLE 20 MG PO CPDR: DELAYED_RELEASE_CAPSULE | ORAL | Status: DC

## 2014-02-18 MED ORDER — IRBESARTAN 150 MG PO TABS: 150.0000 mg | ORAL_TABLET | Freq: Every day | ORAL | Status: DC

## 2014-02-18 MED ORDER — AMLODIPINE BESYLATE 5 MG PO TABS: 5.0000 mg | ORAL_TABLET | Freq: Every day | ORAL | Status: DC

## 2014-02-18 MED ORDER — FLUOCINONIDE 0.05 % EX CREA: 1.0000 "application " | TOPICAL_CREAM | Freq: Two times a day (BID) | CUTANEOUS | Status: DC

## 2014-02-25 ENCOUNTER — Telehealth: Payer: Self-pay | Admitting: Neurology

## 2014-02-25 ENCOUNTER — Encounter: Payer: Self-pay | Admitting: Neurology

## 2014-02-25 NOTE — Telephone Encounter (Signed)
Pt needs to talk to [facility] about work note please call [phone]

## 2014-02-25 NOTE — Telephone Encounter (Signed)
Patient called requesting a letter stating that he can work 38-40 hours a week.  Letter faxed per his request.

## 2014-02-26 ENCOUNTER — Ambulatory Visit: Payer: BC Managed Care – PPO | Admitting: Neurology

## 2014-03-16 ENCOUNTER — Ambulatory Visit (INDEPENDENT_AMBULATORY_CARE_PROVIDER_SITE_OTHER): Payer: BLUE CROSS/BLUE SHIELD | Admitting: Neurology

## 2014-03-16 ENCOUNTER — Encounter: Payer: Self-pay | Admitting: Neurology

## 2014-03-16 VITALS — BP 118/76 | HR 62 | Ht 67.0 in | Wt 176.0 lb

## 2014-03-16 DIAGNOSIS — G35 Multiple sclerosis: Secondary | ICD-10-CM

## 2014-03-16 DIAGNOSIS — E663 Overweight: Secondary | ICD-10-CM

## 2014-03-16 LAB — COMPREHENSIVE METABOLIC PANEL
ALBUMIN: 3.8 g/dL (ref 3.5–5.2)
ALT: 47 U/L (ref 0–53)
AST: 26 U/L (ref 0–37)
Alkaline Phosphatase: 92 U/L (ref 39–117)
BUN: 12 mg/dL (ref 6–23)
CALCIUM: 9.2 mg/dL (ref 8.4–10.5)
CHLORIDE: 106 meq/L (ref 96–112)
CO2: 26 meq/L (ref 19–32)
Creat: 0.8 mg/dL (ref 0.50–1.35)
Glucose, Bld: 91 mg/dL (ref 70–99)
Potassium: 4.4 mEq/L (ref 3.5–5.3)
Sodium: 139 mEq/L (ref 135–145)
Total Bilirubin: 0.4 mg/dL (ref 0.2–1.2)
Total Protein: 6.5 g/dL (ref 6.0–8.3)

## 2014-03-16 LAB — CBC
HEMATOCRIT: 44.4 % (ref 39.0–52.0)
HEMOGLOBIN: 14.8 g/dL (ref 13.0–17.0)
MCH: 29.6 pg (ref 26.0–34.0)
MCHC: 33.3 g/dL (ref 30.0–36.0)
MCV: 88.8 fL (ref 78.0–100.0)
MPV: 9.9 fL (ref 8.6–12.4)
Platelets: 207 10*3/uL (ref 150–400)
RBC: 5 MIL/uL (ref 4.22–5.81)
RDW: 13.3 % (ref 11.5–15.5)
WBC: 5.8 10*3/uL (ref 4.0–10.5)

## 2014-03-16 NOTE — Progress Notes (Signed)
Follow-up Visit   Date: 03/16/2014    Russell Moses. MRN: [id number] DOB: 01/30/1971   Interim History: Russell Moses. is a 44 y.o. right-handed Caucasian male with history of hypertension, bipolar disorder, asthma, and anxiety returning to the clinic for follow-up of multiple sclerosis.  The patient was accompanied to the clinic by mother who also provides collateral information.    History of present illness: In 2014, he developed a tingling sensation over his head and get "swimming headed", which lasted a few seconds and spontaneously resolved. He felt that his blood pressure was going up so went to see. [name], his PCP. Starting in February 2015, he developed tremors of the hands and bilateral leg weakness. He feels that he gets fatigued easily. On March 5th 2015, he was at work and started shaking, lost his energy, felt as if his head was heavy, and was short of breath so called his mom who took him to [facility]. He has CT head and lab work was unrevealing. He was discharged once he felt better.  He also complains of seeing black spots and little spots in his eyes and left sided throbbing pain over his head, lasting about a minute. He stopped working since March 5th 2015 and has been at home resting.    - Follow-up 06/12/2013:  He had EMG performed of the right side showed mild L5 radiculopathy bilaterally.  No evidence of MG or myopathy on his lab testing.  His MRI brain wwo contrast shows white matter changes concerning for demyelinating disease.  He continues to have worsening shakiness of the hands, episodic weakness, and fatigues quickly.  He has developed new agoraphobia and is scheduled to see a psychiatrist for this.  He is not working at this time.   - Follow-up 07/15/2013:  Patient underwent CSF testing which was which shows signs of inflammation and has completed 3-days of IVMP (5/28, 5/29 and 5/30).  Since starting steroids, he is able to function and thinking is  better ("less foggy").  His mother says that he is less sedentary and is able to walk longer distances without getting fatigued as easily.   - Follow-up 08/26/2013:  He has not returned to work because they do not have work based on his restrictions.   He complains of intermittent tightness of his arm and legs, especially with over exertion such as climbing stairs.  He is driving more and walking about 3-5 laps daily.     - Follow-up 10/27/2013:  He started copaxone on 9/2 and is tolerating it well.  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  He completed functional capacity assessment which indicated he can perform light work.  No new neurological complaints.  Mood is good.  He has gained almost 20lb this summer.  His mom says that he sang in church on Sunday, which is new since the last time he did that was 66-month ago!  UPDATE 03/16/2014:  He is feeling and and is back to working ~32 hours per week.  He is more alert during the day and is only taking his provigil as needed.  He is cautious with what he eats and trying to cut carbs.  He has no new complaints today.  He is staying active and walks on his treadmill about 21m several times per week.  Interval MRI brain and c-spine from 12/2013 shows no new lesions and resolution of enhancement.  He is very talkative today.  Mood is great.  Medications:  Current Outpatient Prescriptions on File Prior to Visit  Medication Sig Dispense Refill  . amLODipine (NORVASC) 5 MG tablet Take 1 tablet (5 mg total) by mouth daily. 30 tablet 11  . fluocinonide cream (LIDEX) 1.61 % Apply 1 application topically 2 (two) times daily. 30 g 0  . Glatiramer Acetate (COPAXONE [place]) Inject into the skin.    Marland Kitchen irbesartan (AVAPRO) 150 MG tablet Take 1 tablet (150 mg total) by mouth daily. 30 tablet 11  . modafinil (PROVIGIL) 100 MG tablet Take 1 tablet (100 mg total) by mouth daily. (Patient taking differently: Take 100  mg by mouth as needed. ) 30 tablet 3  . omeprazole (PRILOSEC) 20 MG capsule 2 tabs by mouth per day 60 capsule 11   No current facility-administered medications on file prior to visit.    Allergies:  Allergies  Allergen Reactions  . Penicillins Itching and Rash    Little bumps     Review of Systems:  CONSTITUTIONAL: No fevers, chills, night sweats, or weight loss.   EYES: No visual changes or eye pain ENT: No hearing changes.  No history of nose bleeds.   RESPIRATORY: No cough, wheezing and shortness of breath.   CARDIOVASCULAR: Negative for chest pain, and palpitations.   GI: Negative for abdominal discomfort, blood in stools or black stools.  No recent change in bowel habits.   GU:  No history of incontinence.   MUSCLOSKELETAL: No history of joint pain or swelling.  No myalgias.   SKIN: Negative for lesions, rash, and itching.   ENDOCRINE: Negative for cold or heat intolerance, polydipsia or goiter.   PSYCH: No depression or anxiety symptoms.   NEURO: As Above.   Vital Signs:  BP 118/76 mmHg  Pulse 62  Ht '5\' 7"'  (1.702 m)  Wt 176 lb (79.833 kg)  BMI 27.56 kg/m2  SpO2 96%   Neurological Exam: MENTAL STATUS including orientation to time, place, person, recent and remote memory, attention span and concentration, language, and fund of knowledge is normal.  Speech is not dysarthric.  CRANIAL NERVES:  Pupils equal round and reactive to light.  Normal conjugate, extra-ocular eye movements in all directions of gaze. No ptosis. Normal facial sensation.  Face is symmetric. Palate elevates symmetrically.  Tongue is midline.  MOTOR:  Motor strength is 5/5 in all extremities.  No tremor today. No pronator drift.  Tone is normal.    MSRs:   Right      Left  brachioradialis  2+   brachioradialis  2+   biceps  2+   biceps  2+   triceps  2+   triceps  2+   patellar  2+   patellar  2+   ankle jerk  2+   ankle jerk  2+   Hoffman  no   Hoffman  no   plantar response  up  plantar  response  down    SENSORY:  Intact to vibration and light touch throughout.  COORDINATION/GAIT:  Gait narrow based and stable. Stressed gait intact.    Data: MRI brain wwo contrast 12/25/2013: Multiple lesoins in the periventricular and juxtacortical white matter regions bilaterally. There is no longer enhancement or diffusion abnormality. No new lesions MRI cervical spine wwo contrast: Normal  CT head 04/09/2013: Chronic microvascular changes.  MRI brain wwo contrast 05/28/2013:  Multiple lesions in the periventricular and juxtacortical white matter bilaterally, one of which enhances.  The pattern and  distribution of these lesions suggest demyelinating disease such as multiple sclerosis.   MRI cervical spine wwo contrast 06/04/2013:  Normal MRI cervical spine   EMG right upper and lower extremity 05/14/2013: There is electrophysiological evidence of an intraspinal canal lesion (i.e. radiculopathy) affecting the L5 nerve root/segment bilaterally. Overall, these changes are mild in degree electrically and worse on the right side.   There is no evidence of a generalized sensorimotor polyneuropathy, cervical radiculopathy or diffuse myopathy.   Labs 03/27/2013:  TSH 0.73, B12 847, ESR 14, vitamin D 37 Labs 05/14/2013:  CK 26, aldolase 26, copper 91, MG panel negative  CSF 06/22/2013: R0 W0 G61 P27  ACE 4, Lyme neg, MBP 61, IgG 1.6 OCB The patient's CSF contains >5 well defined gamma restriction bands that are also present in the patient's  corresponding serum sample, but some bands in the CSF are more prominent.  CSF cytology with few mononuclear cells CSF cytometry was not performed due to insufficient sample.   IMPRESSION/PLAN: Mr. Purtee is a 44 year-old gentleman returning to the clinic with multiple sclerosis (diagnosed May 2015).  His manifesting symptoms included generalized fatigue and tremors. Imaging of his brain shows periventricular and juxtacortical white matter bilaterally, one of  which enhances.  Cervical cord is normal.  CSF testing showed inflammatory changes with oligoclonal bands present. Due to clinical suspicion of multiple sclerosis, he completed 3-days of IVMP (5/28-5/30) with significant improvement.  He started Copaxone on 10/07/2013.  Today, he is doing well and looks great. He is tolerating Copaxone well and has noticed mild thinning of the subcutaneous fat in the arms, which I will continue to monitor.  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  He should continue taking vitamin D 4000U daily.   Recommend annual eye exam.  Check CBC and CMP  Praised him for his exercise program and encouraged him to continue   I will see him back in 44-month with repeat MRI brain and cervical at that time.   The duration of this appointment visit was 30 minutes of face-to-face time with the patient.  Greater than 50% of this time was spent in counseling, explanation of diagnosis, planning of further management, and coordination of care.   Thank you for allowing me to participate in patient's care.  If I can answer any additional questions, I would be pleased to do so.    Sincerely,    [name] K. PPosey Pronto DO

## 2014-03-16 NOTE — Patient Instructions (Signed)
You're looking great!! Keep up the good work. Continue your medications as you're taking them. Please have your eyes checked. I'll see you back in 17-months.

## 2014-03-17 ENCOUNTER — Encounter: Payer: Self-pay | Admitting: *Deleted

## 2014-04-13 ENCOUNTER — Telehealth: Payer: Self-pay

## 2014-04-13 ENCOUNTER — Other Ambulatory Visit: Payer: Self-pay

## 2014-04-13 DIAGNOSIS — Z0000 Encounter for general adult medical examination without abnormal findings: Secondary | ICD-10-CM

## 2014-04-13 NOTE — Telephone Encounter (Signed)
Patient called this morning wanting to know if he could come in tomorrow for blood work for his upcoming physical. Per his last visit with Dr. Jonny Ruiz, Dr. Jonny Ruiz told him that he could come back to the office at any time for labs.

## 2014-04-14 ENCOUNTER — Other Ambulatory Visit (INDEPENDENT_AMBULATORY_CARE_PROVIDER_SITE_OTHER): Payer: BLUE CROSS/BLUE SHIELD

## 2014-04-14 DIAGNOSIS — Z0000 Encounter for general adult medical examination without abnormal findings: Secondary | ICD-10-CM

## 2014-04-14 DIAGNOSIS — Z0189 Encounter for other specified special examinations: Secondary | ICD-10-CM

## 2014-04-14 LAB — CBC WITH DIFFERENTIAL/PLATELET
BASOS ABS: 0 10*3/uL (ref 0.0–0.1)
Basophils Relative: 0.5 % (ref 0.0–3.0)
Eosinophils Absolute: 0.3 10*3/uL (ref 0.0–0.7)
Eosinophils Relative: 5.6 % — ABNORMAL HIGH (ref 0.0–5.0)
HCT: 41.5 % (ref 39.0–52.0)
Hemoglobin: 14.4 g/dL (ref 13.0–17.0)
Lymphocytes Relative: 26.9 % (ref 12.0–46.0)
Lymphs Abs: 1.4 10*3/uL (ref 0.7–4.0)
MCHC: 34.6 g/dL (ref 30.0–36.0)
MCV: 86.2 fl (ref 78.0–100.0)
Monocytes Absolute: 0.6 10*3/uL (ref 0.1–1.0)
Monocytes Relative: 11.1 % (ref 3.0–12.0)
NEUTROS PCT: 55.9 % (ref 43.0–77.0)
Neutro Abs: 2.8 10*3/uL (ref 1.4–7.7)
Platelets: 174 10*3/uL (ref 150.0–400.0)
RBC: 4.82 Mil/uL (ref 4.22–5.81)
RDW: 13.9 % (ref 11.5–15.5)
WBC: 5 10*3/uL (ref 4.0–10.5)

## 2014-04-14 LAB — URINALYSIS, ROUTINE W REFLEX MICROSCOPIC
Bilirubin Urine: NEGATIVE
Hgb urine dipstick: NEGATIVE
Ketones, ur: NEGATIVE
Leukocytes, UA: NEGATIVE
Nitrite: NEGATIVE
Specific Gravity, Urine: 1.025 (ref 1.000–1.030)
Total Protein, Urine: NEGATIVE
Urine Glucose: NEGATIVE
Urobilinogen, UA: 0.2 (ref 0.0–1.0)
WBC UA: NONE SEEN — AB (ref 0–?)
pH: 6 (ref 5.0–8.0)

## 2014-04-14 LAB — BASIC METABOLIC PANEL
BUN: 10 mg/dL (ref 6–23)
CALCIUM: 9.3 mg/dL (ref 8.4–10.5)
CHLORIDE: 106 meq/L (ref 96–112)
CO2: 27 mEq/L (ref 19–32)
CREATININE: 0.86 mg/dL (ref 0.40–1.50)
GFR: 102.25 mL/min (ref >60.00)
GLUCOSE: 107 mg/dL — AB (ref 70–99)
Potassium: 4.2 mEq/L (ref 3.5–5.1)
Sodium: 138 mEq/L (ref 135–145)

## 2014-04-14 LAB — HEPATIC FUNCTION PANEL
ALK PHOS: 99 U/L (ref 39–117)
ALT: 41 U/L (ref 0–53)
AST: 26 U/L (ref 0–37)
Albumin: 3.8 g/dL (ref 3.5–5.2)
BILIRUBIN DIRECT: 0.1 mg/dL (ref 0.0–0.3)
Total Bilirubin: 0.5 mg/dL (ref 0.2–1.2)
Total Protein: 6.3 g/dL (ref 6.0–8.3)

## 2014-04-14 LAB — LIPID PANEL
CHOLESTEROL: 144 mg/dL (ref 0–200)
HDL: 34.4 mg/dL — AB (ref >39.00)
LDL CALC: 88 mg/dL (ref 0–99)
NonHDL: 109.6
Total CHOL/HDL Ratio: 4
Triglycerides: 108 mg/dL (ref 0.0–149.0)
VLDL: 21.6 mg/dL (ref 0.0–40.0)

## 2014-04-14 LAB — TSH: TSH: 1.4 u[IU]/mL (ref 0.35–4.50)

## 2014-04-14 LAB — PSA: PSA: 0.52 ng/mL (ref 0.10–4.00)

## 2014-06-15 ENCOUNTER — Encounter: Payer: Self-pay | Admitting: Internal Medicine

## 2014-06-15 ENCOUNTER — Ambulatory Visit (INDEPENDENT_AMBULATORY_CARE_PROVIDER_SITE_OTHER): Payer: BLUE CROSS/BLUE SHIELD | Admitting: Internal Medicine

## 2014-06-15 VITALS — BP 142/64 | HR 64 | Temp 98.2°F | Wt 178.0 lb

## 2014-06-15 DIAGNOSIS — Z0000 Encounter for general adult medical examination without abnormal findings: Secondary | ICD-10-CM

## 2014-06-15 NOTE — Progress Notes (Signed)
Subjective:    Patient ID: Russell Moses., male    DOB: Dec 20, 1970, 44 y.o.   MRN: [id number]  HPI  Here for wellness and f/u;  Overall doing ok;  Pt denies Chest pain, worsening SOB, DOE, wheezing, orthopnea, PND, worsening LE edema, palpitations, dizziness or syncope.  Pt denies neurological change such as new headache, facial or extremity weakness.  Pt denies polydipsia, polyuria, or low sugar symptoms. Pt states overall good compliance with treatment and medications, good tolerability, and has been trying to follow appropriate diet.  Pt denies worsening depressive symptoms, suicidal ideation or panic. No fever, night sweats, wt loss, loss of appetite, or other constitutional symptoms.  Pt states good ability with ADL's, has low fall risk, home safety reviewed and adequate, no other significant changes in hearing or vision, and only occasionally active with exercise.  Declines immunizations.  Does have several wks ongoing nasal allergy symptoms with clearish congestion, itch and sneezing, without fever, pain, ST, cough, swelling or wheezing.  Did have URI/bronchitis in march, with some late flu and pna per pt, tx with shot and zpack. Doing ok overall with OTC meds BP Readings from Last 3 Encounters:  06/15/14 142/64  03/16/14 118/76  12/15/13 112/72   Past Medical History  Diagnosis Date  . BIPOLAR DISORDER UNSPECIFIED 04/18/2009    Qualifier: Diagnosis of  By: Jonny Ruiz MD, Len Blalock   . ASTHMA, CHILDHOOD 04/18/2009    Qualifier: Diagnosis of  By: Jonny Ruiz MD, Len Blalock   . Hypertension   . Insomnia   . Multiple sclerosis 2015  . GERD (gastroesophageal reflux disease) 07/30/2013  . Psoriasis 12/15/2013  . Ventral hernia without obstruction or gangrene 12/15/2013   Past Surgical History  Procedure Laterality Date  . Hernia repair      reports that he has never smoked. He does not have any smokeless tobacco history on file. He reports that he does not drink alcohol or use illicit drugs. family  history includes COPD in his sister; Diabetes in his father; Fibromyalgia in his sister; Healthy in his sister; Heart disease in his father; Hypertension in his father and mother; Stroke in his father. Allergies  Allergen Reactions  . Penicillins Itching and Rash    Little bumps   Current Outpatient Prescriptions on File Prior to Visit  Medication Sig Dispense Refill  . amLODipine (NORVASC) 5 MG tablet Take 1 tablet (5 mg total) by mouth daily. 30 tablet 11  . fluocinonide cream (LIDEX) 0.05 % Apply 1 application topically 2 (two) times daily. 30 g 0  . Glatiramer Acetate (COPAXONE [place]) Inject into the skin.    Marland Kitchen irbesartan (AVAPRO) 150 MG tablet Take 1 tablet (150 mg total) by mouth daily. 30 tablet 11  . modafinil (PROVIGIL) 100 MG tablet Take 1 tablet (100 mg total) by mouth daily. (Patient taking differently: Take 100 mg by mouth as needed. ) 30 tablet 3  . omeprazole (PRILOSEC) 20 MG capsule 2 tabs by mouth per day 60 capsule 11   No current facility-administered medications on file prior to visit.    Review of Systems Constitutional: Negative for increased diaphoresis, other activity, appetite or siginficant weight change other than noted HENT: Negative for worsening hearing loss, ear pain, facial swelling, mouth sores and neck stiffness.   Eyes: Negative for other worsening pain, redness or visual disturbance.  Respiratory: Negative for shortness of breath and wheezing  Cardiovascular: Negative for chest pain and palpitations.  Gastrointestinal: Negative for diarrhea, blood in stool, abdominal  distention or other pain Genitourinary: Negative for hematuria, flank pain or change in urine volume.  Musculoskeletal: Negative for myalgias or other joint complaints.  Skin: Negative for color change and wound or drainage.  Neurological: Negative for syncope and numbness. other than noted Hematological: Negative for adenopathy. or other swelling Psychiatric/Behavioral: Negative for  hallucinations, SI, self-injury, decreased concentration or other worsening agitation.      Objective:   Physical Exam BP 142/64 mmHg  Pulse 64  Temp(Src) 98.2 F (36.8 C) (Oral)  Wt 178 lb (80.74 kg)  SpO2 96% VS noted,  Constitutional: Pt is oriented to person, place, and time. Appears well-developed and well-nourished, in no significant distress Head: Normocephalic and atraumatic.  Right Ear: External ear normal.  Left Ear: External ear normal.  Nose: Nose normal.  Mouth/Throat: Oropharynx is clear and moist.  Bilat tm's with mild erythema.  Max sinus areas mild tender.  Pharynx with mild erythema, no exudate Eyes: Conjunctivae and EOM are normal. Pupils are equal, round, and reactive to light.  Neck: Normal range of motion. Neck supple. No JVD present. No tracheal deviation present or significant neck LA or mass Cardiovascular: Normal rate, regular rhythm, normal heart sounds and intact distal pulses.   Pulmonary/Chest: Effort normal and breath sounds without rales or wheezing  Abdominal: Soft. Bowel sounds are normal. NT. No HSM  Musculoskeletal: Normal range of motion. Exhibits no edema.  Lymphadenopathy:  Has no cervical adenopathy.  Neurological: Pt is alert and oriented to person, place, and time. Pt has normal reflexes. No cranial nerve deficit. Motor grossly intact Skin: Skin is warm and dry. No rash noted.  Psychiatric:  Has normal mood and affect. Behavior is normal.     Assessment & Plan:

## 2014-06-15 NOTE — Progress Notes (Signed)
Pre visit review using our clinic review tool, if applicable. No additional management support is needed unless otherwise documented below in the visit note. 

## 2014-06-15 NOTE — Assessment & Plan Note (Signed)

## 2014-06-15 NOTE — Patient Instructions (Signed)
Please continue all other medications as before, and refills have been done if requested.  Please have the pharmacy call with any other refills you may need.  Please continue your efforts at being more active, low cholesterol diet, and weight control.  You are otherwise up to date with prevention measures today.  Please keep your appointments with your specialists as you may have planned  Please return in 6 months, or sooner if needed 

## 2014-08-10 ENCOUNTER — Other Ambulatory Visit: Payer: Self-pay | Admitting: Neurology

## 2014-08-10 NOTE — Telephone Encounter (Signed)
Rx sent 

## 2014-09-14 ENCOUNTER — Ambulatory Visit: Payer: BLUE CROSS/BLUE SHIELD | Admitting: Neurology

## 2014-09-15 ENCOUNTER — Ambulatory Visit (INDEPENDENT_AMBULATORY_CARE_PROVIDER_SITE_OTHER): Payer: BLUE CROSS/BLUE SHIELD | Admitting: Neurology

## 2014-09-15 ENCOUNTER — Encounter: Payer: Self-pay | Admitting: Neurology

## 2014-09-15 VITALS — BP 110/70 | HR 60 | Ht 67.0 in | Wt 178.1 lb

## 2014-09-15 DIAGNOSIS — G35 Multiple sclerosis: Secondary | ICD-10-CM

## 2014-09-15 NOTE — Progress Notes (Signed)
Follow-up Visit   Date: 09/15/2014    Samier Jaco. MRN: [id number] DOB: 1970-10-10   Interim History: Nirvan Gerhard Rappaport. is a 44 y.o. right-handed Caucasian male with history of hypertension, bipolar disorder, asthma, and anxiety returning to the clinic for follow-up of multiple sclerosis.  The patient was accompanied to the clinic by mother who also provides collateral information.    History of present illness: In 2014, he developed a tingling sensation over his head and get "swimming headed", which lasted a few seconds and spontaneously resolved. He felt that his blood pressure was going up so went to see. [name], his PCP. Starting in February 2015, he developed tremors of the hands and bilateral leg weakness. He feels that he gets fatigued easily. On March 5th 2015, he was at work and started shaking, lost his energy, felt as if his head was heavy, and was short of breath so called his mom who took him to [facility]. He has CT head and lab work was unrevealing. He was discharged once he felt better.  He also complains of seeing black spots and little spots in his eyes and left sided throbbing pain over his head, lasting about a minute. He stopped working since March 5th 2015 and has been at home resting.    - Follow-up 06/12/2013: His MRI brain wwo contrast shows white matter changes concerning for demyelinating disease.  He continues to have worsening shakiness of the hands, episodic weakness, and fatigues quickly.  He has developed new agoraphobia and is scheduled to see a psychiatrist for this.  He is not working at this time.   - Follow-up 07/15/2013:  Patient underwent CSF testing which was which shows signs of inflammation and has completed 3-days of IVMP (5/28, 5/29 and 5/30).  Since starting steroids, he is able to function and thinking is better ("less foggy").  His mother says that he is less sedentary and is able to walk longer distances without getting fatigued as  easily.   - Follow-up 08/26/2013:  He has not returned to work because they do not have work based on his restrictions.   He complains of intermittent tightness of his arm and legs, especially with over exertion such as climbing stairs.     - Follow-up 10/27/2013:  He started copaxone on 9/2 and is tolerating it well.  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  He completed functional capacity assessment which indicated he can perform light work.  Mood is good.  His mom says that he sang in church on Sunday, which is new since the last time he did that was 57-month ago!  UPDATE 03/16/2014:  He is feeling and and is back to working ~32 hours per week.  He is more alert during the day and is only taking his provigil as needed.  He is cautious with what he eats and trying to cut carbs. He is staying active and walks on his treadmill about 257m several times per week.  Interval MRI brain and c-spine from 12/2013 shows no new lesions and resolution of enhancement.  He is very talkative today.  Mood is great.  UPDATE 09/15/2014:  No new complaints.  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  His energy level is up and he no longer has to take the provigil.  He is working 32-35 hours per week.  His annual eye exam  showed mild vision acuity changes and now uses reading glasses, otherwise healthy optic nerves.  He has many questions regarding nutrition, vitamins, and other supplements for various medical uses.   Medications:  Current Outpatient Prescriptions on File Prior to Visit  Medication Sig Dispense Refill  . amLODipine (NORVASC) 5 MG tablet Take 1 tablet (5 mg total) by mouth daily. 30 tablet 11  . COPAXONE 40 MG/ML SOSY INJECT 40 MG UNDER THE SKIN THREE TIMES A WEEK 36 mL 49  . fluocinonide cream (LIDEX) 0.96 % Apply 1 application topically 2 (two) times daily. 30 g 0  . irbesartan (AVAPRO) 150 MG tablet Take 1 tablet (150  mg total) by mouth daily. 30 tablet 11  . modafinil (PROVIGIL) 100 MG tablet Take 1 tablet (100 mg total) by mouth daily. (Patient taking differently: Take 100 mg by mouth as needed. ) 30 tablet 3  . omeprazole (PRILOSEC) 20 MG capsule 2 tabs by mouth per day 60 capsule 11   No current facility-administered medications on file prior to visit.    Allergies:  Allergies  Allergen Reactions  . Penicillins Itching and Rash    Little bumps     Review of Systems:  CONSTITUTIONAL: No fevers, chills, night sweats, or weight loss.   EYES: No visual changes or eye pain ENT: No hearing changes.  No history of nose bleeds.   RESPIRATORY: No cough, wheezing and shortness of breath.   CARDIOVASCULAR: Negative for chest pain, and palpitations.   GI: Negative for abdominal discomfort, blood in stools or black stools.  No recent change in bowel habits.   GU:  No history of incontinence.   MUSCLOSKELETAL: No history of joint pain or swelling.  No myalgias.   SKIN: Negative for lesions, + rash, and itching.   ENDOCRINE: Negative for cold or heat intolerance, polydipsia or goiter.   PSYCH: No depression or anxiety symptoms.   NEURO: As Above.   Vital Signs:  BP 110/70 mmHg  Pulse 60  Ht '5\' 7"'  (1.702 m)  Wt 178 lb 1 oz (80.769 kg)  BMI 27.88 kg/m2  SpO2 97%  Chest:  There is mild fullness of his left breast tissue as compared with the right.  No masses were palpated.  Neurological Exam: MENTAL STATUS including orientation to time, place, person, recent and remote memory, attention span and concentration, language, and fund of knowledge is normal.  Speech is not dysarthric.  CRANIAL NERVES:  Pupils equal round and reactive to light.  Normal conjugate, extra-ocular eye movements in all directions of gaze. Mild right ptosis.  Face is symmetric. Palate elevates symmetrically.  Tongue is midline.  MOTOR:  Motor strength is 5/5 in all extremities.  No tremor. No pronator drift.  Tone is normal.     MSRs:   Right      Left  brachioradialis  2+   brachioradialis  2+   biceps  2+   biceps  2+   triceps  2+   triceps  2+   patellar  2+   patellar  2+   ankle jerk  2+   ankle jerk  2+   Hoffman  no   Hoffman  no   plantar response  up  plantar response  down    SENSORY:  Intact to vibration and light touch throughout.  COORDINATION/GAIT:  Gait narrow based and stable. Stressed gait intact.    Data: MRI brain wwo contrast 12/25/2013: Multiple lesoins in the periventricular and juxtacortical white matter regions bilaterally.  There is no longer enhancement or diffusion abnormality. No new lesions MRI cervical spine wwo contrast: Normal  CT head 04/09/2013: Chronic microvascular changes.  MRI brain wwo contrast 05/28/2013:  Multiple lesions in the periventricular and juxtacortical white matter bilaterally, one of which enhances.  The pattern and distribution of these lesions suggest demyelinating disease such as multiple sclerosis.   MRI cervical spine wwo contrast 06/04/2013:  Normal MRI cervical spine   EMG right upper and lower extremity 05/14/2013: There is electrophysiological evidence of an intraspinal canal lesion (i.e. radiculopathy) affecting the L5 nerve root/segment bilaterally. Overall, these changes are mild in degree electrically and worse on the right side.   There is no evidence of a generalized sensorimotor polyneuropathy, cervical radiculopathy or diffuse myopathy.   Labs 03/27/2013:  TSH 0.73, B12 847, ESR 14, vitamin D 37 Labs 05/14/2013:  CK 26, aldolase 26, copper 91, MG panel negative  CSF 06/22/2013: R0 W0 G61 P27  ACE 4, Lyme neg, MBP 61, IgG 1.6 OCB The patient's CSF contains >5 well defined gamma restriction bands that are also present in the patient's  corresponding serum sample, but some bands in the CSF are more prominent.  CSF cytology with few mononuclear cells CSF cytometry was not performed due to insufficient sample.   IMPRESSION/PLAN: Mr. Kornegay is  a 44 year-old gentleman returning to the clinic with multiple sclerosis (diagnosed May 2015).  His manifesting symptoms included generalized fatigue and tremors. Imaging of his brain shows periventricular and juxtacortical white matter bilaterally, one of which enhances.  Cervical cord is normal.  CSF testing showed inflammatory changes with oligoclonal bands. Due to clinical suspicion of multiple sclerosis, he completed 3-days of IVMP (5/28-5/30) with significant improvement.  He started Copaxone on 10/07/2013.  Clinically, he is doing well.  No deficits on exam.  He is tolerating Copaxone well and has noticed mild thinning of the subcutaneous fat in the arms, which looks stable from this last visit.  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  He should continue taking Copaxone 73m three times weekly and vitamin D 4000U daily.  He had multiple questions regarding vitamins, minerals, and amino acids for his diet which I explained is out of my realm of expertise. I encouraged a low-fat, low carb diet and suggested adding yogurt to his diet as there is some research looking at the role of healthy gut bacteria in MS.   Repeat MRI brain wwo contrast in November for annual surveillance   Recommend f/u with his PCP regarding left breast/chest fullness  I will see him back in 644-month  The duration of this appointment visit was 25 minutes of face-to-face time with the patient.  Greater than 50% of this time was spent in counseling, explanation of diagnosis, planning of further management, and coordination of care.   Thank you for allowing me to participate in patient's care.  If I can answer any additional questions, I would be pleased to do so.    Sincerely,    [name] K. PaPosey ProntoDO

## 2014-09-15 NOTE — Patient Instructions (Addendum)
1.  We will send new prescription for Copaxone  2.  MRI brain wwo contrast in November 3.  Return to clinic in 9-months

## 2014-09-21 ENCOUNTER — Telehealth: Payer: Self-pay | Admitting: *Deleted

## 2014-09-21 NOTE — Telephone Encounter (Signed)
Copaxone approved from 08-22-14 until 09-21-15.

## 2014-09-23 IMAGING — RF DG FLUORO GUIDE LUMBAR PUNCTURE
1 series · 1 of 1 positions shown · non-contrast
Comparison: none

CLINICAL DATA: Demyelinating disease.

[Series 1: run · 1 of 1 slices shown]
[im 1/1]
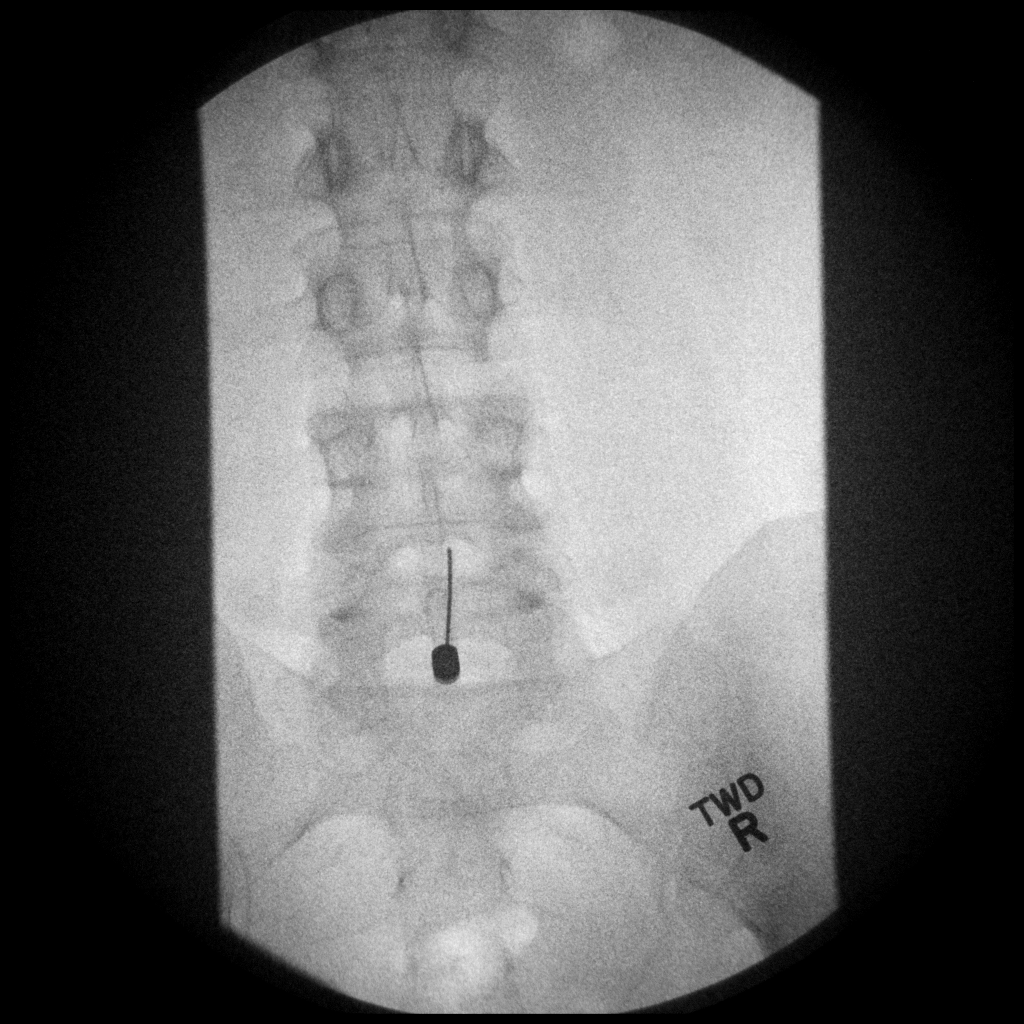

[1 of 1 positions shown; findings below may reference images not displayed]

EXAM:
DIAGNOSTIC LUMBAR PUNCTURE UNDER FLUOROSCOPIC GUIDANCE

FLUOROSCOPY TIME:  0 min 20 seconds

PROCEDURE:
Informed consent was obtained from the patient prior to the
procedure, including potential complications of headache, allergy,
and pain. With the patient prone, the lower back was prepped with
Betadine. 1% Lidocaine was used for local anesthesia. Lumbar
puncture was performed at the L4-5 level using a 21 gauge needle
with return of clear colorless CSF with an opening pressure of 12 cm
water. 28ml of CSF were obtained for laboratory studies. The patient
tolerated the procedure well and there were no apparent
complications.
IMPRESSION: Diagnostic lumbar puncture performed without complication.

## 2014-11-09 ENCOUNTER — Telehealth: Payer: Self-pay | Admitting: Neurology

## 2014-11-09 NOTE — Telephone Encounter (Signed)
Pt called and wanted to know if you could schedule his yearly MRI on November 10th between 9:30 and 10:00 at [facility] and it needs to be an open MRI and he needs some Valume called in to take the test, his follow up appointment with Dr Jonny Ruiz is on November 10th at 8:15 and wants to get it done while he is over here. He also said he will be at work and it's ok to leave a message on his phone/Dawn CB# [phone]

## 2014-11-10 ENCOUNTER — Other Ambulatory Visit: Payer: Self-pay | Admitting: *Deleted

## 2014-11-10 DIAGNOSIS — G35 Multiple sclerosis: Secondary | ICD-10-CM

## 2014-11-10 NOTE — Telephone Encounter (Signed)
Appointment for MRI is on Nov. 10 at 9:30 at [facility].  Left message informing patient.

## 2014-12-13 ENCOUNTER — Other Ambulatory Visit: Payer: Self-pay | Admitting: *Deleted

## 2014-12-13 ENCOUNTER — Telehealth: Payer: Self-pay | Admitting: Neurology

## 2014-12-13 MED ORDER — DIAZEPAM 5 MG PO TABS: 5.0000 mg | ORAL_TABLET | Freq: Once | ORAL | Status: DC

## 2014-12-13 NOTE — Telephone Encounter (Signed)
PT called to remind you to call in a prescription for valium before his MRI on Thursday/Dawn CB# [phone]

## 2014-12-13 NOTE — Telephone Encounter (Signed)
Rx sent 

## 2014-12-16 ENCOUNTER — Encounter: Payer: Self-pay | Admitting: Internal Medicine

## 2014-12-16 ENCOUNTER — Ambulatory Visit (INDEPENDENT_AMBULATORY_CARE_PROVIDER_SITE_OTHER): Payer: BLUE CROSS/BLUE SHIELD | Admitting: Internal Medicine

## 2014-12-16 VITALS — BP 142/80 | HR 59 | Temp 97.7°F | Resp 20 | Ht 67.0 in | Wt 181.5 lb

## 2014-12-16 DIAGNOSIS — J309 Allergic rhinitis, unspecified: Secondary | ICD-10-CM | POA: Insufficient documentation

## 2014-12-16 DIAGNOSIS — I10 Essential (primary) hypertension: Secondary | ICD-10-CM

## 2014-12-16 DIAGNOSIS — G35 Multiple sclerosis: Secondary | ICD-10-CM | POA: Diagnosis not present

## 2014-12-16 DIAGNOSIS — J019 Acute sinusitis, unspecified: Secondary | ICD-10-CM

## 2014-12-16 DIAGNOSIS — Z0189 Encounter for other specified special examinations: Secondary | ICD-10-CM

## 2014-12-16 DIAGNOSIS — Z0000 Encounter for general adult medical examination without abnormal findings: Secondary | ICD-10-CM

## 2014-12-16 NOTE — Assessment & Plan Note (Signed)
Some improved, pt reassured, ok to cont current tx.  to f/u any worsening symptoms or concerns

## 2014-12-16 NOTE — Progress Notes (Signed)
Subjective:    Patient ID: Russell Penner., male    DOB: 1970/09/23, 44 y.o.   MRN: [id number]  HPI    Here with 7 acute onset fever, facial pain, pressure, headache, general weakness and malaise, and greenish d/c, with mild ST and cough, but pt denies chest pain, wheezing, increased sob or doe, orthopnea, PND, increased LE swelling, palpitations, dizziness or syncope.  Was seen at [facility] 3 days ago, now on cefdinir, Does have several wks ongoing nasal allergy symptoms with clearish congestion, itch and sneezing, without fever, pain, ST, cough, swelling or wheezing. Also some better with antihist/nasal steroid, mucinex otc per pt also rec'd per UC.  Due for f/u yearly Head MRI later today.  Still working full time, though recognizes he may be having some minor cognitive issue? Has not been a problem yet. BP Readings from Last 3 Encounters:  12/16/14 142/80  09/15/14 110/70  06/15/14 142/64   Wt Readings from Last 3 Encounters:  12/16/14 181 lb 8 oz (82.328 kg)  09/15/14 178 lb 1 oz (80.769 kg)  06/15/14 178 lb (80.74 kg)   Past Medical History  Diagnosis Date  . BIPOLAR DISORDER UNSPECIFIED 04/18/2009    Qualifier: Diagnosis of  By: Jonny Ruiz MD, Len Blalock   . ASTHMA, CHILDHOOD 04/18/2009    Qualifier: Diagnosis of  By: Jonny Ruiz MD, Len Blalock   . Hypertension   . Insomnia   . Multiple sclerosis (HCC) 2015  . GERD (gastroesophageal reflux disease) 07/30/2013  . Psoriasis 12/15/2013  . Ventral hernia without obstruction or gangrene 12/15/2013   Past Surgical History  Procedure Laterality Date  . Hernia repair      reports that he has never smoked. He has never used smokeless tobacco. He reports that he does not drink alcohol or use illicit drugs. family history includes COPD in his sister; Diabetes in his father; Fibromyalgia in his sister; Healthy in his sister; Heart disease in his father; Hypertension in his father and mother; Stroke in his father. Allergies  Allergen Reactions  . Penicillins  Itching and Rash    Little bumps   Current Outpatient Prescriptions on File Prior to Visit  Medication Sig Dispense Refill  . amLODipine (NORVASC) 5 MG tablet Take 1 tablet (5 mg total) by mouth daily. 30 tablet 11  . COPAXONE 40 MG/ML SOSY INJECT 40 MG UNDER THE SKIN THREE TIMES A WEEK 36 mL 49  . diazepam (VALIUM) 5 MG tablet Take 1 tablet (5 mg total) by mouth once. Take 30 minutes prior to MRI. 1 tablet 0  . fluocinonide cream (LIDEX) 0.05 % Apply 1 application topically 2 (two) times daily. 30 g 0  . irbesartan (AVAPRO) 150 MG tablet Take 1 tablet (150 mg total) by mouth daily. 30 tablet 11  . modafinil (PROVIGIL) 100 MG tablet Take 1 tablet (100 mg total) by mouth daily. (Patient taking differently: Take 100 mg by mouth as needed. ) 30 tablet 3  . omeprazole (PRILOSEC) 20 MG capsule 2 tabs by mouth per day 60 capsule 11   No current facility-administered medications on file prior to visit.    Review of Systems  Constitutional: Negative for unusual diaphoresis or night sweats HENT: Negative for ringing in ear or discharge Eyes: Negative for double vision or worsening visual disturbance.  Respiratory: Negative for choking and stridor.   Gastrointestinal: Negative for vomiting or other signifcant bowel change Genitourinary: Negative for hematuria or change in urine volume.  Musculoskeletal: Negative for other MSK pain or  swelling Skin: Negative for color change and worsening wound.  Neurological: Negative for tremors and numbness other than noted  Psychiatric/Behavioral: Negative for decreased concentration or agitation other than above       Objective:   Physical Exam BP 142/80 mmHg  Pulse 59  Temp(Src) 97.7 F (36.5 C) (Oral)  Resp 20  Ht 5\' 7"  (1.702 m)  Wt 181 lb 8 oz (82.328 kg)  BMI 28.42 kg/m2  SpO2 98% VS noted,  Constitutional: Pt appears in no significant distress HENT: Head: NCAT.  Right Ear: External ear normal.  Left Ear: External ear normal.  Bilat tm's  with mild erythema.  Max sinus areas mild tender.  Pharynx with mild erythema, no exudate Eyes: . Pupils are equal, round, and reactive to light. Conjunctivae and EOM are normal Neck: Normal range of motion. Neck supple.  Cardiovascular: Normal rate and regular rhythm.   Pulmonary/Chest: Effort normal and breath sounds without rales or wheezing.  Neurological: Pt is alert. Not confused , motor grossly intact Skin: Skin is warm. No rash, no LE edema Psychiatric: Pt behavior is normal. No agitation.      Assessment & Plan:

## 2014-12-16 NOTE — Assessment & Plan Note (Signed)
Also for f/u MRI head today as planned, and fu neurology

## 2014-12-16 NOTE — Assessment & Plan Note (Signed)
.  Mild to mod, for antihist/nasal steroid combo tx to continue,  to f/u any worsening symptoms or concerns

## 2014-12-16 NOTE — Progress Notes (Signed)
Pre visit review using our clinic review tool, if applicable. No additional management support is needed unless otherwise documented below in the visit note. 

## 2014-12-16 NOTE — Assessment & Plan Note (Signed)
Mild elve today , likely reactive, cont to monitor at home and next visit,  to f/u any worsening symptoms or concerns

## 2014-12-16 NOTE — Patient Instructions (Signed)
Please continue all other medications as before, and refills have been done if requested.  Please have the pharmacy call with any other refills you may need.  Please continue your efforts at being more active, low cholesterol diet, and weight control.  You are otherwise up to date with prevention measures today.  Please keep your appointments with your specialists as you may have planned  Please return in 6 months, or sooner if needed, with Lab testing done 3-5 days before  

## 2014-12-17 ENCOUNTER — Telehealth: Payer: Self-pay | Admitting: Neurology

## 2014-12-17 NOTE — Telephone Encounter (Signed)
MRI brain wwo contrast 12/16/2014:   1.  Unchanged supratentorial white matter lesions.  No definite new lesions.  No abnormal enhancement or restricted diffusion. 2.  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 3.  Paransal sinus inflammatory change, correlate clinically.   Morrie Sheldon, please let Russell Moses know his MRI brain does not show any new lesions or findings.  There is some sinus changes seen and if he is having sinusitis, he may need to see his PCP for recommendations/treatment.   [name] K. Allena Katz, DO

## 2014-12-21 NOTE — Telephone Encounter (Signed)
Patient given results

## 2015-03-09 ENCOUNTER — Other Ambulatory Visit: Payer: Self-pay

## 2015-03-09 MED ORDER — AMLODIPINE BESYLATE 5 MG PO TABS: 5.0000 mg | ORAL_TABLET | Freq: Every day | ORAL | Status: DC

## 2015-03-09 MED ORDER — OMEPRAZOLE 20 MG PO CPDR: DELAYED_RELEASE_CAPSULE | ORAL | Status: DC

## 2015-03-09 MED ORDER — IRBESARTAN 150 MG PO TABS: 150.0000 mg | ORAL_TABLET | Freq: Every day | ORAL | Status: DC

## 2015-03-18 ENCOUNTER — Ambulatory Visit (INDEPENDENT_AMBULATORY_CARE_PROVIDER_SITE_OTHER): Payer: BLUE CROSS/BLUE SHIELD | Admitting: Neurology

## 2015-03-18 ENCOUNTER — Encounter: Payer: Self-pay | Admitting: Neurology

## 2015-03-18 VITALS — BP 128/70 | HR 58 | Wt 179.2 lb

## 2015-03-18 DIAGNOSIS — G35 Multiple sclerosis: Secondary | ICD-10-CM | POA: Diagnosis not present

## 2015-03-18 NOTE — Patient Instructions (Signed)
1.  Annual eye exam 2.  Encouraged to stay active and limit snacking  Return to clinic in October

## 2015-03-18 NOTE — Progress Notes (Signed)
Follow-up Visit   Date: 03/18/2015    Russell Moses. MRN: [id number] DOB: 20-Jun-1970   Interim History: Russell Moses. is a 45 y.o. right-handed Caucasian male with history of hypertension, bipolar disorder, asthma, and anxiety returning to the clinic for follow-up of multiple sclerosis.  The patient was accompanied to the clinic by self.  History of present illness: In 2014, he developed a tingling sensation over his head and get "swimming headed", which lasted a few seconds and spontaneously resolved. He felt that his blood pressure was going up so went to see. [name], his PCP. Starting in February 2015, he developed tremors of the hands and bilateral leg weakness. He feels that he gets fatigued easily. On March 5th 2015, he was at work and started shaking, lost his energy, felt as if his head was heavy, and was short of breath so called his mom who took him to [facility]. He has CT head and lab work was unrevealing. He was discharged once he felt better.  His MRI brain in April 2015 showed white matter changes concerning for demyelinating disease.  He continues to have worsening shakiness of the hands, episodic weakness, and fatigues quickly.  He has developed new agoraphobia and is scheduled to see a psychiatrist for this. Patient underwent CSF testing which was which shows signs of inflammation and has completed 3-days of IVMP (5/28, 5/29 and 5/30).  Since starting steroids, he is able to function and thinking is better ("less foggy").    - Follow-up 10/27/2013:  He started copaxone on 9/2 and is tolerating it well.  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  He completed functional capacity assessment which indicated he can perform light work.  Mood is good.  His mom says that he sang in church on Sunday, which is new since the last time he did that was 32-month ago!  UPDATE 03/16/2014:  He is feeling and and is back to  working ~32 hours per week.  He is more alert during the day and is only taking his provigil as needed.  He is cautious with what he eats and trying to cut carbs. He is staying active and walks on his treadmill about 214m several times per week.  Interval MRI brain and c-spine from 12/2013 shows no new lesions and resolution of enhancement.  He is very talkative today.  Mood is great.  UPDATE 09/15/2014:  No new complaints.  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  His energy level is up and he no longer has to take the provigil.  He is working 32-35 hours per week.  He has many questions regarding nutrition, vitamins, and other supplements for various medical uses.  UPDATE 03/18/2015:  He reports having a GI illness in December - January, but has not had any hospitalizations.  Surveillance MRI brain from November 2016 did not show any new lesions.  No new complaints.  He is trying to loose weight but admits to have a liking for carbohydrates and sweets.  He has been tolerating Copaxone and also reports improvement of his psoriasis.   Medications:  Current Outpatient Prescriptions on File Prior to Visit  Medication Sig Dispense Refill  . amLODipine (NORVASC) 5 MG tablet Take 1 tablet (5 mg total) by mouth daily. 30 tablet 11  . cefdinir (OMNICEF) 300 MG capsule Take 300 mg by mouth 2 (two) times  daily.    Marland Kitchen COPAXONE 40 MG/ML SOSY INJECT 40 MG UNDER THE SKIN THREE TIMES A WEEK 36 mL 49  . diazepam (VALIUM) 5 MG tablet Take 1 tablet (5 mg total) by mouth once. Take 30 minutes prior to MRI. 1 tablet 0  . fluocinonide cream (LIDEX) 2.72 % Apply 1 application topically 2 (two) times daily. 30 g 0  . irbesartan (AVAPRO) 150 MG tablet Take 1 tablet (150 mg total) by mouth daily. 30 tablet 11  . modafinil (PROVIGIL) 100 MG tablet Take 1 tablet (100 mg total) by mouth daily. (Patient taking differently: Take 100 mg by mouth as needed. ) 30 tablet 3  . omeprazole (PRILOSEC) 20 MG  capsule 2 tabs by mouth per day 60 capsule 11   No current facility-administered medications on file prior to visit.    Allergies:  Allergies  Allergen Reactions  . Penicillins Itching and Rash    Little bumps     Review of Systems:  CONSTITUTIONAL: No fevers, chills, night sweats, or weight loss.   EYES: No visual changes or eye pain ENT: No hearing changes.  No history of nose bleeds.   RESPIRATORY: No cough, wheezing and shortness of breath.   CARDIOVASCULAR: Negative for chest pain, and palpitations.   GI: Negative for abdominal discomfort, blood in stools or black stools.  No recent change in bowel habits.   GU:  No history of incontinence.   MUSCLOSKELETAL: No history of joint pain or swelling.  No myalgias.   SKIN: Negative for lesions, + rash, and itching.   ENDOCRINE: Negative for cold or heat intolerance, polydipsia or goiter.   PSYCH: No depression or anxiety symptoms.   NEURO: As Above.   Vital Signs:  BP 128/70 mmHg  Pulse 58  Wt 179 lb 3 oz (81.279 kg)  SpO2 97%  Neurological Exam: MENTAL STATUS including orientation to time, place, person, recent and remote memory, attention span and concentration, language, and fund of knowledge is normal.  Speech is not dysarthric.  CRANIAL NERVES:  Pupils equal round and reactive to light.  Normal conjugate, extra-ocular eye movements in all directions of gaze. Mild right ptosis.  Face is symmetric. Palate elevates symmetrically.  Tongue is midline.  MOTOR:  Motor strength is 5/5 in all extremities.  No tremor. No pronator drift.  Tone is normal.    MSRs:   Right      Left  brachioradialis  2+   brachioradialis  2+   biceps  2+   biceps  2+   triceps  2+   triceps  2+   patellar  2+   patellar  2+   ankle jerk  2+   ankle jerk  2+    SENSORY:  Intact to vibration and light touch throughout.  COORDINATION/GAIT:  Gait narrow based and stable. Stressed and tandem gait intact.    Data: MRI brain wwo contrast  12/16/2014: 1. Unchanged supratentorial white matter lesions. No definite new lesions. No abnormal enhancement or restricted diffusion. 2. 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 3. Paransal sinus inflammatory change, correlate clinically.  MRI brain wwo contrast 12/25/2013: Multiple lesoins in the periventricular and juxtacortical white matter regions bilaterally. There is no longer enhancement or diffusion abnormality. No new lesions  MRI cervical spine wwo contrast: Normal  CT head 04/09/2013: Chronic microvascular changes.  MRI brain wwo contrast 05/28/2013:  Multiple lesions in the periventricular and juxtacortical  white matter bilaterally, one of which enhances.  The pattern and distribution of these lesions suggest demyelinating disease such as multiple sclerosis.   MRI cervical spine wwo contrast 06/04/2013:  Normal MRI cervical spine   EMG right upper and lower extremity 05/14/2013: There is electrophysiological evidence of an intraspinal canal lesion (i.e. radiculopathy) affecting the L5 nerve root/segment bilaterally. Overall, these changes are mild in degree electrically and worse on the right side.   There is no evidence of a generalized sensorimotor polyneuropathy, cervical radiculopathy or diffuse myopathy.   Labs 03/27/2013:  TSH 0.73, B12 847, ESR 14, vitamin D 37 Labs 05/14/2013:  CK 26, aldolase 26, copper 91, MG panel negative  CSF 06/22/2013: R0 W0 G61 P27  ACE 4, Lyme neg, MBP 61, IgG 1.6 OCB The patient's CSF contains >5 well defined gamma restriction bands that are also present in the patient's  corresponding serum sample, but some bands in the CSF are more prominent.  CSF cytology with few mononuclear cells CSF cytometry was not performed due to insufficient sample.   IMPRESSION/PLAN: Relapsing-remitting multiple sclerosis, diagnosed May 2015  - Presenting symptoms:  generalized  fatigue and tremors, completed 3-day course of IVMP in May 2015 with marked improvement  - Imaging of his brain shows periventricular and juxtacortical white matter bilaterally; normal cervical cord.   - He started Copaxone on 10/07/2013  Last surveillance imaging in Nov 2016 was reviewed and does not show evidence of disease progression Clinically, he is doing well.  No deficits on exam.  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 Continue Copaxone 90m three times weekly and vitamin D 4000U daily. Repeat MRI brain wwo contrast in November for annual surveillance  Encouraged to stay active and limiting snacking  I will see him back in 99-month  The duration of this appointment visit was 30 minutes of face-to-face time with the patient.  Greater than 50% of this time was spent in counseling, explanation of diagnosis, planning of further management, and coordination of care.   Thank you for allowing me to participate in patient's care.  If I can answer any additional questions, I would be pleased to do so.    Sincerely,    Alexina Niccoli K. PaPosey ProntoDO

## 2015-06-08 ENCOUNTER — Other Ambulatory Visit (INDEPENDENT_AMBULATORY_CARE_PROVIDER_SITE_OTHER): Payer: BLUE CROSS/BLUE SHIELD

## 2015-06-08 DIAGNOSIS — Z0189 Encounter for other specified special examinations: Secondary | ICD-10-CM

## 2015-06-08 DIAGNOSIS — Z0000 Encounter for general adult medical examination without abnormal findings: Secondary | ICD-10-CM

## 2015-06-08 LAB — HEPATIC FUNCTION PANEL
ALBUMIN: 4.1 g/dL (ref 3.5–5.2)
ALT: 34 U/L (ref 0–53)
AST: 19 U/L (ref 0–37)
Alkaline Phosphatase: 96 U/L (ref 39–117)
BILIRUBIN DIRECT: 0.1 mg/dL (ref 0.0–0.3)
TOTAL PROTEIN: 6.7 g/dL (ref 6.0–8.3)
Total Bilirubin: 0.5 mg/dL (ref 0.2–1.2)

## 2015-06-08 LAB — CBC WITH DIFFERENTIAL/PLATELET
BASOS ABS: 0 10*3/uL (ref 0.0–0.1)
Basophils Relative: 0.5 % (ref 0.0–3.0)
EOS PCT: 1.9 % (ref 0.0–5.0)
Eosinophils Absolute: 0.1 10*3/uL (ref 0.0–0.7)
HEMATOCRIT: 43.6 % (ref 39.0–52.0)
Hemoglobin: 14.9 g/dL (ref 13.0–17.0)
LYMPHS PCT: 28.6 % (ref 12.0–46.0)
Lymphs Abs: 1.6 10*3/uL (ref 0.7–4.0)
MCHC: 34.3 g/dL (ref 30.0–36.0)
MCV: 89.3 fl (ref 78.0–100.0)
MONOS PCT: 10.5 % (ref 3.0–12.0)
Monocytes Absolute: 0.6 10*3/uL (ref 0.1–1.0)
Neutro Abs: 3.3 10*3/uL (ref 1.4–7.7)
Neutrophils Relative %: 58.5 % (ref 43.0–77.0)
Platelets: 191 10*3/uL (ref 150.0–400.0)
RBC: 4.88 Mil/uL (ref 4.22–5.81)
RDW: 13.4 % (ref 11.5–15.5)
WBC: 5.7 10*3/uL (ref 4.0–10.5)

## 2015-06-08 LAB — PSA: PSA: 0.53 ng/mL (ref 0.10–4.00)

## 2015-06-08 LAB — TSH: TSH: 1.89 u[IU]/mL (ref 0.35–4.50)

## 2015-06-08 LAB — BASIC METABOLIC PANEL
BUN: 14 mg/dL (ref 6–23)
CALCIUM: 10.1 mg/dL (ref 8.4–10.5)
CO2: 28 meq/L (ref 19–32)
CREATININE: 0.89 mg/dL (ref 0.40–1.50)
Chloride: 104 mEq/L (ref 96–112)
GFR: 97.78 mL/min (ref >60.00)
GLUCOSE: 101 mg/dL — AB (ref 70–99)
Potassium: 4.7 mEq/L (ref 3.5–5.1)
SODIUM: 138 meq/L (ref 135–145)

## 2015-06-08 LAB — URINALYSIS, ROUTINE W REFLEX MICROSCOPIC
BILIRUBIN URINE: NEGATIVE
Hgb urine dipstick: NEGATIVE
KETONES UR: NEGATIVE
Leukocytes, UA: NEGATIVE
NITRITE: NEGATIVE
PH: 6 (ref 5.0–8.0)
RBC / HPF: NONE SEEN (ref 0–?)
Total Protein, Urine: NEGATIVE
Urine Glucose: NEGATIVE
Urobilinogen, UA: 0.2 (ref 0.0–1.0)

## 2015-06-08 LAB — LIPID PANEL
Cholesterol: 174 mg/dL (ref 0–200)
HDL: 40.3 mg/dL (ref >39.00)
LDL CALC: 110 mg/dL — AB (ref 0–99)
NONHDL: 133.55
Total CHOL/HDL Ratio: 4
Triglycerides: 118 mg/dL (ref 0.0–149.0)
VLDL: 23.6 mg/dL (ref 0.0–40.0)

## 2015-06-10 ENCOUNTER — Encounter: Payer: Self-pay | Admitting: Internal Medicine

## 2015-06-15 ENCOUNTER — Encounter: Payer: Self-pay | Admitting: Internal Medicine

## 2015-06-15 ENCOUNTER — Ambulatory Visit (INDEPENDENT_AMBULATORY_CARE_PROVIDER_SITE_OTHER): Payer: BLUE CROSS/BLUE SHIELD | Admitting: Internal Medicine

## 2015-06-15 VITALS — BP 138/84 | HR 52 | Temp 98.4°F | Resp 20 | Wt 185.0 lb

## 2015-06-15 DIAGNOSIS — N529 Male erectile dysfunction, unspecified: Secondary | ICD-10-CM | POA: Diagnosis not present

## 2015-06-15 DIAGNOSIS — R6889 Other general symptoms and signs: Secondary | ICD-10-CM

## 2015-06-15 DIAGNOSIS — K439 Ventral hernia without obstruction or gangrene: Secondary | ICD-10-CM | POA: Diagnosis not present

## 2015-06-15 DIAGNOSIS — Z0001 Encounter for general adult medical examination with abnormal findings: Secondary | ICD-10-CM

## 2015-06-15 DIAGNOSIS — E785 Hyperlipidemia, unspecified: Secondary | ICD-10-CM

## 2015-06-15 DIAGNOSIS — L409 Psoriasis, unspecified: Secondary | ICD-10-CM

## 2015-06-15 DIAGNOSIS — E669 Obesity, unspecified: Secondary | ICD-10-CM | POA: Insufficient documentation

## 2015-06-15 DIAGNOSIS — I10 Essential (primary) hypertension: Secondary | ICD-10-CM | POA: Diagnosis not present

## 2015-06-15 MED ORDER — IRBESARTAN 150 MG PO TABS: 150.0000 mg | ORAL_TABLET | Freq: Every day | ORAL | Status: DC

## 2015-06-15 MED ORDER — SILDENAFIL CITRATE 20 MG PO TABS: ORAL_TABLET | ORAL | Status: DC

## 2015-06-15 MED ORDER — AMLODIPINE BESYLATE 5 MG PO TABS: 5.0000 mg | ORAL_TABLET | Freq: Every day | ORAL | Status: DC

## 2015-06-15 MED ORDER — OMEPRAZOLE 20 MG PO CPDR: DELAYED_RELEASE_CAPSULE | ORAL | Status: DC

## 2015-06-15 NOTE — Assessment & Plan Note (Signed)
Lab Results  Component Value Date   LDLCALC 110* 06/08/2015   Mild elev overall, for lower chol diet, o/w stable overall by history and exam, recent data reviewed with pt, and pt to continue medical treatment as before,  to f/u any worsening symptoms or concerns

## 2015-06-15 NOTE — Assessment & Plan Note (Signed)
stable overall by history and exam, recent data reviewed with pt, and pt to continue medical treatment as before,  to f/u any worsening symptoms or concerns BP Readings from Last 3 Encounters:  06/15/15 138/84  03/18/15 128/70  12/16/14 142/80

## 2015-06-15 NOTE — Assessment & Plan Note (Signed)
Mild enlarging per pt, o/w asympt, NT, no need for imaging or surgical eval at this time

## 2015-06-15 NOTE — Assessment & Plan Note (Addendum)
Mild to mod, for revatio prn,  to f/u any worsening symptoms or concerns  In addition to the time spent performing CPE, I spent an additional 40 minutes face to face,in which greater than 50% of this time was spent in counseling and coordination of care for patient's acute illness as documented.

## 2015-06-15 NOTE — Assessment & Plan Note (Signed)
Encouraged to be more active, less calories, goal wt at lesat < 170

## 2015-06-15 NOTE — Assessment & Plan Note (Signed)

## 2015-06-15 NOTE — Patient Instructions (Signed)
Please take all new medication as prescribed - the generic viagra if needed  Please continue all other medications as before, and refills have been done if requested.  Please have the pharmacy call with any other refills you may need.  Please continue your efforts at being more active, low cholesterol diet, and weight control.  You are otherwise up to date with prevention measures today.  Please keep your appointments with your specialists as you may have planned  Your BLood work was OK today  Please return in 6 months, or sooner if needed

## 2015-06-15 NOTE — Progress Notes (Signed)
Pre visit review using our clinic review tool, if applicable. No additional management support is needed unless otherwise documented below in the visit note. 

## 2015-06-15 NOTE — Progress Notes (Signed)
Subjective:    Patient ID: Russell Penner., male    DOB: 1971-01-12, 45 y.o.   MRN: [id number]  HPI  Here for wellness and f/u;  Overall doing ok;  Pt denies Chest pain, worsening SOB, DOE, wheezing, orthopnea, PND, worsening LE edema, palpitations, dizziness or syncope.  Pt denies neurological change such as new headache, facial or extremity weakness.  Pt denies polydipsia, polyuria, or low sugar symptoms. Pt states overall good compliance with treatment and medications, good tolerability, and has been trying to follow appropriate diet.  Pt denies worsening depressive symptoms, suicidal ideation or panic. No fever, night sweats, wt loss, loss of appetite, or other constitutional symptoms.  Pt states good ability with ADL's, has low fall risk, home safety reviewed and adequate, no other significant changes in hearing or vision, and only occasionally active with exercise. Denies worsening reflux, abd pain, dysphagia, n/v, bowel change or blood, but when eats or drinks food just seems to sit in the middle. Declines tetanus today  Wt now elevated more, admits to increased calories, less active recently, eats eggs every day, not trying so much to follow lower chol diet Wt Readings from Last 3 Encounters:  06/15/15 185 lb (83.915 kg)  03/18/15 179 lb 3 oz (81.279 kg)  12/16/14 181 lb 8 oz (82.328 kg)  Does c/o enlargning ventral hernia to the midline abd but still no pain.  Also with worsening ED symptoms over the past 6 montns, without increased stressors or med changes.  Just cant seem to maintain effectively.  incidnetly , psoriasis to hands and elbow much improved recently, without lidex use. Past Medical History  Diagnosis Date  . BIPOLAR DISORDER UNSPECIFIED 04/18/2009    Qualifier: Diagnosis of  By: Jonny Ruiz MD, Len Blalock   . ASTHMA, CHILDHOOD 04/18/2009    Qualifier: Diagnosis of  By: Jonny Ruiz MD, Len Blalock   . Hypertension   . Insomnia   . Multiple sclerosis (HCC) 2015  . GERD (gastroesophageal reflux  disease) 07/30/2013  . Psoriasis 12/15/2013  . Ventral hernia without obstruction or gangrene 12/15/2013   Past Surgical History  Procedure Laterality Date  . Hernia repair      reports that he has never smoked. He has never used smokeless tobacco. He reports that he does not drink alcohol or use illicit drugs. family history includes COPD in his sister; Diabetes in his father; Fibromyalgia in his sister; Healthy in his sister; Heart disease in his father; Hypertension in his father and mother; Stroke in his father. Allergies  Allergen Reactions  . Penicillins Itching and Rash    Little bumps   Current Outpatient Prescriptions on File Prior to Visit  Medication Sig Dispense Refill  . COPAXONE 40 MG/ML SOSY INJECT 40 MG UNDER THE SKIN THREE TIMES A WEEK 36 mL 49  . fluocinonide cream (LIDEX) 0.05 % Apply 1 application topically 2 (two) times daily. 30 g 0  . modafinil (PROVIGIL) 100 MG tablet Take 1 tablet (100 mg total) by mouth daily. (Patient taking differently: Take 100 mg by mouth as needed. ) 30 tablet 3   No current facility-administered medications on file prior to visit.    Review of Systems Constitutional: Negative for increased diaphoresis, or other activity, appetite or siginficant weight change other than noted HENT: Negative for worsening hearing loss, ear pain, facial swelling, mouth sores and neck stiffness.   Eyes: Negative for other worsening pain, redness or visual disturbance.  Respiratory: Negative for choking or stridor Cardiovascular: Negative for other  chest pain and palpitations.  Gastrointestinal: Negative for worsening diarrhea, blood in stool, or abdominal distention Genitourinary: Negative for hematuria, flank pain or change in urine volume.  Musculoskeletal: Negative for myalgias or other joint complaints.  Skin: Negative for other color change and wound or drainage.  Neurological: Negative for syncope and numbness. other than noted Hematological:  Negative for adenopathy. or other swelling Psychiatric/Behavioral: Negative for hallucinations, SI, self-injury, decreased concentration or other worsening agitation.      Objective:   Physical Exam BP 138/84 mmHg  Pulse 52  Temp(Src) 98.4 F (36.9 C) (Oral)  Resp 20  Wt 185 lb (83.915 kg)  SpO2 96% VS noted,  Constitutional: Pt is oriented to person, place, and time. Appears well-developed and well-nourished, in no significant distress Head: Normocephalic and atraumatic  Eyes: Conjunctivae and EOM are normal. Pupils are equal, round, and reactive to light Right Ear: External ear normal.  Left Ear: External ear normal Nose: Nose normal.  Mouth/Throat: Oropharynx is clear and moist  Neck: Normal range of motion. Neck supple. No JVD present. No tracheal deviation present or significant neck LA or mass Cardiovascular: Normal rate, regular rhythm, normal heart sounds and intact distal pulses.   Pulmonary/Chest: Effort normal and breath sounds without rales or wheezing  Abdominal: Soft. Bowel sounds are normal. NT. No HSM  + ventrl hernia nontender, reducible about 4 cm in the midline Musculoskeletal: Normal range of motion. Exhibits no edema Lymphadenopathy: Has no cervical adenopathy.  Neurological: Pt is alert and oriented to person, place, and time. Pt has normal reflexes. No cranial nerve deficit. Motor grossly intact Skin: Skin is warm and dry. No rash noted or new ulcers Psychiatric:  Has nervous mood and affect. Behavior is normal.     Assessment & Plan:

## 2015-06-15 NOTE — Assessment & Plan Note (Signed)
Improved spontaneouly it seems,  to f/u any worsening symptoms or concerns

## 2015-07-10 ENCOUNTER — Other Ambulatory Visit: Payer: Self-pay | Admitting: Neurology

## 2015-07-11 ENCOUNTER — Other Ambulatory Visit: Payer: Self-pay | Admitting: *Deleted

## 2015-07-11 MED ORDER — GLATIRAMER ACETATE 40 MG/ML ~~LOC~~ SOSY: PREFILLED_SYRINGE | SUBCUTANEOUS | Status: DC

## 2015-07-11 NOTE — Telephone Encounter (Signed)
Rx sent 

## 2015-07-14 ENCOUNTER — Other Ambulatory Visit: Payer: Self-pay | Admitting: Neurology

## 2015-07-14 NOTE — Telephone Encounter (Signed)
Rx sent 

## 2015-09-28 ENCOUNTER — Telehealth: Payer: Self-pay | Admitting: Neurology

## 2015-09-28 NOTE — Telephone Encounter (Signed)
PT called and had a question about a medication/Dawn CB# [phone]

## 2015-09-29 NOTE — Telephone Encounter (Signed)
Left message for patient to call me back. 

## 2015-09-29 NOTE — Telephone Encounter (Signed)
PT left a message for a call back with a medication question/Dawn CB# [phone]

## 2015-10-24 ENCOUNTER — Encounter: Payer: Self-pay | Admitting: Neurology

## 2015-10-25 ENCOUNTER — Other Ambulatory Visit: Payer: Self-pay | Admitting: *Deleted

## 2015-10-25 DIAGNOSIS — G35 Multiple sclerosis: Secondary | ICD-10-CM

## 2015-10-26 ENCOUNTER — Encounter: Payer: Self-pay | Admitting: *Deleted

## 2015-10-26 ENCOUNTER — Other Ambulatory Visit: Payer: Self-pay | Admitting: *Deleted

## 2015-10-26 MED ORDER — DIAZEPAM 5 MG PO TABS: 5.0000 mg | ORAL_TABLET | Freq: Once | ORAL | 0 refills | Status: AC

## 2015-11-15 NOTE — Progress Notes (Signed)
Follow-up Visit   Date: 11/16/15    Russell Moses. MRN: [id number] DOB: 1970-02-16   Interim History: Russell Clemens Lachman. is a 45 y.o. right-handed Caucasian male with history of hypertension, bipolar disorder, asthma, and anxiety returning to the clinic for follow-up of multiple sclerosis.  The patient was accompanied to the clinic by self.  History of present illness: In 2014, he developed a tingling sensation over his head and get "swimming headed", which lasted a few seconds and spontaneously resolved. He felt that his blood pressure was going up so went to see. [name], his PCP. Starting in February 2015, he developed tremors of the hands and bilateral leg weakness. He feels that he gets fatigued easily. On March 5th 2015, he was at work and started shaking, lost his energy, felt as if his head was heavy, and was short of breath so called his mom who took him to [facility]. He has CT head and lab work was unrevealing. He was discharged once he felt better.   His MRI brain in April 2015 showed white matter changes concerning for demyelinating disease.  He continues to have worsening shakiness of the hands, episodic weakness, and fatigues quickly.  He has developed new agoraphobia and is scheduled to see a psychiatrist for this. Patient underwent CSF testing which was which shows signs of inflammation and has completed 3-days of IVMP (5/28, 5/29 and 5/30).  Since starting steroids, he is able to function and thinking is better ("less foggy").  He started copaxone on 10/2013 and has been tolerating it well, besides injection site lipodystrophy.  Annual surveillance imaging has shown stable disease burden and he has not had any relapses since initial diagnosis in Spring 2015.  UPDATE 11/16/2015:  He has been tolerating Copaxone and also reports improvement of his psoriasis.  Unfortunately, his last co-pay for the medication was >$1000, whereas previously he was paying $100.  He has no new  neurological complaints.   He does not takes vitamin D due to GI upset.  He has started taking many supplements which he manages himself.     Medications:  Current Outpatient Prescriptions on File Prior to Visit  Medication Sig Dispense Refill  . amLODipine (NORVASC) 5 MG tablet Take 1 tablet (5 mg total) by mouth daily. 30 tablet 11  . COPAXONE 40 MG/ML SOSY INJECT 40 MG UNDER THE SKIN THREE TIMES A WEEK 36 mL 48  . irbesartan (AVAPRO) 150 MG tablet Take 1 tablet (150 mg total) by mouth daily. 30 tablet 11   No current facility-administered medications on file prior to visit.     Allergies:  Allergies  Allergen Reactions  . Penicillins Itching and Rash    Little bumps     Review of Systems:  CONSTITUTIONAL: No fevers, chills, night sweats, or weight loss.   EYES: No visual changes or eye pain ENT: No hearing changes.  No history of nose bleeds.   RESPIRATORY: No cough, wheezing and shortness of breath.   CARDIOVASCULAR: Negative for chest pain, and palpitations.   GI: Negative for abdominal discomfort, blood in stools or black stools.  No recent change in bowel habits.   GU:  No history of incontinence.   MUSCLOSKELETAL: No history of joint pain or swelling.  No myalgias.   SKIN: Negative for lesions, + rash, and itching.   ENDOCRINE: Negative for cold or heat intolerance, polydipsia or goiter.   PSYCH: No depression or anxiety symptoms.   NEURO: As Above.  Vital Signs:  BP 98/70   Pulse 60   Ht 5' 7.5" (1.715 m)   Wt 188 lb (85.3 kg)   BMI 29.01 kg/m   Neurological Exam: MENTAL STATUS including orientation to time, place, person, recent and remote memory, attention span and concentration, language, and fund of knowledge is normal.  Speech is not dysarthric.  CRANIAL NERVES:  Pupils equal round and reactive to light.  Normal conjugate, extra-ocular eye movements in all directions of gaze. No ptosis.  Face is symmetric. Palate elevates symmetrically.  Tongue is  midline.  MOTOR:  Motor strength is 5/5 in all extremities.  No tremor. No pronator drift.  Tone is normal.    MSRs:   Right      Left  brachioradialis  2+   brachioradialis  2+   biceps  2+   biceps  2+   triceps  2+   triceps  2+   patellar  2+   patellar  2+   ankle jerk  2+   ankle jerk  2+    SENSORY:  Intact to vibration and light touch throughout.  COORDINATION/GAIT:  Gait narrow based and stable.   Data: MRI brain wwo contrast 12/16/2014: 1. Unchanged supratentorial white matter lesions. No definite new lesions. No abnormal enhancement or restricted diffusion. 2. 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 3. Paransal sinus inflammatory change, correlate clinically.  MRI brain wwo contrast 12/25/2013: Multiple lesoins in the periventricular and juxtacortical white matter regions bilaterally. There is no longer enhancement or diffusion abnormality. No new lesions MRI cervical spine wwo contrast: Normal  MRI brain wwo contrast 05/28/2013:  Multiple lesions in the periventricular and juxtacortical white matter bilaterally, one of which enhances.  The pattern and distribution of these lesions suggest demyelinating disease such as multiple sclerosis.  MRI cervical spine wwo contrast 06/04/2013:  Normal MRI cervical spine   EMG right upper and lower extremity 05/14/2013:  There is electrophysiological evidence of an intraspinal canal lesion (i.e. radiculopathy) affecting the L5 nerve root/segment bilaterally. Overall, these changes are mild in degree electrically and worse on the right side. There is no evidence of a generalized sensorimotor polyneuropathy, cervical radiculopathy or diffuse myopathy.   Labs 03/27/2013:  TSH 0.73, B12 847, ESR 14, vitamin D 37 Labs 05/14/2013:  CK 26, aldolase 26, copper 91, MG panel negative  CSF 06/22/2013: R0 W0 G61 P27  ACE 4, Lyme neg, MBP 61, IgG 1.6 OCB The patient's  CSF contains >5 well defined gamma restriction bands that are also present in the patient's  corresponding serum sample, but some bands in the CSF are more prominent.  CSF cytology with few mononuclear cells CSF cytometry was not performed due to insufficient sample.   IMPRESSION/PLAN: Relapsing-remitting multiple sclerosis, diagnosed May 2015  - Presenting symptoms:  generalized fatigue and tremors, completed 3-day course of IVMP in May 2015 with marked improvement  - Imaging of his brain shows periventricular and juxtacortical white matter bilaterally; normal cervical cord.   - He started Copaxone on 10/2013 and has been stable without any relapses  He is due for annual surveillance imaging with MRI brain wwo contrast.  His MRI brain was in Nov 2016 which did not show evidence of disease progression Check CMP  Clinically, he is doing well.  No deficits on exam.  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 Continue  Copaxone 79m three times weekly.  His co-pay is too expensive (>$1000, previously paying $100) and I have provided him with [employer] contact number for co-pay assistance.  He also states that he does not have enough medication for the last 1-2 weeks of the year and will have to pay out-of-pocket for this, so we will try to reach out to see what assistance is available to him.  If the medication is going to be this expensive, we discussed switched to an oral alternative. Encouraged him to take vitamin D 4000U daily or eat foods rich in vitamin D   I will see him back in 933-month  The duration of this appointment visit was 30 minutes of face-to-face time with the patient.  Greater than 50% of this time was spent in counseling, explanation of diagnosis, planning of further management, and coordination of care.   Thank you for allowing me to participate in patient's care.  If I can answer any additional  questions, I would be pleased to do so.    Sincerely,    Lafe Clerk K. PaPosey ProntoDO

## 2015-11-16 ENCOUNTER — Ambulatory Visit (INDEPENDENT_AMBULATORY_CARE_PROVIDER_SITE_OTHER): Payer: BLUE CROSS/BLUE SHIELD | Admitting: Neurology

## 2015-11-16 ENCOUNTER — Encounter: Payer: Self-pay | Admitting: Neurology

## 2015-11-16 ENCOUNTER — Other Ambulatory Visit (INDEPENDENT_AMBULATORY_CARE_PROVIDER_SITE_OTHER): Payer: BLUE CROSS/BLUE SHIELD

## 2015-11-16 VITALS — BP 98/70 | HR 60 | Ht 67.5 in | Wt 188.0 lb

## 2015-11-16 DIAGNOSIS — G35 Multiple sclerosis: Secondary | ICD-10-CM

## 2015-11-16 LAB — COMPREHENSIVE METABOLIC PANEL
ALBUMIN: 4 g/dL (ref 3.5–5.2)
ALT: 45 U/L (ref 0–53)
AST: 25 U/L (ref 0–37)
Alkaline Phosphatase: 130 U/L — ABNORMAL HIGH (ref 39–117)
BILIRUBIN TOTAL: 0.3 mg/dL (ref 0.2–1.2)
BUN: 15 mg/dL (ref 6–23)
CALCIUM: 9.8 mg/dL (ref 8.4–10.5)
CO2: 24 mEq/L (ref 19–32)
CREATININE: 0.86 mg/dL (ref 0.40–1.50)
Chloride: 107 mEq/L (ref 96–112)
GFR: 101.53 mL/min (ref >60.00)
Glucose, Bld: 128 mg/dL — ABNORMAL HIGH (ref 70–99)
Potassium: 4.5 mEq/L (ref 3.5–5.1)
Sodium: 138 mEq/L (ref 135–145)
TOTAL PROTEIN: 7.1 g/dL (ref 6.0–8.3)

## 2015-11-16 NOTE — Patient Instructions (Addendum)
1.  Check blood work 2.  MRI brain wwo contrast 3.  Call Copaxone co-pay assistance/[employer] program at [phone] 4.  If you do not hear anything from my office in 2 weeks regarding additional Copaxone to get you through the end of the year, call my office   Return to clinic in 9 months

## 2015-11-18 ENCOUNTER — Telehealth: Payer: Self-pay | Admitting: Neurology

## 2015-11-18 NOTE — Telephone Encounter (Signed)
Patient made aware labs are stable per Dr. Allena KatzPatel.

## 2015-11-18 NOTE — Telephone Encounter (Signed)
Russell Moses 2070/12/07. He is a patient of Dr. Eliane Decree. His # is  [phone]. He has a question regarding his blood work. Thank you

## 2015-11-18 NOTE — Telephone Encounter (Signed)
Left message on machine for patient to call back.

## 2015-11-22 NOTE — Progress Notes (Signed)
Called patient and left message for him to come by and pick up samples.  Also called [employer] ([employer] rep) and requested more samples and a call back to discuss patient's copay.

## 2015-12-16 ENCOUNTER — Encounter: Payer: Self-pay | Admitting: Internal Medicine

## 2015-12-16 ENCOUNTER — Ambulatory Visit (INDEPENDENT_AMBULATORY_CARE_PROVIDER_SITE_OTHER): Payer: BLUE CROSS/BLUE SHIELD | Admitting: Internal Medicine

## 2015-12-16 ENCOUNTER — Other Ambulatory Visit: Payer: BLUE CROSS/BLUE SHIELD

## 2015-12-16 VITALS — BP 132/70 | HR 60 | Temp 98.7°F | Resp 20 | Wt 190.0 lb

## 2015-12-16 DIAGNOSIS — R739 Hyperglycemia, unspecified: Secondary | ICD-10-CM | POA: Diagnosis not present

## 2015-12-16 DIAGNOSIS — E785 Hyperlipidemia, unspecified: Secondary | ICD-10-CM | POA: Diagnosis not present

## 2015-12-16 DIAGNOSIS — I10 Essential (primary) hypertension: Secondary | ICD-10-CM

## 2015-12-16 DIAGNOSIS — Z0001 Encounter for general adult medical examination with abnormal findings: Secondary | ICD-10-CM | POA: Diagnosis not present

## 2015-12-16 MED ORDER — ASPIRIN EC 81 MG PO TBEC: 81.0000 mg | DELAYED_RELEASE_TABLET | Freq: Every day | ORAL | 11 refills | Status: DC

## 2015-12-16 NOTE — Patient Instructions (Addendum)
Please start taking Aspirin 81 mg - 1 per day   Please continue all other medications as before, and refills have been done if requested.  Please have the pharmacy call with any other refills you may need.  Please continue your efforts at being more active, low cholesterol diet, and weight control  Please keep your appointments with your specialists as you may have planned  Please go to the LAB in the Basement (turn left off the elevator) for the tests to be done today  You will be contacted by phone if any changes need to be made immediately.  Otherwise, you will receive a letter about your results with an explanation, but please check with MyChart first.  Please remember to sign up for MyChart if you have not done so, as this will be important to you in the future with finding out test results, communicating by private email, and scheduling acute appointments online when needed.  Please return in 6 months, or sooner if needed, with Lab testing done 3-5 days before

## 2015-12-16 NOTE — Progress Notes (Signed)
Subjective:    Patient ID: Russell PennerBuddy Bolle Jr., male    DOB: 1970/11/18, 45 y.o.   MRN: [id number]   HPI  Here to f/u; overall doing ok,  Pt denies chest pain, increasing sob or doe, wheezing, orthopnea, PND, increased LE swelling, palpitations, dizziness or syncope.  Pt denies new neurological symptoms such as new headache, or facial or extremity weakness or numbness.  Pt denies polydipsia, polyuria, or low sugar episode.   Pt denies new neurological symptoms such as new headache, or facial or extremity weakness or numbness.   Pt states overall good compliance with meds, mostly trying to follow appropriate diet, with wt overall stable,  but little exercise however. No other changes to basic hx Wt Readings from Last 3 Encounters:  12/16/15 190 lb (86.2 kg)  11/16/15 188 lb (85.3 kg)  06/15/15 185 lb (83.9 kg)   Past Medical History:  Diagnosis Date  . ASTHMA, CHILDHOOD 04/18/2009   Qualifier: Diagnosis of  By: Jonny RuizJohn MD, Len BlalockJames W   . BIPOLAR DISORDER UNSPECIFIED 04/18/2009   Qualifier: Diagnosis of  By: Jonny RuizJohn MD, Len BlalockJames W   . GERD (gastroesophageal reflux disease) 07/30/2013  . Hypertension   . Insomnia   . Multiple sclerosis (HCC) 2015  . Psoriasis 12/15/2013  . Ventral hernia without obstruction or gangrene 12/15/2013   Past Surgical History:  Procedure Laterality Date  . HERNIA REPAIR      reports that he has never smoked. He has never used smokeless tobacco. He reports that he does not drink alcohol or use drugs. family history includes COPD in his sister; Diabetes in his father; Fibromyalgia in his sister; Healthy in his sister; Heart disease in his father; Hypertension in his father and mother; Stroke in his father. Allergies  Allergen Reactions  . Penicillins Itching and Rash    Little bumps   Current Outpatient Prescriptions on File Prior to Visit  Medication Sig Dispense Refill  . amLODipine (NORVASC) 5 MG tablet Take 1 tablet (5 mg total) by mouth daily. 30 tablet 11  . COPAXONE  40 MG/ML SOSY INJECT 40 MG UNDER THE SKIN THREE TIMES A WEEK 36 mL 48  . irbesartan (AVAPRO) 150 MG tablet Take 1 tablet (150 mg total) by mouth daily. 30 tablet 11   No current facility-administered medications on file prior to visit.    Review of Systems  Constitutional: Negative for unusual diaphoresis or night sweats HENT: Negative for ear swelling or discharge Eyes: Negative for worsening visual haziness  Respiratory: Negative for choking and stridor.   Gastrointestinal: Negative for distension or worsening eructation Genitourinary: Negative for retention or change in urine volume.  Musculoskeletal: Negative for other MSK pain or swelling Skin: Negative for color change and worsening wound Neurological: Negative for tremors and numbness other than noted  Psychiatric/Behavioral: Negative for decreased concentration or agitation other than above   All other system neg per pt    Objective:   Physical Exam BP 132/70   Pulse 60   Temp 98.7 F (37.1 C) (Oral)   Resp 20   Wt 190 lb (86.2 kg)   SpO2 95%   BMI 29.32 kg/m  VS noted,  Constitutional: Pt appears in no apparent distress HENT: Head: NCAT.  Right Ear: External ear normal.  Left Ear: External ear normal.  Eyes: . Pupils are equal, round, and reactive to light. Conjunctivae and EOM are normal Neck: Normal range of motion. Neck supple.  Cardiovascular: Normal rate and regular rhythm.   Pulmonary/Chest: Effort  normal and breath sounds without rales or wheezing.  Neurological: Pt is alert. Not confused , motor grossly intact, o/w not done in detail Skin: Skin is warm. No rash, no LE edema Psychiatric: Pt behavior is normal. No agitation.  No other new exam changes    Assessment & Plan:

## 2015-12-16 NOTE — Progress Notes (Signed)
Pre visit review using our clinic review tool, if applicable. No additional management support is needed unless otherwise documented below in the visit note. 

## 2015-12-17 NOTE — Assessment & Plan Note (Signed)
stable overall by history and exam, recent data reviewed with pt, and pt to continue medical treatment as before,  to f/u any worsening symptoms or concerns Lab Results  Component Value Date   LDLCALC 110 (H) 06/08/2015   For improved diet, no new meds

## 2015-12-17 NOTE — Assessment & Plan Note (Signed)
stable overall by history and exam, recent data reviewed with pt, and pt to continue medical treatment as before,  to f/u any worsening symptoms or concerns No results found for: HGBA1C  Lab Results  Component Value Date   WBC 5.7 06/08/2015   HGB 14.9 06/08/2015   HCT 43.6 06/08/2015   PLT 191.0 06/08/2015   GLUCOSE 128 (H) 11/16/2015   CHOL 174 06/08/2015   TRIG 118.0 06/08/2015   HDL 40.30 06/08/2015   LDLCALC 110 (H) 06/08/2015   ALT 45 11/16/2015   AST 25 11/16/2015   NA 138 11/16/2015   K 4.5 11/16/2015   CL 107 11/16/2015   CREATININE 0.86 11/16/2015   BUN 15 11/16/2015   CO2 24 11/16/2015   TSH 1.89 06/08/2015   PSA 0.53 06/08/2015

## 2015-12-17 NOTE — Assessment & Plan Note (Signed)
stable overall by history and exam, recent data reviewed with pt, and pt to continue medical treatment as before,  to f/u any worsening symptoms or concerns BP Readings from Last 3 Encounters:  12/16/15 132/70  11/16/15 98/70  06/15/15 138/84

## 2016-03-13 ENCOUNTER — Other Ambulatory Visit: Payer: Self-pay | Admitting: *Deleted

## 2016-03-13 MED ORDER — IRBESARTAN 150 MG PO TABS: 150.0000 mg | ORAL_TABLET | Freq: Every day | ORAL | 2 refills | Status: DC

## 2016-03-13 MED ORDER — AMLODIPINE BESYLATE 5 MG PO TABS: 5.0000 mg | ORAL_TABLET | Freq: Every day | ORAL | 2 refills | Status: DC

## 2016-05-10 ENCOUNTER — Telehealth: Payer: Self-pay | Admitting: *Deleted

## 2016-05-10 NOTE — Telephone Encounter (Signed)
Patient called to let us know that he has had shingles.  He is still taking his medication but is having some shooting pains in his head.  His pain from 1 to 10 is a 15.  He is wondering if he needs to be seen sooner than Wednesday or if he needs imaging before his appointment.

## 2016-05-10 NOTE — Telephone Encounter (Signed)
I spoke with patient and he is on medication for the shingles and has pain medication.  He will keep appointment for next week to further discuss issues.

## 2016-05-10 NOTE — Telephone Encounter (Signed)
I'm sorry to hear this.  Nerve pain with shingles can be very painful - is he on any treatment for this (gabapentin, Lyrica)?  Keep the appointment on Wednesday and we can discuss.  [name] K. Allena Katz, DO

## 2016-05-16 ENCOUNTER — Ambulatory Visit (INDEPENDENT_AMBULATORY_CARE_PROVIDER_SITE_OTHER): Payer: BLUE CROSS/BLUE SHIELD | Admitting: Internal Medicine

## 2016-05-16 ENCOUNTER — Encounter: Payer: Self-pay | Admitting: Neurology

## 2016-05-16 ENCOUNTER — Ambulatory Visit (INDEPENDENT_AMBULATORY_CARE_PROVIDER_SITE_OTHER): Payer: BLUE CROSS/BLUE SHIELD | Admitting: Neurology

## 2016-05-16 ENCOUNTER — Other Ambulatory Visit (INDEPENDENT_AMBULATORY_CARE_PROVIDER_SITE_OTHER): Payer: BLUE CROSS/BLUE SHIELD

## 2016-05-16 ENCOUNTER — Encounter: Payer: Self-pay | Admitting: Internal Medicine

## 2016-05-16 VITALS — BP 140/80 | HR 68 | Temp 98.2°F | Ht 67.5 in | Wt 200.0 lb

## 2016-05-16 VITALS — BP 120/80 | HR 55 | Ht 67.5 in | Wt 200.3 lb

## 2016-05-16 DIAGNOSIS — B029 Zoster without complications: Secondary | ICD-10-CM | POA: Diagnosis not present

## 2016-05-16 DIAGNOSIS — Z0000 Encounter for general adult medical examination without abnormal findings: Secondary | ICD-10-CM | POA: Diagnosis not present

## 2016-05-16 DIAGNOSIS — G35 Multiple sclerosis: Secondary | ICD-10-CM

## 2016-05-16 DIAGNOSIS — Z0001 Encounter for general adult medical examination with abnormal findings: Secondary | ICD-10-CM

## 2016-05-16 DIAGNOSIS — I10 Essential (primary) hypertension: Secondary | ICD-10-CM | POA: Diagnosis not present

## 2016-05-16 DIAGNOSIS — R739 Hyperglycemia, unspecified: Secondary | ICD-10-CM

## 2016-05-16 LAB — BASIC METABOLIC PANEL
BUN: 13 mg/dL (ref 6–23)
CALCIUM: 9.1 mg/dL (ref 8.4–10.5)
CO2: 27 meq/L (ref 19–32)
Chloride: 105 mEq/L (ref 96–112)
Creatinine, Ser: 0.86 mg/dL (ref 0.40–1.50)
GFR: 101.31 mL/min (ref >60.00)
Glucose, Bld: 100 mg/dL — ABNORMAL HIGH (ref 70–99)
Potassium: 4.1 mEq/L (ref 3.5–5.1)
SODIUM: 139 meq/L (ref 135–145)

## 2016-05-16 LAB — HEPATIC FUNCTION PANEL
ALK PHOS: 101 U/L (ref 39–117)
ALT: 45 U/L (ref 0–53)
AST: 18 U/L (ref 0–37)
Albumin: 3.8 g/dL (ref 3.5–5.2)
BILIRUBIN TOTAL: 0.4 mg/dL (ref 0.2–1.2)
Bilirubin, Direct: 0.1 mg/dL (ref 0.0–0.3)
TOTAL PROTEIN: 6.3 g/dL (ref 6.0–8.3)

## 2016-05-16 LAB — CBC WITH DIFFERENTIAL/PLATELET
BASOS ABS: 0 10*3/uL (ref 0.0–0.1)
Basophils Relative: 0.5 % (ref 0.0–3.0)
EOS PCT: 1.1 % (ref 0.0–5.0)
Eosinophils Absolute: 0.1 10*3/uL (ref 0.0–0.7)
HCT: 42.7 % (ref 39.0–52.0)
Hemoglobin: 14.2 g/dL (ref 13.0–17.0)
LYMPHS ABS: 2.2 10*3/uL (ref 0.7–4.0)
Lymphocytes Relative: 27.1 % (ref 12.0–46.0)
MCHC: 33.2 g/dL (ref 30.0–36.0)
MCV: 88.1 fl (ref 78.0–100.0)
MONOS PCT: 9.1 % (ref 3.0–12.0)
Monocytes Absolute: 0.7 10*3/uL (ref 0.1–1.0)
NEUTROS ABS: 5.1 10*3/uL (ref 1.4–7.7)
NEUTROS PCT: 62.2 % (ref 43.0–77.0)
PLATELETS: 203 10*3/uL (ref 150.0–400.0)
RBC: 4.85 Mil/uL (ref 4.22–5.81)
RDW: 14.4 % (ref 11.5–15.5)
WBC: 8.2 10*3/uL (ref 4.0–10.5)

## 2016-05-16 LAB — URINALYSIS, ROUTINE W REFLEX MICROSCOPIC
Bilirubin Urine: NEGATIVE
Hgb urine dipstick: NEGATIVE
Ketones, ur: NEGATIVE
Leukocytes, UA: NEGATIVE
Nitrite: NEGATIVE
PH: 5.5 (ref 5.0–8.0)
RBC / HPF: NONE SEEN (ref 0–?)
SPECIFIC GRAVITY, URINE: 1.015 (ref 1.000–1.030)
TOTAL PROTEIN, URINE-UPE24: NEGATIVE
Urine Glucose: NEGATIVE
Urobilinogen, UA: 0.2 (ref 0.0–1.0)

## 2016-05-16 LAB — PSA: PSA: 0.55 ng/mL (ref 0.10–4.00)

## 2016-05-16 LAB — LIPID PANEL
Cholesterol: 169 mg/dL (ref 0–200)
HDL: 51.7 mg/dL (ref >39.00)
LDL Cholesterol: 79 mg/dL (ref 0–99)
NonHDL: 117.44
Total CHOL/HDL Ratio: 3
Triglycerides: 193 mg/dL — ABNORMAL HIGH (ref 0.0–149.0)
VLDL: 38.6 mg/dL (ref 0.0–40.0)

## 2016-05-16 LAB — HEMOGLOBIN A1C: HEMOGLOBIN A1C: 6.1 % (ref 4.6–6.5)

## 2016-05-16 LAB — TSH: TSH: 1.99 u[IU]/mL (ref 0.35–4.50)

## 2016-05-16 NOTE — Assessment & Plan Note (Signed)
stable overall by history and exam, recent data reviewed with pt, and pt to continue medical treatment as before,  to f/u any worsening symptoms or concerns BP Readings from Last 3 Encounters:  05/16/16 140/80  05/16/16 120/80  12/16/15 132/70

## 2016-05-16 NOTE — Patient Instructions (Addendum)
1.  Continue Copaxone as you are taking 2.  Continue vitamin D 4000 daily 3.  You are due for annual eye exam  Return to clinic in 6 months

## 2016-05-16 NOTE — Assessment & Plan Note (Signed)

## 2016-05-16 NOTE — Progress Notes (Signed)
Follow-up Visit   Date: 05/16/16    Russell Moses. MRN: [id number] DOB: 20-Mar-1970   Interim History: Russell Moses. is a 46 y.o. right-handed Caucasian male with history of hypertension, bipolar disorder, asthma, herpetic zoster, and anxiety returning to the clinic for follow-up of multiple sclerosis.  The patient was accompanied to the clinic by self.  History of present illness: In 2014, he developed a tingling sensation over his head and get "swimming headed", which lasted a few seconds and spontaneously resolved. He felt that his blood pressure was going up so went to see. [name], his PCP. Starting in February 2015, he developed tremors of the hands and bilateral leg weakness. He feels that he gets fatigued easily. On March 5th 2015, he was at work and started shaking, lost his energy, felt as if his head was heavy, and was short of breath so called his mom who took him to [facility]. He has CT head and lab work was unrevealing. He was discharged once he felt better.   His MRI brain in April 2015 showed white matter changes concerning for demyelinating disease.  He continues to have worsening shakiness of the hands, episodic weakness, and fatigues quickly.  He has developed new agoraphobia and is scheduled to see a psychiatrist for this. Patient underwent CSF testing which was which shows signs of inflammation and has completed 3-days of IVMP (5/28, 5/29 and 5/30).  Since starting steroids, he is able to function and thinking is better ("less foggy").  He started copaxone on 10/2013 and has been tolerating it well, besides injection site lipodystrophy.  Annual surveillance imaging has shown stable disease burden and he has not had any relapses since initial diagnosis in Spring 2015.  UPDATE 11/16/2015:  He has been tolerating Copaxone and also reports improvement of his psoriasis.  Unfortunately, his last co-pay for the medication was >$1000, whereas previously he was paying  $100.  He has no new neurological complaints.   He does not takes vitamin D due to GI upset.  He has started taking many supplements which he manages himself.    UPDATE 05/16/2016:  He was doing well until April 1st and developed shingles over the right shoulder and neck for which he went to the ER.  He developed severe sharp, pulsating pain over the right posterior scalp.  He was treated with prednisone and acyclovir which has resolved his pain.  He had MRI brain in October 2017 is stable without any new lesions. He continues to tolerate Copaxone well and was able to get financial assistance.    Medications:  Current Outpatient Prescriptions on File Prior to Visit  Medication Sig Dispense Refill  . amLODipine (NORVASC) 5 MG tablet Take 1 tablet (5 mg total) by mouth daily. 90 tablet 2  . aspirin EC 81 MG tablet Take 1 tablet (81 mg total) by mouth daily. 90 tablet 11  . COPAXONE 40 MG/ML SOSY INJECT 40 MG UNDER THE SKIN THREE TIMES A WEEK 36 mL 48  . irbesartan (AVAPRO) 150 MG tablet Take 1 tablet (150 mg total) by mouth daily. 90 tablet 2   No current facility-administered medications on file prior to visit.     Allergies:  Allergies  Allergen Reactions  . Penicillins Itching and Rash    Little bumps     Review of Systems:  CONSTITUTIONAL: No fevers, chills, night sweats, or weight loss.   EYES: No visual changes or eye pain ENT: No hearing changes.  No history of nose bleeds.   RESPIRATORY: No cough, wheezing and shortness of breath.   CARDIOVASCULAR: Negative for chest pain, and palpitations.   GI: Negative for abdominal discomfort, blood in stools or black stools.  No recent change in bowel habits.   GU:  No history of incontinence.   MUSCLOSKELETAL: No history of joint pain or swelling.  No myalgias.   SKIN: Negative for lesions, + rash, and itching.   ENDOCRINE: Negative for cold or heat intolerance, polydipsia or goiter.   PSYCH: No depression or anxiety symptoms.   NEURO:  As Above.   Vital Signs:  BP 120/80   Pulse (!) 55   Ht 5' 7.5" (1.715 m)   Wt 200 lb 5 oz (90.9 kg)   SpO2 97%   BMI 30.91 kg/m   Gen:  Well-appearing Skin:  There is erythematous macular rash over the right neck and posterior scalp - healing, no open lesions.   Neurological Exam: MENTAL STATUS including orientation to time, place, person, recent and remote memory, attention span and concentration, language, and fund of knowledge is normal.  Speech is not dysarthric.  CRANIAL NERVES:  Normal fundoscopic exam. Pupils equal round and reactive to light.  Normal conjugate, extra-ocular eye movements in all directions of gaze. No ptosis.  Face is symmetric. Palate elevates symmetrically.  Tongue is midline.  MOTOR:  Motor strength is 5/5 in all extremities.  No tremor. No pronator drift.  Tone is normal.    MSRs:   Reflexes are 2+/4 throughout and symmetric.  SENSORY:  Intact to vibration and light touch throughout.  COORDINATION/GAIT:  Gait narrow based and stable.   Data: MRI brain wwo contrast 11/17/2015:  No significant changes since 12/16/2014.  No abnormal enhancement.  MRI brain wwo contrast 12/16/2014: 1. Unchanged supratentorial white matter lesions. No definite new lesions. No abnormal enhancement or restricted diffusion. 2. 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 3. Paransal sinus inflammatory change, correlate clinically.  MRI brain wwo contrast 12/25/2013: Multiple lesoins in the periventricular and juxtacortical white matter regions bilaterally. There is no longer enhancement or diffusion abnormality. No new lesions MRI cervical spine wwo contrast: Normal  MRI brain wwo contrast 05/28/2013:  Multiple lesions in the periventricular and juxtacortical white matter bilaterally, one of which enhances.  The pattern and distribution of these lesions suggest demyelinating disease such as  multiple sclerosis.  MRI cervical spine wwo contrast 06/04/2013:  Normal MRI cervical spine   EMG right upper and lower extremity 05/14/2013:  There is electrophysiological evidence of an intraspinal canal lesion (i.e. radiculopathy) affecting the L5 nerve root/segment bilaterally. Overall, these changes are mild in degree electrically and worse on the right side. There is no evidence of a generalized sensorimotor polyneuropathy, cervical radiculopathy or diffuse myopathy.   Labs 03/27/2013:  TSH 0.73, B12 847, ESR 14, vitamin D 37 Labs 05/14/2013:  CK 26, aldolase 26, copper 91, MG panel negative  CSF 06/22/2013: R0 W0 G61 P27  ACE 4, Lyme neg, MBP 61, IgG 1.6 OCB The patient's CSF contains >5 well defined gamma restriction bands that are also present in the patient's  corresponding serum sample, but some bands in the CSF are more prominent.  CSF cytology with few mononuclear cells CSF cytometry was not performed due to insufficient sample.   IMPRESSION/PLAN: Relapsing-remitting multiple sclerosis, diagnosed May 2015  - Presenting symptoms:  generalized fatigue and tremors, completed 3-day course of IVMP in  May 2015 with marked improvement  - Imaging of his brain shows periventricular and juxtacortical white matter bilaterally; normal cervical cord.   - He started Copaxone on 10/2013 and has been stable without any relapses or disease progression (MRI brain from Oct 2017 is stable)  - Clinically, he is doing well.  No deficits on exam.  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  - Continue Copaxone 26m three times weekly.    - Continue vitamin D 4000U daily   - Annual eye exam recommended  Herpes zoster involving the right neck - improving.  He is no longer having neuralgia since starting prednisone and acyclovir.  Return to clinic in 6 months  The duration of this appointment visit was 30 minutes of face-to-face time with the  patient.  Greater than 50% of this time was spent in counseling, explanation of diagnosis, planning of further management, and coordination of care.   Thank you for allowing me to participate in patient's care.  If I can answer any additional questions, I would be pleased to do so.    Sincerely,    [name] K. PPosey Pronto DO

## 2016-05-16 NOTE — Assessment & Plan Note (Addendum)
asympt,  to f/u any worsening symptoms or concerns, f/u lab today

## 2016-05-16 NOTE — Progress Notes (Signed)
Subjective:    Patient ID: Russell Penner., male    DOB: 1970-09-19, 46 y.o.   MRN: [id number]  HPI  Here for wellness and f/u;  Overall doing ok;  Pt denies Chest pain, worsening SOB, DOE, wheezing, orthopnea, PND, worsening LE edema, palpitations, dizziness or syncope.  Pt denies neurological change such as new headache, facial or extremity weakness.  Pt denies polydipsia, polyuria, or low sugar symptoms. Pt states overall good compliance with treatment and medications, good tolerability, and has been trying to follow appropriate diet.  Pt denies worsening depressive symptoms, suicidal ideation or panic. No fever, night sweats, wt loss, loss of appetite, or other constitutional symptoms.  Pt states good ability with ADL's, has low fall risk, home safety reviewed and adequate, no other significant changes in hearing or vision, and only occasionally active with exercise. Has recently developed shingles without complication, tx with acyclovir, prednisone and a rare vicodin prn.  BP was better at neuro appt today Past Medical History:  Diagnosis Date  . ASTHMA, CHILDHOOD 04/18/2009   Qualifier: Diagnosis of  By: Jonny Ruiz MD, Len Blalock   . BIPOLAR DISORDER UNSPECIFIED 04/18/2009   Qualifier: Diagnosis of  By: Jonny Ruiz MD, Len Blalock   . GERD (gastroesophageal reflux disease) 07/30/2013  . Hypertension   . Insomnia   . Multiple sclerosis (HCC) 2015  . Psoriasis 12/15/2013  . Ventral hernia without obstruction or gangrene 12/15/2013   Past Surgical History:  Procedure Laterality Date  . HERNIA REPAIR      reports that he has never smoked. He has never used smokeless tobacco. He reports that he does not drink alcohol or use drugs. family history includes COPD in his sister; Diabetes in his father; Fibromyalgia in his sister; Healthy in his sister; Heart disease in his father; Hypertension in his father and mother; Stroke in his father. Allergies  Allergen Reactions  . Penicillins Itching and Rash    Little  bumps   Current Outpatient Prescriptions on File Prior to Visit  Medication Sig Dispense Refill  . acyclovir (ZOVIRAX) 800 MG tablet Take 800 mg by mouth 5 (five) times daily.    Marland Kitchen amLODipine (NORVASC) 5 MG tablet Take 1 tablet (5 mg total) by mouth daily. 90 tablet 2  . aspirin EC 81 MG tablet Take 1 tablet (81 mg total) by mouth daily. 90 tablet 11  . COPAXONE 40 MG/ML SOSY INJECT 40 MG UNDER THE SKIN THREE TIMES A WEEK 36 mL 48  . irbesartan (AVAPRO) 150 MG tablet Take 1 tablet (150 mg total) by mouth daily. 90 tablet 2  . predniSONE (STERAPRED UNI-PAK 21 TAB) 10 MG (21) TBPK tablet Take by mouth daily.     No current facility-administered medications on file prior to visit.    Review of Systems Constitutional: Negative for other unusual diaphoresis, sweats, appetite or weight changes HENT: Negative for other worsening hearing loss, ear pain, facial swelling, mouth sores or neck stiffness.   Eyes: Negative for other worsening pain, redness or other visual disturbance.  Respiratory: Negative for other stridor or swelling Cardiovascular: Negative for other palpitations or other chest pain  Gastrointestinal: Negative for worsening diarrhea or loose stools, blood in stool, distention or other pain Genitourinary: Negative for hematuria, flank pain or other change in urine volume.  Musculoskeletal: Negative for myalgias or other joint swelling.  Skin: Negative for other color change, or other wound or worsening drainage.  Neurological: Negative for other syncope or numbness. Hematological: Negative for other adenopathy or  swelling Psychiatric/Behavioral: Negative for hallucinations, other worsening agitation, SI, self-injury, or new decreased concentration All other system neg per pt    Objective:   Physical Exam BP 140/80   Pulse 68   Temp 98.2 F (36.8 C) (Oral)   Ht 5' 7.5" (1.715 m)   Wt 200 lb (90.7 kg)   SpO2 99%   BMI 30.86 kg/m  VS noted,  Constitutional: Pt is oriented  to person, place, and time. Appears well-developed and well-nourished, in no significant distress and comfortable Head: Normocephalic and atraumatic  Eyes: Conjunctivae and EOM are normal. Pupils are equal, round, and reactive to light Right Ear: External ear normal without discharge Left Ear: External ear normal without discharge Nose: Nose without discharge or deformity Mouth/Throat: Oropharynx is without other ulcerations and moist  Neck: Normal range of motion. Neck supple. No JVD present. No tracheal deviation present or significant neck LA or mass Cardiovascular: Normal rate, regular rhythm, normal heart sounds and intact distal pulses.   Pulmonary/Chest: WOB normal and breath sounds without rales or wheezing  Abdominal: Soft. Bowel sounds are normal. NT. No HSM  Musculoskeletal: Normal range of motion. Exhibits no edema Lymphadenopathy: Has no other cervical adenopathy.  Neurological: Pt is alert and oriented to person, place, and time. Pt has normal reflexes. No cranial nerve deficit. Motor grossly intact, Gait intact Skin: Skin is warm and dry. No rash noted or new ulcerations, right neck shingles rash improving Psychiatric:  Has mild nervous mood and affect. Behavior is normal without agitation No other exam findings    Assessment & Plan:

## 2016-05-16 NOTE — Patient Instructions (Signed)
Please continue all other medications as before, and refills have been done if requested.  Please have the pharmacy call with any other refills you may need.  Please continue your efforts at being more active, low cholesterol diet, and weight control.  You are otherwise up to date with prevention measures today.  Please keep your appointments with your specialists as you may have planned  Your lab work was done today  You will be contacted by phone if any changes need to be made immediately.  Otherwise, you will receive a letter about your results with an explanation, but please check with MyChart first.  Please remember to sign up for MyChart if you have not done so, as this will be important to you in the future with finding out test results, communicating by private email, and scheduling acute appointments online when needed.  Please return in 1 year for your yearly visit, or sooner if needed, with Lab testing done 3-5 days before  OK to cancel your May 2018 appt

## 2016-05-16 NOTE — Progress Notes (Signed)
Pre visit review using our clinic review tool, if applicable. No additional management support is needed unless otherwise documented below in the visit note. 

## 2016-06-06 ENCOUNTER — Telehealth: Payer: Self-pay | Admitting: Neurology

## 2016-06-06 NOTE — Telephone Encounter (Signed)
Caller: [name]  Urgent? No  Reason for the call: Needs to have his Copaxone specifically to stay with the copay assistance. He cannot go off the name brand. He said a letter will be sent to the office regarding this. He just wants to make sure the office received it. Thanks

## 2016-06-07 NOTE — Telephone Encounter (Signed)
PT called today and said he did not receive a call back yet/Dawn

## 2016-06-07 NOTE — Telephone Encounter (Signed)
I spoke with patient and he gave me a number to call to make sure he stays on the brand name copaxone with the copay assistance program.

## 2016-06-14 ENCOUNTER — Ambulatory Visit: Payer: BLUE CROSS/BLUE SHIELD | Admitting: Internal Medicine

## 2016-07-04 ENCOUNTER — Encounter: Payer: Self-pay | Admitting: Neurology

## 2016-07-05 ENCOUNTER — Other Ambulatory Visit: Payer: Self-pay | Admitting: *Deleted

## 2016-07-05 MED ORDER — GLATIRAMER ACETATE 40 MG/ML ~~LOC~~ SOSY: PREFILLED_SYRINGE | SUBCUTANEOUS | 11 refills | Status: DC

## 2016-07-12 ENCOUNTER — Telehealth: Payer: Self-pay | Admitting: Neurology

## 2016-07-12 NOTE — Telephone Encounter (Signed)
Caller: [facility]  Urgent? No  Reason for the call: They received a Prescription from another pharmacy regarding the Copaxone medication. They would like to verify the medication. The ref # is [id number]. Please call. Thanks

## 2016-07-12 NOTE — Telephone Encounter (Signed)
Called [facility] and updated patient's information.  Rx is now being processed.

## 2016-07-20 ENCOUNTER — Telehealth: Payer: Self-pay | Admitting: Neurology

## 2016-07-20 NOTE — Telephone Encounter (Signed)
Rx called in for 3 month supply.

## 2016-07-20 NOTE — Telephone Encounter (Signed)
PT called and has a question regarding his medication Copaxone, leave message he will check it on break, only received 12 syringes and usually gets more than that

## 2016-07-20 NOTE — Telephone Encounter (Signed)
Patient called in to let you know that he uses [facility]. He also was wondering why they only sent 12 syringes? Thanks

## 2016-07-23 ENCOUNTER — Encounter: Payer: Self-pay | Admitting: Neurology

## 2016-07-23 NOTE — Telephone Encounter (Signed)
PT called and said he left a message a few days ago and has not heard from anyone yet regarding  Copaxone

## 2016-11-07 ENCOUNTER — Telehealth: Payer: Self-pay | Admitting: Neurology

## 2016-11-07 NOTE — Telephone Encounter (Signed)
Needing Prior Auth on a medication. Please call [phone]. Thanks

## 2016-11-08 ENCOUNTER — Ambulatory Visit (INDEPENDENT_AMBULATORY_CARE_PROVIDER_SITE_OTHER): Payer: BLUE CROSS/BLUE SHIELD | Admitting: Internal Medicine

## 2016-11-08 ENCOUNTER — Encounter: Payer: Self-pay | Admitting: Internal Medicine

## 2016-11-08 VITALS — BP 138/86 | HR 67 | Temp 98.1°F | Ht 67.5 in | Wt 202.0 lb

## 2016-11-08 DIAGNOSIS — L409 Psoriasis, unspecified: Secondary | ICD-10-CM | POA: Diagnosis not present

## 2016-11-08 DIAGNOSIS — J309 Allergic rhinitis, unspecified: Secondary | ICD-10-CM | POA: Diagnosis not present

## 2016-11-08 DIAGNOSIS — I10 Essential (primary) hypertension: Secondary | ICD-10-CM

## 2016-11-08 DIAGNOSIS — R739 Hyperglycemia, unspecified: Secondary | ICD-10-CM | POA: Diagnosis not present

## 2016-11-08 MED ORDER — MECLIZINE HCL 12.5 MG PO TABS: 12.5000 mg | ORAL_TABLET | Freq: Three times a day (TID) | ORAL | 1 refills | Status: AC | PRN

## 2016-11-08 MED ORDER — LEVOFLOXACIN 500 MG PO TABS: 500.0000 mg | ORAL_TABLET | Freq: Every day | ORAL | 0 refills | Status: AC

## 2016-11-08 MED ORDER — METHYLPREDNISOLONE ACETATE 80 MG/ML IJ SUSP
80.0000 mg | Freq: Once | INTRAMUSCULAR | Status: AC
  Administered 2016-11-08: 80 mg via INTRAMUSCULAR

## 2016-11-08 MED ORDER — PREDNISONE 10 MG PO TABS: ORAL_TABLET | ORAL | 0 refills | Status: DC

## 2016-11-08 NOTE — Assessment & Plan Note (Signed)
Asympt,  Lab Results  Component Value Date   HGBA1C 6.1 05/16/2016  stable overall by history and exam, recent data reviewed with pt, and pt to continue medical treatment as before,  to f/u any worsening symptoms or concerns, to call for onset polys or cbg > 200

## 2016-11-08 NOTE — Progress Notes (Signed)
Subjective:    Patient ID: Russell Penner., male    DOB: 11-22-1970, 46 y.o.   MRN: [id number]  HPI   Here with c/o dizziness at about 9/22 and points to whole upper head, assoc with nausea and epigastric "discomfort" but "not a pain, and lack of energy.  Lorie Phenix like he had to et every 2 hrs, and sweat to the back of the neck, and pillow wet in the AM  Seems to be assoc with fatigue.  Dizziness maybe improved with cold air, and mello yellow helps the fatigue.   Pt denies fever, wt loss,, loss of appetite, or other constitutional symptoms.  No HA, ST, sinus pain, cough (except for very short lived ST and cough one day then resolved), and Pt denies chest pain, increased sob or doe, wheezing, orthopnea, PND, palpitations, or syncope, though has noticed a few days recently of painless trace bilat leg swelling where the socks seemed to make a dent, but none again in last few days.  Has mult LE varicosities..  Seen at Urgent care 9/27, tx with what sounds like depomedrol IM, and doxycycline and zyrtec.  Has been on copaxone for some time without difficulty prior to 9/22. Denies worsening reflux, dysphagia,  bowel change or blood.  Denies worsening depressive symptoms, suicidal ideation, or panic.  Pt denies new neurological symptoms such as new headache, or facial or extremity weakness or numbness  Does also have mild psoriasis flare to the hands as well, some improved recently with the steroid shot at [facility] . Past Medical History:  Diagnosis Date  . ASTHMA, CHILDHOOD 04/18/2009   Qualifier: Diagnosis of  By: Jonny Ruiz MD, Len Blalock   . BIPOLAR DISORDER UNSPECIFIED 04/18/2009   Qualifier: Diagnosis of  By: Jonny Ruiz MD, Len Blalock   . GERD (gastroesophageal reflux disease) 07/30/2013  . Hypertension   . Insomnia   . Multiple sclerosis (HCC) 2015  . Psoriasis 12/15/2013  . Ventral hernia without obstruction or gangrene 12/15/2013   Past Surgical History:  Procedure Laterality Date  . HERNIA REPAIR      reports that he  has never smoked. He has never used smokeless tobacco. He reports that he does not drink alcohol or use drugs. family history includes COPD in his sister; Diabetes in his father; Fibromyalgia in his sister; Healthy in his sister; Heart disease in his father; Hypertension in his father and mother; Stroke in his father. Allergies  Allergen Reactions  . Penicillins Itching and Rash    Little bumps   Current Outpatient Prescriptions on File Prior to Visit  Medication Sig Dispense Refill  . acyclovir (ZOVIRAX) 800 MG tablet Take 800 mg by mouth 5 (five) times daily.    Marland Kitchen amLODipine (NORVASC) 5 MG tablet Take 1 tablet (5 mg total) by mouth daily. 90 tablet 2  . aspirin EC 81 MG tablet Take 1 tablet (81 mg total) by mouth daily. 90 tablet 11  . Glatiramer Acetate (COPAXONE) 40 MG/ML SOSY INJECT 40 MG UNDER THE SKIN THREE TIMES A WEEK 12 Syringe 11  . irbesartan (AVAPRO) 150 MG tablet Take 1 tablet (150 mg total) by mouth daily. 90 tablet 2   No current facility-administered medications on file prior to visit.    Review of Systems  Constitutional: Negative for other unusual diaphoresis or sweats HENT: Negative for ear discharge or swelling Eyes: Negative for other worsening visual disturbances Respiratory: Negative for stridor or other swelling  Gastrointestinal: Negative for worsening distension or other blood Genitourinary:  Negative for retention or other urinary change Musculoskeletal: Negative for other MSK pain or swelling Skin: Negative for color change or other new lesions Neurological: Negative for worsening tremors and other numbness  Psychiatric/Behavioral: Negative for worsening agitation or other fatigue All other system neg per pt      Objective:   Physical Exam BP 138/86   Pulse 67   Temp 98.1 F (36.7 C) (Oral)   Ht 5' 7.5" (1.715 m)   Wt 202 lb (91.6 kg)   SpO2 98%   BMI 31.17 kg/m  VS noted,  Constitutional: Pt appears in NAD HENT: Head: NCAT.  Right Ear: External  ear normal. ,right TM pale white, no effusion Left Ear: External ear normal. , left TM with severe erythema and mild effusion, bulging Eyes: . Pupils are equal, round, and reactive to light. Conjunctivae and EOM are normal Nose: without d/c or deformity Neck: Neck supple. Gross normal ROM Cardiovascular: Normal rate and regular rhythm.   Pulmonary/Chest: Effort normal and breath sounds without rales or wheezing.  Abd:  Soft, NT, ND, + BS, no HSM Neurological: Pt is alert. At baseline orientation, motor grossly intact, cbl intact, cn 2-12 intact, gait stable Skin: Skin is warm. + bilat hand/fingers non tender rashes, other new lesions, no LE edema but several other varicosities Psychiatric: Pt behavior is normal without agitation  No other exam findings Lab Results  Component Value Date   WBC 8.2 05/16/2016   HGB 14.2 05/16/2016   HCT 42.7 05/16/2016   PLT 203.0 05/16/2016   GLUCOSE 100 (H) 05/16/2016   CHOL 169 05/16/2016   TRIG 193.0 (H) 05/16/2016   HDL 51.70 05/16/2016   LDLCALC 79 05/16/2016   ALT 45 05/16/2016   AST 18 05/16/2016   NA 139 05/16/2016   K 4.1 05/16/2016   CL 105 05/16/2016   CREATININE 0.86 05/16/2016   BUN 13 05/16/2016   CO2 27 05/16/2016   TSH 1.99 05/16/2016   PSA 0.55 05/16/2016   HGBA1C 6.1 05/16/2016      Assessment & Plan:

## 2016-11-08 NOTE — Assessment & Plan Note (Signed)
Likely seasonal flare involving left ear congestion primarily, eustachain valve dysfxn and dizziness with nausea.  For depomedrol IM 80, predpac asd, cont cetirizine and meclizine prn pain

## 2016-11-08 NOTE — Assessment & Plan Note (Signed)
Mild, some improved now, but likely to improve some more with todays depomedrol as well, cont topical steroid prn

## 2016-11-08 NOTE — Patient Instructions (Addendum)
You had the steroid shot today  OK to stop the doxycycline  Please take all new medication as prescribed - the prednisone, and levaquin antibiotic  Please take all new medication as prescribed - meclizine as needed for dizziness  Please continue all other medications as before, and refills have been done if requested.  Please have the pharmacy call with any other refills you may need.  Please continue your efforts at being more active, low cholesterol diet, and weight control, as this may help with lessening the worsening of the venous swelling of the legs  Please keep your appointments with your specialists as you may have planned

## 2016-11-08 NOTE — Assessment & Plan Note (Signed)
stable overall by history and exam, recent data reviewed with pt, and pt to continue medical treatment as before,  to f/u any worsening symptoms or concerns BP Readings from Last 3 Encounters:  11/08/16 138/86  05/16/16 140/80  05/16/16 120/80

## 2016-11-13 ENCOUNTER — Telehealth: Payer: Self-pay | Admitting: Internal Medicine

## 2016-11-13 MED ORDER — IRBESARTAN 300 MG PO TABS
300.0000 mg | ORAL_TABLET | Freq: Every day | ORAL | 3 refills | Status: DC
Start: 2016-11-13 — End: 2017-05-21

## 2016-11-13 NOTE — Telephone Encounter (Signed)
Pt called and states since he was prescribred prednisone and Meclizine his BP has been high. Yesterday he took his Amlodipine at 5:30pm and before bed he took his BP and it was 168/85 and that was at either 9pm or 10pm.  Today he took his BP and it was 158/75 and he took his irbesartan after taking his BP and it went down a little.  He states his only symptom is feeling hot and has to constantly cool himelf down.  Please advise and call back  Can LVM pt is at work

## 2016-11-13 NOTE — Telephone Encounter (Signed)
Ok to try increased irbesartan to 300 mg per day

## 2016-11-14 NOTE — Telephone Encounter (Signed)
Pt stated that he has already tried increasing to the 300mg  and he didn't like how tired, sluggish and unable to function at work. He will continue to record his BP. I suggested he make an appt if he still has concerns about his BP. He expressed understanding.

## 2016-11-15 ENCOUNTER — Ambulatory Visit (INDEPENDENT_AMBULATORY_CARE_PROVIDER_SITE_OTHER): Payer: BLUE CROSS/BLUE SHIELD | Admitting: Neurology

## 2016-11-15 ENCOUNTER — Encounter: Payer: Self-pay | Admitting: Neurology

## 2016-11-15 VITALS — BP 144/80 | HR 65 | Ht 67.5 in | Wt 200.2 lb

## 2016-11-15 DIAGNOSIS — G35 Multiple sclerosis: Secondary | ICD-10-CM | POA: Diagnosis not present

## 2016-11-15 MED ORDER — GLATIRAMER ACETATE 40 MG/ML ~~LOC~~ SOSY: PREFILLED_SYRINGE | SUBCUTANEOUS | 11 refills | Status: DC

## 2016-11-15 MED ORDER — DIAZEPAM 5 MG PO TABS: ORAL_TABLET | ORAL | 0 refills | Status: DC

## 2016-11-15 NOTE — Patient Instructions (Signed)
1.  MRI brain wwo contrast 2.  Continue Copaxone  three times per week.  If your insurance does not approve this, we will need to switch you to generic.

## 2016-11-15 NOTE — Progress Notes (Signed)
Follow-up Visit   Date: 11/15/16    Russell Moses. MRN: [id number] DOB: Apr 23, 1970   Interim History: Russell Jesper Stirewalt. is a 46 y.o. right-handed Caucasian male with history of hypertension, bipolar disorder, asthma, herpetic zoster, and anxiety returning to the clinic for follow-up of multiple sclerosis.  The patient was accompanied to the clinic by self.  History of present illness: In 2014, he developed a tingling sensation over his head and get "swimming headed", which lasted a few seconds and spontaneously resolved. He felt that his blood pressure was going up so went to see. [name], his PCP. Starting in February 2015, he developed tremors of the hands and bilateral leg weakness. He feels that he gets fatigued easily. On March 5th 2015, he was at work and started shaking, lost his energy, felt as if his head was heavy, and was short of breath so called his mom who took him to [facility]. He has CT head and lab work was unrevealing. He was discharged once he felt better.   His MRI brain in April 2015 showed white matter changes concerning for demyelinating disease.  He continues to have worsening shakiness of the hands, episodic weakness, and fatigues quickly.  He has developed new agoraphobia and is scheduled to see a psychiatrist for this. Patient underwent CSF testing which was which shows signs of inflammation and has completed 3-days of IVMP (5/28, 5/29 and 5/30).  Since starting steroids, he is able to function and thinking is better ("less foggy").  He started copaxone on 10/2013 and has been tolerating it well, besides injection site lipodystrophy.  Annual surveillance imaging has shown stable disease burden and he has not had any relapses since initial diagnosis in Spring 2015.  UPDATE 11/16/2015:  He has been tolerating Copaxone and also reports improvement of his psoriasis.  Unfortunately, his last co-pay for the medication was >$1000, whereas previously he was paying  $100.  He has no new neurological complaints.   He does not takes vitamin D due to GI upset.  He has started taking many supplements which he manages himself.    UPDATE 05/16/2016:  He was doing well until April 1st and developed shingles over the right shoulder and neck for which he went to the ER.  He developed severe sharp, pulsating pain over the right posterior scalp.  He was treated with prednisone and acyclovir which has resolved his pain.  He had MRI brain in October 2017 is stable without any new lesions. He continues to tolerate Copaxone well and was able to get financial assistance.   UPDATE 11/15/2016:  He is here for 6 month appointment.  He has been doing well with no new neurological concerns.  He is doing well on Copaxone, but now that it is available in generic, we are having difficulty trying to get it approved. He has many questions about the compound make-up of generic vs. brand formulation.  He has been having issues with sinusitis and is taking a prednisone taper.  He had eye exam earlier this year which showed mild changes in acuity.    Medications:  Current Outpatient Prescriptions on File Prior to Visit  Medication Sig Dispense Refill  . acyclovir (ZOVIRAX) 800 MG tablet Take 800 mg by mouth 5 (five) times daily.    Marland Kitchen amLODipine (NORVASC) 5 MG tablet Take 1 tablet (5 mg total) by mouth daily. 90 tablet 2  . aspirin EC 81 MG tablet Take 1 tablet (81 mg total) by  mouth daily. 90 tablet 11  . Glatiramer Acetate (COPAXONE) 40 MG/ML SOSY INJECT 40 MG UNDER THE SKIN THREE TIMES A WEEK 12 Syringe 11  . irbesartan (AVAPRO) 300 MG tablet Take 1 tablet (300 mg total) by mouth daily. (Patient taking differently: Take 150 mg by mouth daily. ) 90 tablet 3  . levofloxacin (LEVAQUIN) 500 MG tablet Take 1 tablet (500 mg total) by mouth daily. 10 tablet 0  . meclizine (ANTIVERT) 12.5 MG tablet Take 1 tablet (12.5 mg total) by mouth 3 (three) times daily as needed for dizziness. 30 tablet 1  .  predniSONE (DELTASONE) 10 MG tablet 3 tabs by mouth per day for 3 days,2tabs per day for 3 days,1tab per day for 3 days 18 tablet 0   No current facility-administered medications on file prior to visit.     Allergies:  Allergies  Allergen Reactions  . Penicillins Itching and Rash    Little bumps     Review of Systems:  CONSTITUTIONAL: No fevers, chills, night sweats, or weight loss.   EYES: No visual changes or eye pain ENT: No hearing changes.  No history of nose bleeds.   RESPIRATORY: No cough, wheezing and shortness of breath.   CARDIOVASCULAR: Negative for chest pain, and palpitations.   GI: Negative for abdominal discomfort, blood in stools or black stools.  No recent change in bowel habits.   GU:  No history of incontinence.   MUSCLOSKELETAL: No history of joint pain or swelling.  No myalgias.   SKIN: Negative for lesions, + rash, and itching.   ENDOCRINE: Negative for cold or heat intolerance, polydipsia or goiter.   PSYCH: No depression or anxiety symptoms.   NEURO: As Above.   Vital Signs:  BP (!) 144/80   Pulse 65   Ht 5' 7.5" (1.715 m)   Wt 200 lb 4 oz (90.8 kg)   SpO2 97%   BMI 30.90 kg/m   Gen: Well appearing, comfortable  Neurological Exam: MENTAL STATUS including orientation to time, place, person, recent and remote memory, attention span and concentration, language, and fund of knowledge is normal.  Speech is not dysarthric.  CRANIAL NERVES: No visual field defects. Pupils equal round and reactive to light.  Normal conjugate, extra-ocular eye movements in all directions of gaze.  No ptosis. Normal facial sensation.  Face is symmetric. Palate elevates symmetrically.  Tongue is midline.  MOTOR:  Motor strength is 5/5 in all extremities.  No pronator drift.  Tone is normal.    MSRs:  Reflexes are 2+/4 throughout.  SENSORY:  Intact to vibration.  COORDINATION/GAIT:  Normal finger-to- nose-finger and heel-to-shin.  Intact rapid alternating movements  bilaterally.  Gait narrow based and stable.    Data: MRI brain wwo contrast 11/17/2015:  No significant changes since 12/16/2014.  No abnormal enhancement.  MRI brain wwo contrast 12/16/2014: 1. Unchanged supratentorial white matter lesions. No definite new lesions. No abnormal enhancement or restricted diffusion. 2. 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 3. Paransal sinus inflammatory change, correlate clinically.  MRI brain wwo contrast 12/25/2013: Multiple lesoins in the periventricular and juxtacortical white matter regions bilaterally. There is no longer enhancement or diffusion abnormality. No new lesions MRI cervical spine wwo contrast: Normal  MRI brain wwo contrast 05/28/2013:  Multiple lesions in the periventricular and juxtacortical white matter bilaterally, one of which enhances.  The pattern and distribution of these lesions suggest demyelinating disease such as multiple  sclerosis.  MRI cervical spine wwo contrast 06/04/2013:  Normal MRI cervical spine   EMG right upper and lower extremity 05/14/2013:  There is electrophysiological evidence of an intraspinal canal lesion (i.e. radiculopathy) affecting the L5 nerve root/segment bilaterally. Overall, these changes are mild in degree electrically and worse on the right side. There is no evidence of a generalized sensorimotor polyneuropathy, cervical radiculopathy or diffuse myopathy.   Labs 03/27/2013:  TSH 0.73, B12 847, ESR 14, vitamin D 37 Labs 05/14/2013:  CK 26, aldolase 26, copper 91, MG panel negative  CSF 06/22/2013: R0 W0 G61 P27  ACE 4, Lyme neg, MBP 61, IgG 1.6 OCB The patient's CSF contains >5 well defined gamma restriction bands that are also present in the patient's  corresponding serum sample, but some bands in the CSF are more prominent.  CSF cytology with few mononuclear cells CSF cytometry was not performed due to insufficient  sample.   IMPRESSION/PLAN: Relapsing-remitting multiple sclerosis, diagnosed May 2015.  Symptoms manifested with generalized fatigue and tremors.  MRI brain showed periventricular and juxtacortical white matter bilaterally.  He has been doing great on Copaxone since 10/2013 and has not had any relapses and there is no evidence of disease activity.  .   - MRI brain wwo contrast.  Premedicate with diazepam 27m 30 min prior to MRI  - Continue Copaxone 442mthree times weekly, if insurance denies brand, will need to switch to generic. He has many medication hypersensitivities so will need to be cautious with medication changes.  He had many questions regarding the formulation differences which I answered to the best of my ability.  We also discussed transitioning to oral disease modifying therapy (Tecfidera vs Gilenya) which he is not interested in due to side effects and frequent lab monitoring.   - Continue vitamin D 4000U daily   - He is up-to-date with annual eye exam  Return to clinic in 6 months  Greater than 50% of this 30 minute visit was spent in counseling, explanation of diagnosis, planning of further management, and coordination of care..   Thank you for allowing me to participate in patient's care.  If I can answer any additional questions, I would be pleased to do so.    Sincerely,    Carlesha Seiple K. PaPosey ProntoDO

## 2016-11-28 ENCOUNTER — Encounter: Payer: Self-pay | Admitting: Neurology

## 2016-11-29 ENCOUNTER — Telehealth: Payer: Self-pay | Admitting: Neurology

## 2016-11-29 NOTE — Telephone Encounter (Signed)
MRI head without contrast 11/28/2016:  Essentially stable findings of multiple sclerosis. No acute intracranial abnormality.  Please inform patient that his MRI brain is stable.  No new lesions.  [name] K. Allena Katz, DO

## 2016-11-30 ENCOUNTER — Encounter: Payer: Self-pay | Admitting: *Deleted

## 2016-11-30 NOTE — Telephone Encounter (Signed)
Results sent via My Chart.  

## 2017-01-01 ENCOUNTER — Other Ambulatory Visit: Payer: Self-pay | Admitting: Internal Medicine

## 2017-02-26 ENCOUNTER — Telehealth: Payer: Self-pay | Admitting: Internal Medicine

## 2017-02-26 ENCOUNTER — Encounter: Payer: Self-pay | Admitting: Internal Medicine

## 2017-02-26 ENCOUNTER — Ambulatory Visit (INDEPENDENT_AMBULATORY_CARE_PROVIDER_SITE_OTHER): Payer: BLUE CROSS/BLUE SHIELD | Admitting: Internal Medicine

## 2017-02-26 ENCOUNTER — Other Ambulatory Visit (INDEPENDENT_AMBULATORY_CARE_PROVIDER_SITE_OTHER): Payer: BLUE CROSS/BLUE SHIELD

## 2017-02-26 VITALS — BP 124/86 | HR 58 | Temp 98.0°F | Ht 67.5 in | Wt 194.0 lb

## 2017-02-26 DIAGNOSIS — I10 Essential (primary) hypertension: Secondary | ICD-10-CM

## 2017-02-26 DIAGNOSIS — K529 Noninfective gastroenteritis and colitis, unspecified: Secondary | ICD-10-CM

## 2017-02-26 DIAGNOSIS — R739 Hyperglycemia, unspecified: Secondary | ICD-10-CM | POA: Diagnosis not present

## 2017-02-26 LAB — BASIC METABOLIC PANEL
BUN: 13 mg/dL (ref 6–23)
CHLORIDE: 103 meq/L (ref 96–112)
CO2: 27 meq/L (ref 19–32)
Calcium: 9.6 mg/dL (ref 8.4–10.5)
Creatinine, Ser: 0.83 mg/dL (ref 0.40–1.50)
GFR: 105.2 mL/min (ref >60.00)
Glucose, Bld: 105 mg/dL — ABNORMAL HIGH (ref 70–99)
POTASSIUM: 3.8 meq/L (ref 3.5–5.1)
Sodium: 137 mEq/L (ref 135–145)

## 2017-02-26 LAB — CBC WITH DIFFERENTIAL/PLATELET
BASOS PCT: 0.4 % (ref 0.0–3.0)
Basophils Absolute: 0 10*3/uL (ref 0.0–0.1)
EOS ABS: 0.1 10*3/uL (ref 0.0–0.7)
Eosinophils Relative: 1.7 % (ref 0.0–5.0)
HEMATOCRIT: 42.8 % (ref 39.0–52.0)
Hemoglobin: 14.7 g/dL (ref 13.0–17.0)
LYMPHS ABS: 1.3 10*3/uL (ref 0.7–4.0)
Lymphocytes Relative: 28.9 % (ref 12.0–46.0)
MCHC: 34.2 g/dL (ref 30.0–36.0)
MCV: 89.6 fl (ref 78.0–100.0)
MONO ABS: 0.5 10*3/uL (ref 0.1–1.0)
Monocytes Relative: 10.9 % (ref 3.0–12.0)
NEUTROS ABS: 2.6 10*3/uL (ref 1.4–7.7)
NEUTROS PCT: 58.1 % (ref 43.0–77.0)
PLATELETS: 198 10*3/uL (ref 150.0–400.0)
RBC: 4.78 Mil/uL (ref 4.22–5.81)
RDW: 13.2 % (ref 11.5–15.5)
WBC: 4.5 10*3/uL (ref 4.0–10.5)

## 2017-02-26 LAB — HEPATIC FUNCTION PANEL
ALT: 85 U/L — ABNORMAL HIGH (ref 0–53)
AST: 42 U/L — ABNORMAL HIGH (ref 0–37)
Albumin: 3.8 g/dL (ref 3.5–5.2)
Alkaline Phosphatase: 86 U/L (ref 39–117)
Bilirubin, Direct: 0 mg/dL (ref 0.0–0.3)
TOTAL PROTEIN: 7 g/dL (ref 6.0–8.3)
Total Bilirubin: 0.4 mg/dL (ref 0.2–1.2)

## 2017-02-26 NOTE — Telephone Encounter (Signed)
Copied from CRM [id number]. Topic: Inquiry >> Feb 26, 2017  3:45 PM Everardo Pacific, [place] wrote: Reason for CRM: Patient would like to let Dr.Johns nurse know that some paper work will be getting faxed over to the office to be filled out from  [place]. If would like to speak to her as well. If she could give him a call back at [phone]

## 2017-02-26 NOTE — Patient Instructions (Signed)
We will fax the request for information to [facility]  Please continue all other medications as before, and refills have been done if requested.  Please have the pharmacy call with any other refills you may need.  Please continue your efforts at being more active, low cholesterol diet, and weight control.  Please keep your appointments with your specialists as you may have planned  OK for work note to cover Jan 18 until a return to work date of Jan 23

## 2017-02-26 NOTE — Progress Notes (Signed)
Subjective:    Patient ID: Russell Penner., male    DOB: 03/19/1970, 47 y.o.   MRN: [id number]  HPI  Here after episode of n/v/d without blood with significant dehydration for 5 days last wk, now improved overall after seen in [facility] ED for IVF's.  Labs there c/w mild leukocytosis, mild AKI improved on f/u lab same day, lactic acid > 4 but not felt to represent sepsis.  CT abd/pelvis c/w mild SB dilation but no other specific abnormality; gallstones present noted but no cholecystitis evidence.  Currently, Denies worsening reflux, abd pain, dysphagia, n/v, other bowel change or blood.  Pt denies chest pain, increased sob or doe, wheezing, orthopnea, PND, increased LE swelling, palpitations, dizziness or syncope.  No other new complaints  Has not yet been back to work, wants to restart tomorrow. Past Medical History:  Diagnosis Date  . ASTHMA, CHILDHOOD 04/18/2009   Qualifier: Diagnosis of  By: Jonny Ruiz MD, Len Blalock   . BIPOLAR DISORDER UNSPECIFIED 04/18/2009   Qualifier: Diagnosis of  By: Jonny Ruiz MD, Len Blalock   . GERD (gastroesophageal reflux disease) 07/30/2013  . Hypertension   . Insomnia   . Multiple sclerosis (HCC) 2015  . Psoriasis 12/15/2013  . Ventral hernia without obstruction or gangrene 12/15/2013   Past Surgical History:  Procedure Laterality Date  . HERNIA REPAIR      reports that  has never smoked. he has never used smokeless tobacco. He reports that he does not drink alcohol or use drugs. family history includes COPD in his sister; Diabetes in his father; Fibromyalgia in his sister; Healthy in his sister; Heart disease in his father; Hypertension in his father and mother; Stroke in his father. Allergies  Allergen Reactions  . Penicillins Itching and Rash    Little bumps   Current Outpatient Medications on File Prior to Visit  Medication Sig Dispense Refill  . acyclovir (ZOVIRAX) 800 MG tablet Take 800 mg by mouth 5 (five) times daily.    Marland Kitchen amLODipine (NORVASC) 5 MG tablet TAKE  ONE TABLET BY MOUTH ONCE DAILY 90 tablet 1  . aspirin EC 81 MG tablet Take 1 tablet (81 mg total) by mouth daily. 90 tablet 11  . diazepam (VALIUM) 5 MG tablet Take 1 tablet 30-min prior to MRI.  Ok to repeat at facility, if needed. 2 tablet 0  . Glatiramer Acetate (COPAXONE) 40 MG/ML SOSY INJECT 40 MG UNDER THE SKIN THREE TIMES A WEEK 12 Syringe 11  . irbesartan (AVAPRO) 300 MG tablet Take 1 tablet (300 mg total) by mouth daily. (Patient taking differently: Take 150 mg by mouth daily. ) 90 tablet 3  . meclizine (ANTIVERT) 12.5 MG tablet Take 1 tablet (12.5 mg total) by mouth 3 (three) times daily as needed for dizziness. 30 tablet 1  . predniSONE (DELTASONE) 10 MG tablet 3 tabs by mouth per day for 3 days,2tabs per day for 3 days,1tab per day for 3 days 18 tablet 0   No current facility-administered medications on file prior to visit.    Review of Systems  Constitutional: Negative for other unusual diaphoresis or sweats HENT: Negative for ear discharge or swelling Eyes: Negative for other worsening visual disturbances Respiratory: Negative for stridor or other swelling  Gastrointestinal: Negative for worsening distension or other blood Genitourinary: Negative for retention or other urinary change Musculoskeletal: Negative for other MSK pain or swelling Skin: Negative for color change or other new lesions Neurological: Negative for worsening tremors and other numbness  Psychiatric/Behavioral: Negative  for worsening agitation or other fatigue All other system neg per pt    Objective:   Physical Exam BP 124/86   Pulse (!) 58   Temp 98 F (36.7 C) (Oral)   Ht 5' 7.5" (1.715 m)   Wt 194 lb (88 kg)   SpO2 98%   BMI 29.94 kg/m  VS noted,  Constitutional: Pt appears in NAD HENT: Head: NCAT.  Right Ear: External ear normal.  Left Ear: External ear normal.  Eyes: . Pupils are equal, round, and reactive to light. Conjunctivae and EOM are normal Nose: without d/c or deformity Neck: Neck  supple. Gross normal ROM Cardiovascular: Normal rate and regular rhythm.   Pulmonary/Chest: Effort normal and breath sounds without rales or wheezing.  Abd:  Soft, NT, ND, + BS, no organomegaly Neurological: Pt is alert. At baseline orientation, motor grossly intact Skin: Skin is warm. No rashes, other new lesions, no LE edema Psychiatric: Pt behavior is normal without agitation  No other exam findings    Assessment & Plan:

## 2017-02-27 NOTE — Telephone Encounter (Signed)
[name] have you received anything for this patient? Are you aware of what it is about?

## 2017-02-27 NOTE — Assessment & Plan Note (Signed)
stable overall by history and exam, recent data reviewed with pt, and pt to continue medical treatment as before,  to f/u any worsening symptoms or concerns BP Readings from Last 3 Encounters:  02/26/17 124/86  11/15/16 (!) 144/80  11/08/16 138/86

## 2017-02-27 NOTE — Assessment & Plan Note (Signed)
Symptomatically resolved, exam benign, ok for work note to return tomorrow

## 2017-02-27 NOTE — Telephone Encounter (Signed)
He told me during his OV yesterday but I have not seen anything come yet.

## 2017-02-27 NOTE — Assessment & Plan Note (Signed)
Lab Results  Component Value Date   HGBA1C 6.1 05/16/2016  stable overall by history and exam, recent data reviewed with pt, and pt to continue medical treatment as before,  to f/u any worsening symptoms or concerns

## 2017-05-04 ENCOUNTER — Ambulatory Visit: Payer: BLUE CROSS/BLUE SHIELD | Admitting: Family Medicine

## 2017-05-04 ENCOUNTER — Telehealth: Payer: Self-pay | Admitting: *Deleted

## 2017-05-04 NOTE — Telephone Encounter (Signed)
Pt called to schedule appointment in the Saturday clinic for "burning on top of head and dizziness".  Pt has h/o MS.  Spoke to Dr. Rocky Crafts and per Dr. Doreene Burke given his history he would like for patient to go to the ED.  Called patient back and informed him of Dr. Gabriel Rainwater recommendation.  He verbalized understanding and will go to [facility] ED.  Appointment was cancelled.  Forwarded to Dr. Doreene Burke for review

## 2017-05-06 ENCOUNTER — Encounter: Payer: Self-pay | Admitting: Neurology

## 2017-05-14 ENCOUNTER — Other Ambulatory Visit: Payer: Self-pay | Admitting: Internal Medicine

## 2017-05-14 ENCOUNTER — Encounter: Payer: Self-pay | Admitting: Internal Medicine

## 2017-05-14 ENCOUNTER — Other Ambulatory Visit (INDEPENDENT_AMBULATORY_CARE_PROVIDER_SITE_OTHER): Payer: BLUE CROSS/BLUE SHIELD

## 2017-05-14 ENCOUNTER — Telehealth: Payer: Self-pay

## 2017-05-14 ENCOUNTER — Ambulatory Visit (INDEPENDENT_AMBULATORY_CARE_PROVIDER_SITE_OTHER): Payer: BLUE CROSS/BLUE SHIELD | Admitting: Internal Medicine

## 2017-05-14 VITALS — BP 128/78 | HR 77 | Temp 98.7°F | Ht 67.5 in | Wt 199.0 lb

## 2017-05-14 DIAGNOSIS — Z0000 Encounter for general adult medical examination without abnormal findings: Secondary | ICD-10-CM

## 2017-05-14 DIAGNOSIS — N529 Male erectile dysfunction, unspecified: Secondary | ICD-10-CM | POA: Diagnosis not present

## 2017-05-14 DIAGNOSIS — R739 Hyperglycemia, unspecified: Secondary | ICD-10-CM

## 2017-05-14 LAB — CBC WITH DIFFERENTIAL/PLATELET
Basophils Absolute: 0 K/uL (ref 0.0–0.1)
Basophils Relative: 0.5 % (ref 0.0–3.0)
Eosinophils Absolute: 0.1 K/uL (ref 0.0–0.7)
Eosinophils Relative: 1.3 % (ref 0.0–5.0)
HCT: 43.6 % (ref 39.0–52.0)
Hemoglobin: 15 g/dL (ref 13.0–17.0)
Lymphocytes Relative: 18.7 % (ref 12.0–46.0)
Lymphs Abs: 1.4 K/uL (ref 0.7–4.0)
MCHC: 34.3 g/dL (ref 30.0–36.0)
MCV: 89.1 fl (ref 78.0–100.0)
Monocytes Absolute: 0.7 K/uL (ref 0.1–1.0)
Monocytes Relative: 9.5 % (ref 3.0–12.0)
Neutro Abs: 5.2 K/uL (ref 1.4–7.7)
Neutrophils Relative %: 70 % (ref 43.0–77.0)
Platelets: 179 K/uL (ref 150.0–400.0)
RBC: 4.89 Mil/uL (ref 4.22–5.81)
RDW: 14.3 % (ref 11.5–15.5)
WBC: 7.4 K/uL (ref 4.0–10.5)

## 2017-05-14 LAB — URINALYSIS, ROUTINE W REFLEX MICROSCOPIC
Hgb urine dipstick: NEGATIVE
Leukocytes, UA: NEGATIVE
Nitrite: NEGATIVE
PH: 5.5 (ref 5.0–8.0)
RBC / HPF: NONE SEEN (ref 0–?)
Total Protein, Urine: NEGATIVE
Urine Glucose: NEGATIVE
Urobilinogen, UA: 0.2 (ref 0.0–1.0)

## 2017-05-14 LAB — TSH: TSH: 1.54 u[IU]/mL (ref 0.35–4.50)

## 2017-05-14 LAB — BASIC METABOLIC PANEL WITH GFR
BUN: 11 mg/dL (ref 6–23)
CO2: 28 meq/L (ref 19–32)
Calcium: 9.3 mg/dL (ref 8.4–10.5)
Chloride: 104 meq/L (ref 96–112)
Creatinine, Ser: 0.87 mg/dL (ref 0.40–1.50)
GFR: 99.55 mL/min (ref >60.00)
Glucose, Bld: 167 mg/dL — ABNORMAL HIGH (ref 70–99)
Potassium: 4.5 meq/L (ref 3.5–5.1)
Sodium: 139 meq/L (ref 135–145)

## 2017-05-14 LAB — HEMOGLOBIN A1C: HEMOGLOBIN A1C: 5.9 % (ref 4.6–6.5)

## 2017-05-14 LAB — LIPID PANEL
Cholesterol: 153 mg/dL (ref 0–200)
HDL: 44.2 mg/dL (ref >39.00)
LDL Cholesterol: 81 mg/dL (ref 0–99)
NonHDL: 108.8
Total CHOL/HDL Ratio: 3
Triglycerides: 138 mg/dL (ref 0.0–149.0)
VLDL: 27.6 mg/dL (ref 0.0–40.0)

## 2017-05-14 LAB — HEPATIC FUNCTION PANEL
ALT: 37 U/L (ref 0–53)
AST: 20 U/L (ref 0–37)
Albumin: 3.8 g/dL (ref 3.5–5.2)
Alkaline Phosphatase: 97 U/L (ref 39–117)
BILIRUBIN DIRECT: 0.1 mg/dL (ref 0.0–0.3)
BILIRUBIN TOTAL: 0.4 mg/dL (ref 0.2–1.2)
Total Protein: 6.4 g/dL (ref 6.0–8.3)

## 2017-05-14 LAB — PSA: PSA: 0.8 ng/mL (ref 0.10–4.00)

## 2017-05-14 LAB — TESTOSTERONE: Testosterone: 261.32 ng/dL — ABNORMAL LOW (ref 300.00–890.00)

## 2017-05-14 MED ORDER — TESTOSTERONE 50 MG/5GM (1%) TD GEL: 5.0000 g | Freq: Every day | TRANSDERMAL | 1 refills | Status: DC

## 2017-05-14 NOTE — Telephone Encounter (Addendum)
Spoke with pt via PEC connection, he has been informed of lab results and expressed understanding.

## 2017-05-14 NOTE — Telephone Encounter (Signed)
Called pt, LVM.   

## 2017-05-14 NOTE — Telephone Encounter (Signed)
-----   Message from Corwin Levins, MD sent at 05/14/2017 12:26 PM EDT ----- Left message on MyChart, pt to cont same tx except  The test results show that your current treatment is OK, except the testosterone level is mild low.  We can begin topical treatment called Testim if this is ok with your insurance, and then check your levels with the next labs.Lendell Caprice to please inform pt, I will do rx

## 2017-05-14 NOTE — Progress Notes (Signed)
Subjective:    Patient ID: Russell Penner., male    DOB: 1970-10-19, 47 y.o.   MRN: [id number]  HPI Here for wellness and f/u;  Overall doing ok;  Pt denies Chest pain, worsening SOB, DOE, wheezing, orthopnea, PND, worsening LE edema, palpitations, dizziness or syncope.  Pt denies neurological change such as new headache, facial or extremity weakness.  Pt denies polydipsia, polyuria, or low sugar symptoms. Pt states overall good compliance with treatment and medications, good tolerability, and has been trying to follow appropriate diet.  Pt denies worsening depressive symptoms, suicidal ideation or panic. No fever, night sweats, wt loss, loss of appetite, or other constitutional symptoms.  Pt states good ability with ADL's, has low fall risk, home safety reviewed and adequate, no other significant changes in hearing or vision, and only occasionally active with exercise.  Working at [employer] as Conservation officer, nature.  Declines Tdap.  Had recent MS exacerbation  No other interval hx or new complaint Past Medical History:  Diagnosis Date  . ASTHMA, CHILDHOOD 04/18/2009   Qualifier: Diagnosis of  By: Jonny Ruiz MD, Len Blalock   . BIPOLAR DISORDER UNSPECIFIED 04/18/2009   Qualifier: Diagnosis of  By: Jonny Ruiz MD, Len Blalock   . GERD (gastroesophageal reflux disease) 07/30/2013  . Hypertension   . Insomnia   . Multiple sclerosis (HCC) 2015  . Psoriasis 12/15/2013  . Ventral hernia without obstruction or gangrene 12/15/2013   Past Surgical History:  Procedure Laterality Date  . HERNIA REPAIR      reports that he has never smoked. He has never used smokeless tobacco. He reports that he does not drink alcohol or use drugs. family history includes COPD in his sister; Diabetes in his father; Fibromyalgia in his sister; Healthy in his sister; Heart disease in his father; Hypertension in his father and mother; Parkinson's disease in his father; Stroke in his father. Allergies  Allergen Reactions  . Penicillins Itching and Rash   Little bumps   Current Outpatient Medications on File Prior to Visit  Medication Sig Dispense Refill  . acyclovir (ZOVIRAX) 800 MG tablet Take 800 mg by mouth 5 (five) times daily.    Marland Kitchen amLODipine (NORVASC) 5 MG tablet TAKE ONE TABLET BY MOUTH ONCE DAILY 90 tablet 1  . aspirin EC 81 MG tablet Take 1 tablet (81 mg total) by mouth daily. 90 tablet 11  . diazepam (VALIUM) 5 MG tablet Take 1 tablet 30-min prior to MRI.  Ok to repeat at facility, if needed. 2 tablet 0  . Glatiramer Acetate (COPAXONE) 40 MG/ML SOSY INJECT 40 MG UNDER THE SKIN THREE TIMES A WEEK 12 Syringe 11  . irbesartan (AVAPRO) 300 MG tablet Take 1 tablet (300 mg total) by mouth daily. (Patient taking differently: Take 150 mg by mouth daily. ) 90 tablet 3  . meclizine (ANTIVERT) 12.5 MG tablet Take 1 tablet (12.5 mg total) by mouth 3 (three) times daily as needed for dizziness. 30 tablet 1   No current facility-administered medications on file prior to visit.    Review of Systems Constitutional: Negative for other unusual diaphoresis, sweats, appetite or weight changes HENT: Negative for other worsening hearing loss, ear pain, facial swelling, mouth sores or neck stiffness.   Eyes: Negative for other worsening pain, redness or other visual disturbance.  Respiratory: Negative for other stridor or swelling Cardiovascular: Negative for other palpitations or other chest pain  Gastrointestinal: Negative for worsening diarrhea or loose stools, blood in stool, distention or other pain Genitourinary: Negative for hematuria,  flank pain or other change in urine volume.  Musculoskeletal: Negative for myalgias or other joint swelling.  Skin: Negative for other color change, or other wound or worsening drainage.  Neurological: Negative for other syncope or numbness. Hematological: Negative for other adenopathy or swelling Psychiatric/Behavioral: Negative for hallucinations, other worsening agitation, SI, self-injury, or new decreased  concentration \\All  other system neg per pt    Objective:   Physical Exam BP 128/78   Pulse 77   Temp 98.7 F (37.1 C) (Oral)   Ht 5' 7.5" (1.715 m)   Wt 199 lb (90.3 kg)   SpO2 96%   BMI 30.71 kg/m  VS noted,  Constitutional: Pt is oriented to person, place, and time. Appears well-developed and well-nourished, in no significant distress and comfortable Head: Normocephalic and atraumatic  Eyes: Conjunctivae and EOM are normal. Pupils are equal, round, and reactive to light Right Ear: External ear normal without discharge Left Ear: External ear normal without discharge Nose: Nose without discharge or deformity Mouth/Throat: Oropharynx is without other ulcerations and moist  Neck: Normal range of motion. Neck supple. No JVD present. No tracheal deviation present or significant neck LA or mass Cardiovascular: Normal rate, regular rhythm, normal heart sounds and intact distal pulses.   Pulmonary/Chest: WOB normal and breath sounds without rales or wheezing  Abdominal: Soft. Bowel sounds are normal. NT. No HSM  Musculoskeletal: Normal range of motion. Exhibits no edema Lymphadenopathy: Has no other cervical adenopathy.  Neurological: Pt is alert and oriented to person, place, and time. Pt has normal reflexes. No cranial nerve deficit. O/w not done in detail Skin: Skin is warm and dry. No rash noted or new ulcerations Psychiatric:  Has normal mood and affect. Behavior is normal without agitation No other exam findings    Assessment & Plan:

## 2017-05-14 NOTE — Patient Instructions (Signed)

## 2017-05-15 NOTE — Assessment & Plan Note (Signed)

## 2017-05-15 NOTE — Assessment & Plan Note (Signed)
Lab Results  Component Value Date   HGBA1C 5.9 05/14/2017  stable overall by history and exam, recent data reviewed with pt, and pt to continue medical treatment as before,  to f/u any worsening symptoms or concerns

## 2017-05-15 NOTE — Assessment & Plan Note (Signed)
For testosterone level 

## 2017-05-17 ENCOUNTER — Encounter: Payer: Self-pay | Admitting: Neurology

## 2017-05-17 ENCOUNTER — Ambulatory Visit (INDEPENDENT_AMBULATORY_CARE_PROVIDER_SITE_OTHER): Payer: BLUE CROSS/BLUE SHIELD | Admitting: Neurology

## 2017-05-17 ENCOUNTER — Other Ambulatory Visit (INDEPENDENT_AMBULATORY_CARE_PROVIDER_SITE_OTHER): Payer: BLUE CROSS/BLUE SHIELD

## 2017-05-17 VITALS — BP 110/80 | HR 64 | Ht 67.5 in | Wt 200.4 lb

## 2017-05-17 DIAGNOSIS — G35 Multiple sclerosis: Secondary | ICD-10-CM

## 2017-05-17 DIAGNOSIS — R5383 Other fatigue: Secondary | ICD-10-CM | POA: Diagnosis not present

## 2017-05-17 LAB — VITAMIN B12: Vitamin B-12: 321 pg/mL (ref 211–911)

## 2017-05-17 NOTE — Patient Instructions (Addendum)
Check vitamin B12  Continue Copaxone  Return to clinic in 6 months

## 2017-05-17 NOTE — Progress Notes (Signed)
Follow-up Visit   Date: 05/17/17    [name] MRN: [id number] DOB: 06/17/1970   Interim History: Russell Moses. is a 47 y.o. right-handed Caucasian male with history of hypertension, bipolar disorder, asthma, herpetic zoster, and anxiety returning to the clinic for follow-up of relapsing remitting multiple sclerosis.  The patient was accompanied to the clinic by self.  History of present illness: In 2014, he developed a tingling sensation over his head and get "swimming headed", which lasted a few seconds and spontaneously resolved. Starting in February 2015, he developed tremors of the hands and bilateral leg weakness.  On March 5th 2015, he was at work and started shaking, lost his energy, felt as if his head was heavy, and was short of breath so called his mom who took him to [facility]. He has CT head and lab work was unrevealing. His MRI brain in April 2015 showed white matter changes concerning for demyelinating disease.  CSF testing was also consistent with MS.  He was treated with 3-days of IVMP in May 2015 which significantly improved symptoms.  He started copaxone on 10/2013 and has been tolerating it well, besides injection site lipodystrophy.  Annual surveillance imaging has shown stable disease burden and he has not had any relapses since initial diagnosis in Spring 2015.  UPDATE 11/15/2016:  He is here for 6 month appointment.  He has been doing well with no new neurological concerns.  He is doing well on Copaxone, but now that it is available in generic, we are having difficulty trying to get it approved. He has many questions about the compound make-up of generic vs. brand formulation.  He has been having issues with sinusitis and is taking a prednisone taper.  He had eye exam earlier this year which showed mild changes in acuity.   UPDATE 05/17/2017:   In early April, he develops fatigue and burning sensation of the scalp, which was followed by high anxiety. He did  not have any weakness, vision changes, or numbness/tingling.  He went to the ER and given prednisone course for possible MS exacerbation.  He has been doing much better.  He endorses being anxious during this time and has several episodes like this in the past.   Medications:  Current Outpatient Medications on File Prior to Visit  Medication Sig Dispense Refill  . acyclovir (ZOVIRAX) 800 MG tablet Take 800 mg by mouth 5 (five) times daily.    Marland Kitchen amLODipine (NORVASC) 5 MG tablet TAKE ONE TABLET BY MOUTH ONCE DAILY 90 tablet 1  . aspirin EC 81 MG tablet Take 1 tablet (81 mg total) by mouth daily. 90 tablet 11  . diazepam (VALIUM) 5 MG tablet Take 1 tablet 30-min prior to MRI.  Ok to repeat at facility, if needed. 2 tablet 0  . Glatiramer Acetate (COPAXONE) 40 MG/ML SOSY INJECT 40 MG UNDER THE SKIN THREE TIMES A WEEK 12 Syringe 11  . irbesartan (AVAPRO) 300 MG tablet Take 1 tablet (300 mg total) by mouth daily. (Patient taking differently: Take 150 mg by mouth daily. ) 90 tablet 3  . meclizine (ANTIVERT) 12.5 MG tablet Take 1 tablet (12.5 mg total) by mouth 3 (three) times daily as needed for dizziness. 30 tablet 1  . testosterone (ANDROGEL) 50 MG/5GM (1%) GEL Place 5 g onto the skin daily. 450 g 1   No current facility-administered medications on file prior to visit.     Allergies:  Allergies  Allergen Reactions  . Penicillins  Itching and Rash    Little bumps     Review of Systems:  CONSTITUTIONAL: No fevers, chills, night sweats, or weight loss.   EYES: No visual changes or eye pain ENT: No hearing changes.  No history of nose bleeds.   RESPIRATORY: No cough, wheezing and shortness of breath.   CARDIOVASCULAR: Negative for chest pain, and palpitations.   GI: Negative for abdominal discomfort, blood in stools or black stools.  No recent change in bowel habits.   GU:  No history of incontinence.   MUSCLOSKELETAL: No history of joint pain or swelling.  No myalgias.   SKIN: Negative for  lesions, + rash, and itching.   ENDOCRINE: Negative for cold or heat intolerance, polydipsia or goiter.   PSYCH: No depression or anxiety symptoms.   NEURO: As Above.   Vital Signs:  BP 110/80   Pulse 64   Ht 5' 7.5" (1.715 m)   Wt 200 lb 6 oz (90.9 kg)   SpO2 97%   BMI 30.92 kg/m   General Medical Exam:   General:  Well appearing, comfortable  Neck:  Full range of motion without tenderness.  No carotid bruits. Respiratory:  Clear to auscultation, good air entry bilaterally.   Cardiac:  Regular rate and rhythm, no murmur.   Ext:  No edema  Neurological Exam: MENTAL STATUS including orientation to time, place, person, recent and remote memory, attention span and concentration, language, and fund of knowledge is normal.  Speech is not dysarthric.  CRANIAL NERVES: No visual field defects. Pupils equal round and reactive to light.  Normal conjugate, extra-ocular eye movements in all directions of gaze.  No ptosis. Normal facial sensation.  Face is symmetric. Palate elevates symmetrically.  Tongue is midline.  MOTOR:  Motor strength is 5/5 in all extremities.  No pronator drift.  Tone is normal.    MSRs:  Reflexes are 2+/4 throughout.  SENSORY:  Intact to vibration.  COORDINATION/GAIT:  Normal finger-to- nose-finger.  Intact rapid alternating movements bilaterally.  Gait narrow based and stable.    Data: MRI brain wwo contrast 11/28/2016:   Essentially stable findings of multiple sclerosis. No acute intracranial abnormality.  MRI brain wwo contrast 11/17/2015:  No significant changes since 12/16/2014.  No abnormal enhancement.  MRI brain wwo contrast 12/16/2014: 1. Unchanged supratentorial white matter lesions. No definite new lesions. No abnormal enhancement or restricted diffusion. 2. 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 3. Paransal sinus inflammatory change, correlate clinically.  MRI  brain wwo contrast 12/25/2013: Multiple lesoins in the periventricular and juxtacortical white matter regions bilaterally. There is no longer enhancement or diffusion abnormality. No new lesions MRI cervical spine wwo contrast: Normal  MRI brain wwo contrast 05/28/2013:  Multiple lesions in the periventricular and juxtacortical white matter bilaterally, one of which enhances.  The pattern and distribution of these lesions suggest demyelinating disease such as multiple sclerosis.  MRI cervical spine wwo contrast 06/04/2013:  Normal MRI cervical spine   EMG right upper and lower extremity 05/14/2013:  There is electrophysiological evidence of an intraspinal canal lesion (i.e. radiculopathy) affecting the L5 nerve root/segment bilaterally. Overall, these changes are mild in degree electrically and worse on the right side. There is no evidence of a generalized sensorimotor polyneuropathy, cervical radiculopathy or diffuse myopathy.   Labs 03/27/2013:  TSH 0.73, B12 847, ESR 14, vitamin D 37 Labs 05/14/2013:  CK 26, aldolase 26, copper 91, MG panel negative  CSF 06/22/2013: R0 W0 G61 P27  ACE 4, Lyme neg, MBP 61, IgG 1.6 OCB The patient's CSF contains >5 well defined gamma restriction bands that are also present in the patient's  corresponding serum sample, but some bands in the CSF are more prominent.  CSF cytology with few mononuclear cells  Lab Results  Component Value Date   TSH 1.54 05/14/2017    IMPRESSION/PLAN: Relapsing-remitting multiple sclerosis, diagnosed May 2015.  Symptoms manifested with generalized fatigue and tremors.  MRI brain showed periventricular and juxtacortical white matter bilaterally, which has been stable on his serial imaging.  He has been doing great on Copaxone since 10/2013. Exam is normal and non-focal. I am not sure that his last episode in April 2019 was a true exacerbation, as he endorses high anxiety and I question the possibility of panic attack.    - Continue  Copaxone 71m three times weekly, which he has been stable on  - Continue vitamin D 4000U daily  - He will be having annual eye exam next month  Spells of fatigue  - Check vitamin B12 level  - TSH is normal  Return to clinic in 6 months  Thank you for allowing me to participate in patient's care.  If I can answer any additional questions, I would be pleased to do so.    Sincerely,    [place] Monds K. PPosey Pronto DO

## 2017-05-21 ENCOUNTER — Telehealth: Payer: Self-pay | Admitting: Internal Medicine

## 2017-05-21 MED ORDER — TELMISARTAN 80 MG PO TABS: 80.0000 mg | ORAL_TABLET | Freq: Every day | ORAL | 3 refills | Status: DC

## 2017-05-21 NOTE — Telephone Encounter (Signed)
Please advise 

## 2017-05-21 NOTE — Telephone Encounter (Signed)
Copied from CRM [id number]. Topic: General - Other >> May 21, 2017  9:33 AM Cecelia Byars, RMA wrote: Reason for CRM: patient is requesting a call back concerning side affects of medication irbesartan (AVAPRO) 300 MG tablet , please return call he has questions about medication

## 2017-05-21 NOTE — Telephone Encounter (Signed)
I think the pt is referring to a recall of the irbesartan  We can change to micardis 80 mg as this is similar, but has not been recalled.

## 2017-05-21 NOTE — Telephone Encounter (Signed)
Called pt, LVM.   

## 2017-05-21 NOTE — Telephone Encounter (Signed)
Patient returning a call to [name]. States he was wanting to know if there was anything that could be prescribed to "counter act" the side effects. He states he takes his medication every morning around 5:15am. States he is fine for the first 2 hours and then after that he gets weak, feels like his energy level is low, and gets dizzy. He says he stays standing and does not faint. States that around 1 or 2pm he is back feeling okay.  Patient states he will be at work at 7am in the morning, but if [name] calls back tomorrow and he does not answer, she may leave a detailed message on his voicemail and he will call her back on his first break.

## 2017-05-21 NOTE — Addendum Note (Signed)
Addended by: Corwin Levins on: 05/21/2017 12:43 PM   Modules accepted: Orders

## 2017-05-22 NOTE — Telephone Encounter (Signed)
Called pt, LVM as instructed with details below.

## 2017-05-22 NOTE — Telephone Encounter (Signed)
Ok to take Half the micardis prescribed as this should help

## 2017-06-03 NOTE — Telephone Encounter (Signed)
Patient says he's concerned because he has been experiencing dizziness and weakness. Would like to know what Dr. Jonny Ruiz recommends.

## 2017-06-03 NOTE — Telephone Encounter (Signed)
Standing usually leads to lower BP, so I am not sure about why the cause  OK to continue to monitor, but call in 1 weeks if still mild elevated

## 2017-06-03 NOTE — Telephone Encounter (Addendum)
Pt called back to advise his bp has started going up in the afternoons..  This afternoon it was 147/84 . Yesterday it was 157/90 after church. In the afternoon ,After the amlodipine, it goes down. Pt states it seems to go up in the afternoon while he is at work on his feet.  Pt states he is on telmisartan (MICARDIS) 80 MG tablet as well.  Pt wants to know if anything he can do to keep it from going up in the afternoon. Pt wants a call back as soon as possible.

## 2017-06-04 NOTE — Telephone Encounter (Signed)
Called pt, LVM with PCP instructions below.

## 2017-06-20 DIAGNOSIS — R112 Nausea with vomiting, unspecified: Secondary | ICD-10-CM | POA: Diagnosis not present

## 2017-06-20 DIAGNOSIS — Z8669 Personal history of other diseases of the nervous system and sense organs: Secondary | ICD-10-CM | POA: Diagnosis not present

## 2017-06-20 DIAGNOSIS — R109 Unspecified abdominal pain: Secondary | ICD-10-CM | POA: Diagnosis not present

## 2017-06-20 DIAGNOSIS — K529 Noninfective gastroenteritis and colitis, unspecified: Secondary | ICD-10-CM | POA: Diagnosis not present

## 2017-06-21 DIAGNOSIS — Z8669 Personal history of other diseases of the nervous system and sense organs: Secondary | ICD-10-CM | POA: Diagnosis not present

## 2017-06-21 DIAGNOSIS — R109 Unspecified abdominal pain: Secondary | ICD-10-CM | POA: Diagnosis not present

## 2017-06-21 DIAGNOSIS — K529 Noninfective gastroenteritis and colitis, unspecified: Secondary | ICD-10-CM | POA: Diagnosis not present

## 2017-06-21 DIAGNOSIS — R112 Nausea with vomiting, unspecified: Secondary | ICD-10-CM | POA: Diagnosis not present

## 2017-06-22 DIAGNOSIS — K529 Noninfective gastroenteritis and colitis, unspecified: Secondary | ICD-10-CM | POA: Diagnosis not present

## 2017-06-22 DIAGNOSIS — Z8669 Personal history of other diseases of the nervous system and sense organs: Secondary | ICD-10-CM | POA: Diagnosis not present

## 2017-06-22 DIAGNOSIS — R112 Nausea with vomiting, unspecified: Secondary | ICD-10-CM | POA: Diagnosis not present

## 2017-06-22 DIAGNOSIS — R109 Unspecified abdominal pain: Secondary | ICD-10-CM | POA: Diagnosis not present

## 2017-07-04 ENCOUNTER — Ambulatory Visit (INDEPENDENT_AMBULATORY_CARE_PROVIDER_SITE_OTHER): Payer: BLUE CROSS/BLUE SHIELD | Admitting: Internal Medicine

## 2017-07-04 ENCOUNTER — Encounter: Payer: Self-pay | Admitting: Internal Medicine

## 2017-07-04 VITALS — BP 120/76 | HR 64 | Temp 98.5°F | Ht 67.5 in | Wt 200.0 lb

## 2017-07-04 DIAGNOSIS — I10 Essential (primary) hypertension: Secondary | ICD-10-CM | POA: Diagnosis not present

## 2017-07-04 DIAGNOSIS — K529 Noninfective gastroenteritis and colitis, unspecified: Secondary | ICD-10-CM | POA: Diagnosis not present

## 2017-07-04 DIAGNOSIS — G35 Multiple sclerosis: Secondary | ICD-10-CM

## 2017-07-04 MED ORDER — AMLODIPINE BESYLATE 5 MG PO TABS: 5.0000 mg | ORAL_TABLET | Freq: Every day | ORAL | 3 refills | Status: DC

## 2017-07-04 NOTE — Progress Notes (Signed)
Subjective:    Patient ID: Russell Penner., male    DOB: 29-Jan-1971, 47 y.o.   MRN: [id number]  HPI  Here for post hospn f/u after hospn at [facility] in [place] 5/16 -5/18 for gastroenteritis/colitis, tx with IVFs, and IV antibiotics, then d/c with 1 wk of levaquin, flagyl, and also 15 days of protonix, and also OTC probiotic. CT done, blood culture neg and stool cx neg. Denies worsening reflux, abd pain, dysphagia, n/v, furhter bowel change or blood.  Pt denies chest pain, increased sob or doe, wheezing, orthopnea, PND, increased LE swelling, palpitations, dizziness or syncope.  Pt denies new neurological symptoms such as new headache, or facial or extremity weakness or numbness, has seen neuro recently , normal b12. Past Medical History:  Diagnosis Date  . ASTHMA, CHILDHOOD 04/18/2009   Qualifier: Diagnosis of  By: Jonny Ruiz MD, Len Blalock   . BIPOLAR DISORDER UNSPECIFIED 04/18/2009   Qualifier: Diagnosis of  By: Jonny Ruiz MD, Len Blalock   . GERD (gastroesophageal reflux disease) 07/30/2013  . Hypertension   . Insomnia   . Multiple sclerosis (HCC) 2015  . Psoriasis 12/15/2013  . Ventral hernia without obstruction or gangrene 12/15/2013   Past Surgical History:  Procedure Laterality Date  . HERNIA REPAIR      reports that he has never smoked. He has never used smokeless tobacco. He reports that he does not drink alcohol or use drugs. family history includes COPD in his sister; Diabetes in his father; Fibromyalgia in his sister; Healthy in his sister; Heart disease in his father; Hypertension in his father and mother; Parkinson's disease in his father; Stroke in his father. Allergies  Allergen Reactions  . Penicillins Itching and Rash    Little bumps   Current Outpatient Medications on File Prior to Visit  Medication Sig Dispense Refill  . acyclovir (ZOVIRAX) 800 MG tablet Take 800 mg by mouth 5 (five) times daily.    Marland Kitchen amLODipine (NORVASC) 5 MG tablet TAKE ONE TABLET BY MOUTH ONCE DAILY 90  tablet 1  . aspirin EC 81 MG tablet Take 1 tablet (81 mg total) by mouth daily. 90 tablet 11  . diazepam (VALIUM) 5 MG tablet Take 1 tablet 30-min prior to MRI.  Ok to repeat at facility, if needed. 2 tablet 0  . Glatiramer Acetate (COPAXONE) 40 MG/ML SOSY INJECT 40 MG UNDER THE SKIN THREE TIMES A WEEK 12 Syringe 11  . meclizine (ANTIVERT) 12.5 MG tablet Take 1 tablet (12.5 mg total) by mouth 3 (three) times daily as needed for dizziness. 30 tablet 1  . telmisartan (MICARDIS) 80 MG tablet Take 1 tablet (80 mg total) by mouth daily. 90 tablet 3  . testosterone (ANDROGEL) 50 MG/5GM (1%) GEL Place 5 g onto the skin daily. 450 g 1   No current facility-administered medications on file prior to visit.    Review of Systems  Constitutional: Negative for other unusual diaphoresis or sweats HENT: Negative for ear discharge or swelling Eyes: Negative for other worsening visual disturbances Respiratory: Negative for stridor or other swelling  Gastrointestinal: Negative for worsening distension or other blood Genitourinary: Negative for retention or other urinary change Musculoskeletal: Negative for other MSK pain or swelling Skin: Negative for color change or other new lesions Neurological: Negative for worsening tremors and other numbness  Psychiatric/Behavioral: Negative for worsening agitation or other fatigue All other system neg per pt    Objective:   Physical Exam BP 120/76   Pulse 64   Temp 98.5  F (36.9 C) (Oral)   Ht 5' 7.5" (1.715 m)   Wt 200 lb (90.7 kg)   SpO2 95%   BMI 30.86 kg/m  VS noted,  Constitutional: Pt appears in NAD HENT: Head: NCAT.  Right Ear: External ear normal.  Left Ear: External ear normal.  Eyes: . Pupils are equal, round, and reactive to light. Conjunctivae and EOM are normal Nose: without d/c or deformity Neck: Neck supple. Gross normal ROM Cardiovascular: Normal rate and regular rhythm.   Pulmonary/Chest: Effort normal and breath sounds without rales  or wheezing.  Abd:  Soft, NT, ND, + BS, no organomegaly Neurological: Pt is alert. At baseline orientation, motor grossly intact Skin: Skin is warm. No rashes, other new lesions, no LE edema Psychiatric: Pt behavior is normal without agitation  No other exam findings Lab Results  Component Value Date   WBC 7.4 05/14/2017   HGB 15.0 05/14/2017   HCT 43.6 05/14/2017   PLT 179.0 05/14/2017   GLUCOSE 167 (H) 05/14/2017   CHOL 153 05/14/2017   TRIG 138.0 05/14/2017   HDL 44.20 05/14/2017   LDLCALC 81 05/14/2017   ALT 37 05/14/2017   AST 20 05/14/2017   NA 139 05/14/2017   K 4.5 05/14/2017   CL 104 05/14/2017   CREATININE 0.87 05/14/2017   BUN 11 05/14/2017   CO2 28 05/14/2017   TSH 1.54 05/14/2017   PSA 0.80 05/14/2017   HGBA1C 5.9 05/14/2017      Assessment & Plan:

## 2017-07-04 NOTE — Patient Instructions (Addendum)
Please continue all other medications as before, and refills have been done if requested.  Please have the pharmacy call with any other refills you may need.  Please continue your efforts at being more active, low cholesterol diet, and weight control - ok to increase your diet back to the previous  Please keep your appointments with your specialists as you may have planned

## 2017-07-04 NOTE — Assessment & Plan Note (Signed)
stable overall by history and exam, recent data reviewed with pt, and pt to continue medical treatment as before,  to f/u any worsening symptoms or concerns BP Readings from Last 3 Encounters:  07/04/17 120/76  05/17/17 110/80  05/14/17 128/78

## 2017-07-04 NOTE — Assessment & Plan Note (Signed)
Etiology unclear, finished antibx tx, asympt, ok to follow, doubt possible crohns as was suggested by CT

## 2017-07-04 NOTE — Assessment & Plan Note (Signed)
stable overall by history and exam, recent data reviewed with pt, and pt to continue medical treatment as before,  to f/u any worsening symptoms or concerns  

## 2017-09-02 ENCOUNTER — Encounter: Payer: Self-pay | Admitting: Family

## 2017-09-02 ENCOUNTER — Ambulatory Visit (INDEPENDENT_AMBULATORY_CARE_PROVIDER_SITE_OTHER): Payer: BLUE CROSS/BLUE SHIELD | Admitting: Family

## 2017-09-02 VITALS — BP 130/80 | HR 67 | Temp 98.2°F | Ht 67.5 in | Wt 202.0 lb

## 2017-09-02 DIAGNOSIS — R21 Rash and other nonspecific skin eruption: Secondary | ICD-10-CM

## 2017-09-02 MED ORDER — RANITIDINE HCL 150 MG PO TABS: 150.0000 mg | ORAL_TABLET | Freq: Two times a day (BID) | ORAL | 0 refills | Status: DC

## 2017-09-02 MED ORDER — METHYLPREDNISOLONE ACETATE 40 MG/ML IJ SUSP
40.0000 mg | Freq: Once | INTRAMUSCULAR | Status: AC
Start: 2017-09-02 — End: 2017-09-02
  Administered 2017-09-02: 40 mg via INTRAMUSCULAR

## 2017-09-02 MED ORDER — CETIRIZINE HCL 10 MG PO TABS: 10.0000 mg | ORAL_TABLET | Freq: Every day | ORAL | 0 refills | Status: DC

## 2017-09-02 MED ORDER — METHYLPREDNISOLONE 4 MG PO TBPK: ORAL_TABLET | ORAL | 0 refills | Status: DC

## 2017-09-02 NOTE — Progress Notes (Signed)
Russell Moses. is a 47 y.o. male with the following history as recorded in EpicCare:  Patient Active Problem List   Diagnosis Date Noted  . Gastroenteritis 02/26/2017  . Hyperglycemia 12/16/2015  . Erectile dysfunction 06/15/2015  . Hyperlipidemia 06/15/2015  . Obesity 06/15/2015  . Allergic rhinitis 12/16/2014  . Psoriasis 12/15/2013  . Ventral hernia without obstruction or gangrene 12/15/2013  . Multiple sclerosis (HCC) 10/27/2013  . GERD (gastroesophageal reflux disease) 07/30/2013  . Lumbosacral radiculopathy at L5 05/14/2013  . Other malaise and fatigue 03/26/2013  . Anxiety state 03/04/2013  . Hypertension 02/25/2013  . Preventative health care 02/21/2012  . BIPOLAR DISORDER UNSPECIFIED 04/18/2009  . ASTHMA, CHILDHOOD 04/18/2009  . INSOMNIA 04/18/2009    Current Outpatient Medications  Medication Sig Dispense Refill  . acyclovir (ZOVIRAX) 800 MG tablet Take 800 mg by mouth 5 (five) times daily.    Marland Kitchen amLODipine (NORVASC) 5 MG tablet Take 1 tablet (5 mg total) by mouth daily. 90 tablet 3  . aspirin EC 81 MG tablet Take 1 tablet (81 mg total) by mouth daily. 90 tablet 11  . diazepam (VALIUM) 5 MG tablet Take 1 tablet 30-min prior to MRI.  Ok to repeat at facility, if needed. 2 tablet 0  . Glatiramer Acetate (COPAXONE) 40 MG/ML SOSY INJECT 40 MG UNDER THE SKIN THREE TIMES A WEEK 12 Syringe 11  . meclizine (ANTIVERT) 12.5 MG tablet Take 1 tablet (12.5 mg total) by mouth 3 (three) times daily as needed for dizziness. 30 tablet 1  . telmisartan (MICARDIS) 80 MG tablet Take 1 tablet (80 mg total) by mouth daily. 90 tablet 3  . testosterone (ANDROGEL) 50 MG/5GM (1%) GEL Place 5 g onto the skin daily. 450 g 1  . triamcinolone cream (KENALOG) 0.1 % APPLY CREAM EXTERNALLY TO AFFECTED AREA 4 TIMES DAILY FOR 10 DAYS AS NEEDED FOR RASH ITCHING  0  . cetirizine (ZYRTEC) 10 MG tablet Take 1 tablet (10 mg total) by mouth daily. 30 tablet 0  . methylPREDNISolone (MEDROL DOSEPAK) 4 MG TBPK  tablet Take as directed 21 tablet 0  . ranitidine (ZANTAC) 150 MG tablet Take 1 tablet (150 mg total) by mouth 2 (two) times daily. 30 tablet 0   No current facility-administered medications for this visit.     Allergies: Penicillins  Past Medical History:  Diagnosis Date  . ASTHMA, CHILDHOOD 04/18/2009   Qualifier: Diagnosis of  By: Jonny Ruiz MD, Len Blalock   . BIPOLAR DISORDER UNSPECIFIED 04/18/2009   Qualifier: Diagnosis of  By: Jonny Ruiz MD, Len Blalock   . GERD (gastroesophageal reflux disease) 07/30/2013  . Hypertension   . Insomnia   . Multiple sclerosis (HCC) 2015  . Psoriasis 12/15/2013  . Ventral hernia without obstruction or gangrene 12/15/2013    Past Surgical History:  Procedure Laterality Date  . HERNIA REPAIR      Family History  Problem Relation Age of Onset  . Hypertension Mother   . Stroke Father   . Diabetes Father   . Hypertension Father   . Heart disease Father   . Parkinson's disease Father   . COPD Sister   . Fibromyalgia Sister   . Healthy Sister     Social History   Tobacco Use  . Smoking status: Never Smoker  . Smokeless tobacco: Never Used  Substance Use Topics  . Alcohol use: No    Alcohol/week: 0.0 oz    Subjective:  1 month history of "all over body rash"; was seen at [facility] in  early July and treated with oral prednisone/ triamcinolone; did not respond to the prednisone- symptoms have persisted;  Of note, started Telmisartan end of April/ early May but had previously taken ARB with no concerns; was hospitalized in May with GI symptoms- started on OTC probiotic at the end of May/ early June. Did stay on the OTC probiotic during recent course of prednisone; no shortness of breath/ no difficulty breathing  Objective:  Vitals:   09/02/17 1455  BP: 130/80  Pulse: 67  Temp: 98.2 F (36.8 C)  TempSrc: Oral  SpO2: 96%  Weight: 202 lb (91.6 kg)  Height: 5' 7.5" (1.715 m)    General: Well developed, well nourished, in no acute distress  Skin : Warm and dry.  Macular/ papular rash on extremities/ trunk; erythema noted on patient's back Head: Normocephalic and atraumatic  Eyes: Sclera and conjunctiva clear; pupils round and reactive to light; extraocular movements intact  Lungs: Respirations unlabored;  Extremities: No edema, cyanosis, clubbing  Vessels: Symmetric bilaterally  Neurologic: Alert and oriented; speech intact; face symmetrical; moves all extremities well; CNII-XII intact without focal deficit   Assessment:  1. Rash     Plan:  ? Reaction to OTC probiotic; this is most recent medication that patient started and symptoms do coincide with starting this medication; stop supplement immediately; will try treating with one more round of prednisone/ Zyrtec and Zantac; if symptoms persist, need to consider Telmisartan as source of symptoms and/or seeing dermatology.   No follow-ups on file.  No orders of the defined types were placed in this encounter.   Requested Prescriptions   Signed Prescriptions Disp Refills  . ranitidine (ZANTAC) 150 MG tablet 30 tablet 0    Sig: Take 1 tablet (150 mg total) by mouth 2 (two) times daily.  . cetirizine (ZYRTEC) 10 MG tablet 30 tablet 0    Sig: Take 1 tablet (10 mg total) by mouth daily.  . methylPREDNISolone (MEDROL DOSEPAK) 4 MG TBPK tablet 21 tablet 0    Sig: Take as directed

## 2017-09-16 ENCOUNTER — Telehealth: Payer: Self-pay | Admitting: Family

## 2017-09-16 DIAGNOSIS — R21 Rash and other nonspecific skin eruption: Secondary | ICD-10-CM

## 2017-09-16 NOTE — Telephone Encounter (Signed)
Ok to use the topical steroid he has, or I can refer to derm if he wants

## 2017-09-16 NOTE — Telephone Encounter (Signed)
Copied from CRM [id number]. Topic: Quick Communication - See Telephone Encounter >> Sep 16, 2017  4:33 PM Raquel Sarna wrote: July 29 - was given a shot and prednizone. Spots are just starting to come back and itching. Pt wanting a call back to know what to try next.

## 2017-09-17 MED ORDER — TRIAMCINOLONE ACETONIDE 0.1 % EX CREA: TOPICAL_CREAM | CUTANEOUS | 1 refills | Status: DC

## 2017-09-17 NOTE — Addendum Note (Signed)
Addended by: Roney Mans on: 09/17/2017 09:11 AM   Modules accepted: Orders

## 2017-09-17 NOTE — Telephone Encounter (Signed)
Pt will use the cream prescribed previously but states he is almost out. Please send in a refill for him on triamcinolone cream (KENALOG) 0.1 %.   [facility] [address], [place] - [address] [phone] (Phone) [phone] (Fax)   Pt states he would like referral to dermatology also. He said that Ria Clock, NP had mentioned a dermatologist in [place] before that would be near his home. Please advise at [phone].

## 2017-09-17 NOTE — Telephone Encounter (Signed)
Called pt, LVM.   

## 2017-09-18 NOTE — Addendum Note (Signed)
Addended by: Eustace Moore on: 09/18/2017 04:07 PM   Modules accepted: Orders

## 2017-09-18 NOTE — Telephone Encounter (Signed)
Yes, I am happy to put in a referral for him. I am sorry I didn't know he needed a referral before now.

## 2017-09-18 NOTE — Telephone Encounter (Signed)
Patient is calling to see if Ria Clock, NP is still going to place a referral for a dermatologist for him. Please advise. Call back # [phone]

## 2017-09-18 NOTE — Telephone Encounter (Signed)
Spoke with patient and info given 

## 2017-09-26 ENCOUNTER — Ambulatory Visit (INDEPENDENT_AMBULATORY_CARE_PROVIDER_SITE_OTHER): Payer: BLUE CROSS/BLUE SHIELD | Admitting: Internal Medicine

## 2017-09-26 ENCOUNTER — Encounter: Payer: Self-pay | Admitting: Internal Medicine

## 2017-09-26 DIAGNOSIS — R739 Hyperglycemia, unspecified: Secondary | ICD-10-CM | POA: Diagnosis not present

## 2017-09-26 DIAGNOSIS — M25561 Pain in right knee: Secondary | ICD-10-CM | POA: Diagnosis not present

## 2017-09-26 DIAGNOSIS — I10 Essential (primary) hypertension: Secondary | ICD-10-CM

## 2017-09-26 MED ORDER — TRAMADOL HCL 50 MG PO TABS: 50.0000 mg | ORAL_TABLET | Freq: Three times a day (TID) | ORAL | 0 refills | Status: DC | PRN

## 2017-09-26 NOTE — Assessment & Plan Note (Signed)
stable overall by history and exam, recent data reviewed with pt, and pt to continue medical treatment as before,  to f/u any worsening symptoms or concerns BP Readings from Last 3 Encounters:  09/26/17 128/72  09/02/17 130/80  07/04/17 120/76

## 2017-09-26 NOTE — Progress Notes (Signed)
Subjective:    Patient ID: Russell Moses., male    DOB: [birth year], 48 y.o.   MRN: [id number]  HPI  Here with c/o medial right knee pain x 2 mo, moderate, constant, sharp shooting and also heavy like, assoc with swelling, limps to walk, worse pain to walk, better to sit, not really unstable but does seem "loose'  But no giveaways, no falls.  No trauma. No fever, no known personal hx of gout, grandmother did years ago. Psoriasis not really worseing and has not had psoriatic arthritis in past, no other joint bothering him, but does have heavy feeling to legs like he just ran a mild but is only standing to walk. Pt denies chest pain, increased sob or doe, wheezing, orthopnea, PND, increased LE swelling, palpitations, dizziness or syncope.   Pt denies polydipsia, polyuria,    Past Medical History:  Diagnosis Date  . ASTHMA, CHILDHOOD 04/18/2009   Qualifier: Diagnosis of  By: Jonny Ruiz MD, Len Blalock   . BIPOLAR DISORDER UNSPECIFIED 04/18/2009   Qualifier: Diagnosis of  By: Jonny Ruiz MD, Len Blalock   . GERD (gastroesophageal reflux disease) 07/30/2013  . Hypertension   . Insomnia   . Multiple sclerosis (HCC) 2015  . Psoriasis 12/15/2013  . Ventral hernia without obstruction or gangrene 12/15/2013   Past Surgical History:  Procedure Laterality Date  . HERNIA REPAIR      reports that he has never smoked. He has never used smokeless tobacco. He reports that he does not drink alcohol or use drugs. family history includes COPD in his sister; Diabetes in his father; Fibromyalgia in his sister; Healthy in his sister; Heart disease in his father; Hypertension in his father and mother; Parkinson's disease in his father; Stroke in his father. Allergies  Allergen Reactions  . Penicillins Itching and Rash    Little bumps   Current Outpatient Medications on File Prior to Visit  Medication Sig Dispense Refill  . acyclovir (ZOVIRAX) 800 MG tablet Take 800 mg by mouth 5 (five) times daily.    Marland Kitchen amLODipine (NORVASC) 5 MG  tablet Take 1 tablet (5 mg total) by mouth daily. 90 tablet 3  . aspirin EC 81 MG tablet Take 1 tablet (81 mg total) by mouth daily. 90 tablet 11  . cetirizine (ZYRTEC) 10 MG tablet Take 1 tablet (10 mg total) by mouth daily. 30 tablet 0  . diazepam (VALIUM) 5 MG tablet Take 1 tablet 30-min prior to MRI.  Ok to repeat at facility, if needed. 2 tablet 0  . Glatiramer Acetate (COPAXONE) 40 MG/ML SOSY INJECT 40 MG UNDER THE SKIN THREE TIMES A WEEK 12 Syringe 11  . meclizine (ANTIVERT) 12.5 MG tablet Take 1 tablet (12.5 mg total) by mouth 3 (three) times daily as needed for dizziness. 30 tablet 1  . methylPREDNISolone (MEDROL DOSEPAK) 4 MG TBPK tablet Take as directed 21 tablet 0  . ranitidine (ZANTAC) 150 MG tablet Take 1 tablet (150 mg total) by mouth 2 (two) times daily. 30 tablet 0  . telmisartan (MICARDIS) 80 MG tablet Take 1 tablet (80 mg total) by mouth daily. 90 tablet 3  . testosterone (ANDROGEL) 50 MG/5GM (1%) GEL Place 5 g onto the skin daily. 450 g 1  . triamcinolone cream (KENALOG) 0.1 % APPLY CREAM EXTERNALLY TO AFFECTED AREA 4 TIMES DAILY FOR 10 DAYS AS NEEDED FOR RASH ITCHING 30 g 1   No current facility-administered medications on file prior to visit.    Review of Systems  Constitutional:  Negative for other unusual diaphoresis or sweats HENT: Negative for ear discharge or swelling Eyes: Negative for other worsening visual disturbances Respiratory: Negative for stridor or other swelling  Gastrointestinal: Negative for worsening distension or other blood Genitourinary: Negative for retention or other urinary change Musculoskeletal: Negative for other MSK pain or swelling Skin: Negative for color change or other new lesions Neurological: Negative for worsening tremors and other numbness  Psychiatric/Behavioral: Negative for worsening agitation or other fatigue All other system neg per pt    Objective:   Physical Exam BP 128/72 (BP Location: Right Arm, Patient Position: Sitting,  Cuff Size: Normal)   Pulse 72   Temp 98.2 F (36.8 C) (Oral)   Ht 5' 7.5" (1.715 m)   Wt 202 lb 4.2 oz (91.7 kg)   SpO2 96%   BMI 31.21 kg/m  VS noted,  Constitutional: Pt appears in NAD HENT: Head: NCAT.  Right Ear: External ear normal.  Left Ear: External ear normal.  Eyes: . Pupils are equal, round, and reactive to light. Conjunctivae and EOM are normal Nose: without d/c or deformity Neck: Neck supple. Gross normal ROM Cardiovascular: Normal rate and regular rhythm.   Pulmonary/Chest: Effort normal and breath sounds without rales or wheezing.  Abd:  Soft, NT, ND, + BS, no organomegaly Right knee with 1-2+ effusion, mild medial warmth but no real tenderness  Neurological: Pt is alert. At baseline orientation, motor grossly intact Skin: Skin is warm. No rashes, other new lesions, no LE edema Psychiatric: Pt behavior is normal without agitation  No other exam findings Lab Results  Component Value Date   WBC 7.4 05/14/2017   HGB 15.0 05/14/2017   HCT 43.6 05/14/2017   PLT 179.0 05/14/2017   GLUCOSE 167 (H) 05/14/2017   CHOL 153 05/14/2017   TRIG 138.0 05/14/2017   HDL 44.20 05/14/2017   LDLCALC 81 05/14/2017   ALT 37 05/14/2017   AST 20 05/14/2017   NA 139 05/14/2017   K 4.5 05/14/2017   CL 104 05/14/2017   CREATININE 0.87 05/14/2017   BUN 11 05/14/2017   CO2 28 05/14/2017   TSH 1.54 05/14/2017   PSA 0.80 05/14/2017   HGBA1C 5.9 05/14/2017       Assessment & Plan:

## 2017-09-26 NOTE — Assessment & Plan Note (Signed)
With pain and effusion, cant r/o DJD flare vs cartilage or meniscal tearing - for pain control, and asked pt to make appt with sports med in this office

## 2017-09-26 NOTE — Patient Instructions (Signed)
You may have a cartilage tear or other arthritis to the right knee  Please take all new medication as prescribed - the pain medication  Please make an appt with Sports Medicine in this office for further consideration  Please continue all other medications as before, and refills have been done if requested.  Please have the pharmacy call with any other refills you may need.  Please continue your efforts at being more active, low cholesterol diet, and weight control.  Please keep your appointments with your specialists as you may have planned

## 2017-09-26 NOTE — Assessment & Plan Note (Signed)
stable overall by history and exam, recent data reviewed with pt, and pt to continue medical treatment as before,  to f/u any worsening symptoms or concerns Lab Results  Component Value Date   HGBA1C 5.9 05/14/2017

## 2017-09-27 ENCOUNTER — Encounter: Payer: Self-pay | Admitting: Family Medicine

## 2017-09-27 ENCOUNTER — Ambulatory Visit (INDEPENDENT_AMBULATORY_CARE_PROVIDER_SITE_OTHER): Payer: BLUE CROSS/BLUE SHIELD | Admitting: Family Medicine

## 2017-09-27 VITALS — BP 128/66 | HR 58 | Ht 67.5 in | Wt 202.0 lb

## 2017-09-27 DIAGNOSIS — M25561 Pain in right knee: Secondary | ICD-10-CM | POA: Diagnosis not present

## 2017-09-27 MED ORDER — DICLOFENAC SODIUM 2 % TD SOLN: 1.0000 "application " | Freq: Two times a day (BID) | TRANSDERMAL | 3 refills | Status: DC

## 2017-09-27 MED ORDER — IBUPROFEN-FAMOTIDINE 800-26.6 MG PO TABS: 1.0000 | ORAL_TABLET | Freq: Three times a day (TID) | ORAL | 3 refills | Status: DC

## 2017-09-27 NOTE — Progress Notes (Signed)
Russell Moses. - 47 y.o. male MRN [id number]  Date of birth: 1970-09-01  SUBJECTIVE:  Including CC & ROS.  Chief Complaint  Patient presents with  . Knee Pain    Russell Moses. is a 47 y.o. male that is presenting with right knee pain. Ongoing for two months. Pain is located on the medical aspect of his right knee. Admits to swelling. Pain is during flexion, worse with walking up or down stairs. Denies trauma or any prior surgeries. He has been taking tramadol as needed for the pain. He stands for long periods daily, works at [employer].   Review of Systems  Constitutional: Negative for fever.  HENT: Negative for congestion.   Respiratory: Negative for cough.   Cardiovascular: Negative for chest pain.  Gastrointestinal: Negative for abdominal pain.  Musculoskeletal: Positive for gait problem.  Skin: Negative for color change.  Neurological: Negative for weakness.  Hematological: Negative for adenopathy.  Psychiatric/Behavioral: Negative for agitation.    HISTORY: Past Medical, Surgical, Social, and Family History Reviewed & Updated per EMR.   Pertinent Historical Findings include:  Past Medical History:  Diagnosis Date  . ASTHMA, CHILDHOOD 04/18/2009   Qualifier: Diagnosis of  By: Jonny Ruiz MD, Len Blalock   . BIPOLAR DISORDER UNSPECIFIED 04/18/2009   Qualifier: Diagnosis of  By: Jonny Ruiz MD, Len Blalock   . GERD (gastroesophageal reflux disease) 07/30/2013  . Hypertension   . Insomnia   . Multiple sclerosis (HCC) 2015  . Psoriasis 12/15/2013  . Ventral hernia without obstruction or gangrene 12/15/2013    Past Surgical History:  Procedure Laterality Date  . HERNIA REPAIR      Allergies  Allergen Reactions  . Penicillins Itching and Rash    Little bumps    Family History  Problem Relation Age of Onset  . Hypertension Mother   . Stroke Father   . Diabetes Father   . Hypertension Father   . Heart disease Father   . Parkinson's disease Father   . COPD Sister   . Fibromyalgia Sister    . Healthy Sister      Social History   Socioeconomic History  . Marital status: Single    Spouse name: Not on file  . Number of children: Not on file  . Years of education: Not on file  . Highest education level: Not on file  Occupational History  . Occupation: Lobbyist: [employer]  Social Needs  . Financial resource strain: Not on file  . Food insecurity:    Worry: Not on file    Inability: Not on file  . Transportation needs:    Medical: Not on file    Non-medical: Not on file  Tobacco Use  . Smoking status: Never Smoker  . Smokeless tobacco: Never Used  Substance and Sexual Activity  . Alcohol use: No    Alcohol/week: 0.0 standard drinks  . Drug use: No  . Sexual activity: Not on file  Lifestyle  . Physical activity:    Days per week: Not on file    Minutes per session: Not on file  . Stress: Not on file  Relationships  . Social connections:    Talks on phone: Not on file    Gets together: Not on file    Attends religious service: Not on file    Active member of club or organization: Not on file    Attends meetings of clubs or organizations: Not on file    Relationship status: Not on file  .  Intimate partner violence:    Fear of current or ex partner: Not on file    Emotionally abused: Not on file    Physically abused: Not on file    Forced sexual activity: Not on file  Other Topics Concern  . 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   He lives with his parents.  He has no children.   Highest level of education:  High school     PHYSICAL EXAM:  VS: BP 128/66 (BP Location: Left Arm, Patient Position: Sitting, Cuff Size: Normal)   Pulse (!) 58   Ht 5' 7.5" (1.715 m)   Wt 202 lb (91.6 kg)   SpO2 98%   BMI 31.17 kg/m  Physical Exam Gen: NAD, alert, cooperative with exam, well-appearing ENT: normal lips, normal nasal mucosa,  Eye: normal EOM, normal conjunctiva and lids CV:  no edema, +2  pedal pulses   Resp: no accessory muscle use, non-labored,  Skin: no rashes, no areas of induration  Neuro: normal tone, normal sensation to touch Psych:  normal insight, alert and oriented MSK:  Right Knee: Normal to inspection with no erythema or effusion or obvious bony abnormalities. Palpation normal with no warmth, patellar tenderness, or condyle tenderness. TTP of the medial joint line  ROM full in flexion and extension and lower leg rotation. Ligaments with solid consistent endpoints including  LCL, MCL. Negative Mcmurray's tests. Non painful patellar compression. Patellar and quadriceps tendons unremarkable. Hamstring and quadriceps strength is normal.  Neurovascularly intact   Limited ultrasound: right knee:  No effusion  Normal appearing QT and PT  Normal appearing lateral joint line  Medial joint line with suggestion of irration overlying the meniscus but normal joint space.   Summary: medial sided irritation   Ultrasound and interpretation by Clare Gandy, MD          ASSESSMENT & PLAN:   Right knee pain Seems to have irritation and effusion overlying the medial joint line. No effusion in the SPP. Normal appearing meniscus and on exam.  - duexis and pennsaid  - counseled on HEP  - counseled on supportive care - if no improvement consider injection or imaging.

## 2017-09-27 NOTE — Assessment & Plan Note (Signed)
Seems to have irritation and effusion overlying the medial joint line. No effusion in the SPP. Normal appearing meniscus and on exam.  - duexis and pennsaid  - counseled on HEP  - counseled on supportive care - if no improvement consider injection or imaging.

## 2017-09-27 NOTE — Patient Instructions (Signed)
Nice to meet you  Please try the medication  Please try ice and compression  Please try the exercises  Please follow up with me in 3-4 weeks if the pain is ongoing.

## 2017-11-18 ENCOUNTER — Encounter: Payer: Self-pay | Admitting: Neurology

## 2017-11-18 ENCOUNTER — Ambulatory Visit (INDEPENDENT_AMBULATORY_CARE_PROVIDER_SITE_OTHER): Payer: BLUE CROSS/BLUE SHIELD | Admitting: Neurology

## 2017-11-18 VITALS — BP 110/80 | HR 60 | Ht 67.5 in | Wt 197.4 lb

## 2017-11-18 DIAGNOSIS — G35 Multiple sclerosis: Secondary | ICD-10-CM

## 2017-11-18 NOTE — Progress Notes (Signed)
Follow-up Visit   Date: 11/18/17    Russell Moses. MRN: [id number] DOB: 12/31/1970   Interim History: Russell Moses. is a 47 y.o. right-handed Caucasian male with history of hypertension, bipolar disorder, asthma, herpetic zoster, and anxiety returning to the clinic for follow-up of relapsing remitting multiple sclerosis.  The patient was accompanied to the clinic by self.  History of present illness: In 2014, he developed a tingling sensation over his head and get "swimming headed", which lasted a few seconds and spontaneously resolved. Starting in February 2015, he developed tremors of the hands and bilateral leg weakness.  On March 5th 2015, he was at work and started shaking, lost his energy, felt as if his head was heavy, and was short of breath so called his mom who took him to [facility]. He has CT head and lab work was unrevealing. His MRI brain in April 2015 showed white matter changes concerning for demyelinating disease.  CSF testing was also consistent with MS.  He was treated with 3-days of IVMP in May 2015 which significantly improved symptoms.  He started copaxone on 10/2013 and has been tolerating it well, besides injection site lipodystrophy.  Annual surveillance imaging has shown stable disease burden and he has not had any relapses since initial diagnosis in Spring 2015.  UPDATE 11/15/2016:  He has been doing well with no new neurological concerns.  He is doing well on Copaxone, but now that it is available in generic, we are having difficulty trying to get it approved. He has many questions about the compound make-up of generic vs. brand formulation.   UPDATE 05/17/2017:   In early April, he develops fatigue and burning sensation of the scalp, which was followed by high anxiety. He did not have any weakness, vision changes, or numbness/tingling.  He went to the ER and given prednisone course for possible MS exacerbation.  He has been doing much better.  He endorses  being anxious during this time and has several episodes like this in the past.  UPDATE 11/18/2017:  He is here for follow-up visit.  He is doing well with respect to multiple sclerosis with no new neurological symptoms.  Annual eye exam earlier this year was normal.  He has many other concerns related to energy, changes to his body with timing of meals/drinks, etc. He has brought a folder with > 75 pages of articles he would like for me to review regarding metabolism of NADH and GABA for neurological conditions.    Medications:  Current Outpatient Medications on File Prior to Visit  Medication Sig Dispense Refill  . amLODipine (NORVASC) 5 MG tablet Take 1 tablet (5 mg total) by mouth daily. 90 tablet 3  . aspirin EC 81 MG tablet Take 1 tablet (81 mg total) by mouth daily. 90 tablet 11  . cetirizine (ZYRTEC) 10 MG tablet Take 1 tablet (10 mg total) by mouth daily. 30 tablet 0  . diazepam (VALIUM) 5 MG tablet Take 1 tablet 30-min prior to MRI.  Ok to repeat at facility, if needed. 2 tablet 0  . Diclofenac Sodium (PENNSAID) 2 % SOLN Place 1 application onto the skin 2 (two) times daily. [id number] (Mobile) 1 Bottle 3  . Glatiramer Acetate (COPAXONE) 40 MG/ML SOSY INJECT 40 MG UNDER THE SKIN THREE TIMES A WEEK 12 Syringe 11  . Ibuprofen-Famotidine 800-26.6 MG TABS Take 1 tablet by mouth 3 (three) times daily. 90 tablet 3  . ranitidine (ZANTAC) 150 MG tablet Take 1  tablet (150 mg total) by mouth 2 (two) times daily. 30 tablet 0  . telmisartan (MICARDIS) 80 MG tablet Take 1 tablet (80 mg total) by mouth daily. 90 tablet 3  . testosterone (ANDROGEL) 50 MG/5GM (1%) GEL Place 5 g onto the skin daily. 450 g 1  . traMADol (ULTRAM) 50 MG tablet Take 1 tablet (50 mg total) by mouth every 8 (eight) hours as needed. 30 tablet 0  . triamcinolone cream (KENALOG) 0.1 % APPLY CREAM EXTERNALLY TO AFFECTED AREA 4 TIMES DAILY FOR 10 DAYS AS NEEDED FOR RASH ITCHING 30 g 1   No current facility-administered  medications on file prior to visit.     Allergies:  Allergies  Allergen Reactions  . Penicillins Itching and Rash    Little bumps     Review of Systems:  CONSTITUTIONAL: No fevers, chills, night sweats, or weight loss.   EYES: No visual changes or eye pain ENT: No hearing changes.  No history of nose bleeds.   RESPIRATORY: No cough, wheezing and shortness of breath.   CARDIOVASCULAR: Negative for chest pain, and palpitations.   GI: Negative for abdominal discomfort, blood in stools or black stools.  No recent change in bowel habits.   GU:  No history of incontinence.   MUSCLOSKELETAL: No history of joint pain or swelling.  No myalgias.   SKIN: Negative for lesions, + rash, and itching.   ENDOCRINE: Negative for cold or heat intolerance, polydipsia or goiter.   PSYCH: No depression or anxiety symptoms.   NEURO: As Above.   Vital Signs:  BP 110/80   Pulse 60   Ht 5' 7.5" (1.715 m)   Wt 197 lb 6 oz (89.5 kg)   SpO2 98%   BMI 30.46 kg/m   General Medical Exam:   General:  Well appearing, comfortable  Eyes/ENT: see cranial nerve examination.   Neck: No masses appreciated.  Full range of motion without tenderness.  No carotid bruits. Respiratory:  Clear to auscultation, good air entry bilaterally.   Cardiac:  Regular rate and rhythm, no murmur.   Ext:  No edema  Neurological Exam: MENTAL STATUS including orientation to time, place, person, recent and remote memory, attention span and concentration, language, and fund of knowledge is normal.  Speech is not dysarthric.  CRANIAL NERVES: No visual field defects. Pupils equal round and reactive to light.  Normal conjugate, extra-ocular eye movements in all directions of gaze.  No ptosis. Normal facial sensation.  Face is symmetric. Palate elevates symmetrically.  Tongue is midline.  MOTOR:  Motor strength is 5/5 in all extremities.  No pronator drift.  Tone is normal.    MSRs:  Reflexes are 2+/4 throughout.  SENSORY:  Intact  to vibration.  COORDINATION/GAIT:  Normal finger-to- nose-finger.  Intact rapid alternating movements bilaterally.  Gait narrow based and stable.    Data: MRI brain wwo contrast 11/28/2016:   Essentially stable findings of multiple sclerosis. No acute intracranial abnormality.  MRI brain wwo contrast 11/17/2015:  No significant changes since 12/16/2014.  No abnormal enhancement.  MRI brain wwo contrast 12/16/2014: 1. Unchanged supratentorial white matter lesions. No definite new lesions. No abnormal enhancement or restricted diffusion. 2. 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 3. Paransal sinus inflammatory change, correlate clinically.  MRI brain wwo contrast 12/25/2013: Multiple lesoins in the periventricular and juxtacortical white matter regions bilaterally. There is no longer enhancement or diffusion abnormality. No new lesions  MRI cervical spine wwo contrast: Normal  MRI brain wwo contrast 05/28/2013:  Multiple lesions in the periventricular and juxtacortical white matter bilaterally, one of which enhances.  The pattern and distribution of these lesions suggest demyelinating disease such as multiple sclerosis.  MRI cervical spine wwo contrast 06/04/2013:  Normal MRI cervical spine   EMG right upper and lower extremity 05/14/2013:  There is electrophysiological evidence of an intraspinal canal lesion (i.e. radiculopathy) affecting the L5 nerve root/segment bilaterally. Overall, these changes are mild in degree electrically and worse on the right side. There is no evidence of a generalized sensorimotor polyneuropathy, cervical radiculopathy or diffuse myopathy.   Labs 03/27/2013:  TSH 0.73, B12 847, ESR 14, vitamin D 37 Labs 05/14/2013:  CK 26, aldolase 26, copper 91, MG panel negative  CSF 06/22/2013: R0 W0 G61 P27  ACE 4, Lyme neg, MBP 61, IgG 1.6 OCB The patient's CSF contains >5 well defined gamma  restriction bands that are also present in the patient's  corresponding serum sample, but some bands in the CSF are more prominent.  CSF cytology with few mononuclear cells  Lab Results  Component Value Date   TSH 1.54 05/14/2017   Lab Results  Component Value Date   [id number] 05/17/2017    IMPRESSION/PLAN: Relapsing-remitting multiple sclerosis, diagnosed in May 2015 manifesting with generalized fatigue and tremors.  MRI brain showed periventricular and juxtacortical white matter bilaterally, which has been stable on his serial imaging.  He has been doing well on Copaxone since 10/2013. Exam remains normal.  He is due to annual surveillance imaging with MRI brain wwo contrast.  Continue Copaxone 46m three times weekly.  He is not tolerating vitamin D supplementation, so has decided not to take this.    Return to clinic in 1 year  Thank you for allowing me to participate in patient's care.  If I can answer any additional questions, I would be pleased to do so.    Sincerely,    Yukiko Minnich K. PPosey Pronto DO

## 2017-11-18 NOTE — Patient Instructions (Signed)
MRI brain wwo contrast  Continue Copaxone 40mg  three times weekly  Return to clinic in 1 year

## 2017-11-19 ENCOUNTER — Encounter: Payer: Self-pay | Admitting: Internal Medicine

## 2017-11-19 ENCOUNTER — Ambulatory Visit (INDEPENDENT_AMBULATORY_CARE_PROVIDER_SITE_OTHER): Payer: BLUE CROSS/BLUE SHIELD | Admitting: Internal Medicine

## 2017-11-19 VITALS — BP 122/76 | HR 55 | Temp 98.1°F | Ht 67.5 in | Wt 198.0 lb

## 2017-11-19 DIAGNOSIS — I10 Essential (primary) hypertension: Secondary | ICD-10-CM | POA: Diagnosis not present

## 2017-11-19 DIAGNOSIS — E785 Hyperlipidemia, unspecified: Secondary | ICD-10-CM | POA: Diagnosis not present

## 2017-11-19 DIAGNOSIS — Z0000 Encounter for general adult medical examination without abnormal findings: Secondary | ICD-10-CM | POA: Diagnosis not present

## 2017-11-19 DIAGNOSIS — R739 Hyperglycemia, unspecified: Secondary | ICD-10-CM | POA: Diagnosis not present

## 2017-11-19 NOTE — Assessment & Plan Note (Signed)
stable overall by history and exam, recent data reviewed with pt, and pt to continue medical treatment as before,  to f/u any worsening symptoms or concerns  

## 2017-11-19 NOTE — Assessment & Plan Note (Signed)
stable overall by history and exam, recent data reviewed with pt, and pt to continue medical treatment as before,  to f/u any worsening symptoms or concerns. For POCT A1c today

## 2017-11-19 NOTE — Patient Instructions (Signed)
Your A1c was OK today  Please continue all other medications as before, and refills have been done if requested.  Please have the pharmacy call with any other refills you may need.  Please continue your efforts at being more active, low cholesterol diet, and weight control..  Please keep your appointments with your specialists as you may have planned  Please return in 6 months, or sooner if needed, with Lab testing done 3-5 days before  

## 2017-11-19 NOTE — Progress Notes (Signed)
Subjective:    Patient ID: Russell Penner., male    DOB: November 29, 1970, 47 y.o.   MRN: [id number]  HPI   Here to f/u; overall doing ok,  Pt denies chest pain, increasing sob or doe, wheezing, orthopnea, PND, increased LE swelling, palpitations, dizziness or syncope.  Pt denies new neurological symptoms such as new headache, or facial or extremity weakness or numbness.  Pt denies polydipsia, polyuria, or low sugar episode.  Pt states overall good compliance with meds, mostly trying to follow appropriate diet, with wt overall stable,  but little exercise however.  Has seen Dr Katrinka Blazing and pain cleared up, no further right knee pain, except can get a stiffness after sitting or laying down too long.  Saw neuro yesterday.  Noted some intolerance to high dose Vit d to him is anything over 500 units, seemed to make muscle hurt, same with vitamins.  Omega 3 OTC seemed to cause "everything to go faster."  Now feels more calm without taking anything.  Denies worsening depressive symptoms, suicidal ideation, or panic,   Pt denies fever, wt loss, night sweats, loss of appetite, or other constitutional symptoms.  Having dental surgury next year to eventually get 2 teeth pulled and the partial placement for lower teeth, wondering about getting the lidocaine injection for anesthesia. Lost 4 lbs with better diet and no seconds or sweets recently Wt Readings from Last 3 Encounters:  11/19/17 198 lb (89.8 kg)  11/18/17 197 lb 6 oz (89.5 kg)  09/27/17 202 lb (91.6 kg)   Past Medical History:  Diagnosis Date  . ASTHMA, CHILDHOOD 04/18/2009   Qualifier: Diagnosis of  By: Jonny Ruiz MD, Len Blalock   . BIPOLAR DISORDER UNSPECIFIED 04/18/2009   Qualifier: Diagnosis of  By: Jonny Ruiz MD, Len Blalock   . GERD (gastroesophageal reflux disease) 07/30/2013  . Hypertension   . Insomnia   . Multiple sclerosis (HCC) 2015  . Psoriasis 12/15/2013  . Ventral hernia without obstruction or gangrene 12/15/2013   Past Surgical History:  Procedure  Laterality Date  . HERNIA REPAIR      reports that he has never smoked. He has never used smokeless tobacco. He reports that he does not drink alcohol or use drugs. family history includes COPD in his sister; Diabetes in his father; Fibromyalgia in his sister; Healthy in his sister; Heart disease in his father; Hypertension in his father and mother; Parkinson's disease in his father; Stroke in his father. Allergies  Allergen Reactions  . Penicillins Itching and Rash    Little bumps   Current Outpatient Medications on File Prior to Visit  Medication Sig Dispense Refill  . amLODipine (NORVASC) 5 MG tablet Take 1 tablet (5 mg total) by mouth daily. 90 tablet 3  . aspirin EC 81 MG tablet Take 1 tablet (81 mg total) by mouth daily. 90 tablet 11  . cetirizine (ZYRTEC) 10 MG tablet Take 1 tablet (10 mg total) by mouth daily. 30 tablet 0  . diazepam (VALIUM) 5 MG tablet Take 1 tablet 30-min prior to MRI.  Ok to repeat at facility, if needed. 2 tablet 0  . Diclofenac Sodium (PENNSAID) 2 % SOLN Place 1 application onto the skin 2 (two) times daily. [phone] (Mobile) 1 Bottle 3  . Glatiramer Acetate (COPAXONE) 40 MG/ML SOSY INJECT 40 MG UNDER THE SKIN THREE TIMES A WEEK 12 Syringe 11  . Ibuprofen-Famotidine 800-26.6 MG TABS Take 1 tablet by mouth 3 (three) times daily. 90 tablet 3  . ranitidine (ZANTAC) 150  MG tablet Take 1 tablet (150 mg total) by mouth 2 (two) times daily. 30 tablet 0  . telmisartan (MICARDIS) 80 MG tablet Take 1 tablet (80 mg total) by mouth daily. 90 tablet 3  . testosterone (ANDROGEL) 50 MG/5GM (1%) GEL Place 5 g onto the skin daily. 450 g 1  . traMADol (ULTRAM) 50 MG tablet Take 1 tablet (50 mg total) by mouth every 8 (eight) hours as needed. 30 tablet 0  . triamcinolone cream (KENALOG) 0.1 % APPLY CREAM EXTERNALLY TO AFFECTED AREA 4 TIMES DAILY FOR 10 DAYS AS NEEDED FOR RASH ITCHING 30 g 1   No current facility-administered medications on file prior to visit.    Review of  Systems  Constitutional: Negative for other unusual diaphoresis or sweats HENT: Negative for ear discharge or swelling Eyes: Negative for other worsening visual disturbances Respiratory: Negative for stridor or other swelling  Gastrointestinal: Negative for worsening distension or other blood Genitourinary: Negative for retention or other urinary change Musculoskeletal: Negative for other MSK pain or swelling Skin: Negative for color change or other new lesions Neurological: Negative for worsening tremors and other numbness  Psychiatric/Behavioral: Negative for worsening agitation or other fatigue All other system neg per pt    Objective:   Physical Exam BP 122/76   Pulse (!) 55   Temp 98.1 F (36.7 C) (Oral)   Ht 5' 7.5" (1.715 m)   Wt 198 lb (89.8 kg)   SpO2 96%   BMI 30.55 kg/m  VS noted,  Constitutional: Pt appears in NAD HENT: Head: NCAT.  Right Ear: External ear normal.  Left Ear: External ear normal.  Eyes: . Pupils are equal, round, and reactive to light. Conjunctivae and EOM are normal Nose: without d/c or deformity Neck: Neck supple. Gross normal ROM Cardiovascular: Normal rate and regular rhythm.   Pulmonary/Chest: Effort normal and breath sounds without rales or wheezing.  Abd:  Soft, NT, ND, + BS, no organomegaly Neurological: Pt is alert. At baseline orientation, motor grossly intact Skin: Skin is warm. No rashes, other new lesions, no LE edema Psychiatric: Pt behavior is normal without agitation  No other exam findings Lab Results  Component Value Date   WBC 7.4 05/14/2017   HGB 15.0 05/14/2017   HCT 43.6 05/14/2017   PLT 179.0 05/14/2017   GLUCOSE 167 (H) 05/14/2017   CHOL 153 05/14/2017   TRIG 138.0 05/14/2017   HDL 44.20 05/14/2017   LDLCALC 81 05/14/2017   ALT 37 05/14/2017   AST 20 05/14/2017   NA 139 05/14/2017   K 4.5 05/14/2017   CL 104 05/14/2017   CREATININE 0.87 05/14/2017   BUN 11 05/14/2017   CO2 28 05/14/2017   TSH 1.54 05/14/2017    PSA 0.80 05/14/2017   HGBA1C 5.9 05/14/2017       Assessment & Plan:

## 2017-11-22 ENCOUNTER — Other Ambulatory Visit: Payer: Self-pay | Admitting: *Deleted

## 2017-11-22 ENCOUNTER — Encounter: Payer: Self-pay | Admitting: *Deleted

## 2017-11-22 ENCOUNTER — Telehealth: Payer: Self-pay | Admitting: Neurology

## 2017-11-22 DIAGNOSIS — G35 Multiple sclerosis: Secondary | ICD-10-CM

## 2017-11-22 NOTE — Telephone Encounter (Signed)
Patient is calling in wanting to check the status of an open MRI that was supposed to be sent to [facility]. Please call him back at [phone]. Thanks!

## 2017-11-22 NOTE — Telephone Encounter (Signed)
New referral sent to [facility].  Patient notified via My Chart.

## 2017-11-27 ENCOUNTER — Other Ambulatory Visit: Payer: Self-pay | Admitting: *Deleted

## 2017-11-27 MED ORDER — DIAZEPAM 5 MG PO TABS: ORAL_TABLET | ORAL | 0 refills | Status: DC

## 2017-12-03 ENCOUNTER — Telehealth: Payer: Self-pay | Admitting: *Deleted

## 2017-12-03 NOTE — Telephone Encounter (Signed)
MRI results received from [facility].  Impression: no significant change since 11-28-16.

## 2018-03-27 ENCOUNTER — Other Ambulatory Visit: Payer: Self-pay | Admitting: Internal Medicine

## 2018-03-31 ENCOUNTER — Telehealth: Payer: Self-pay | Admitting: Neurology

## 2018-03-31 ENCOUNTER — Other Ambulatory Visit: Payer: Self-pay | Admitting: *Deleted

## 2018-03-31 ENCOUNTER — Other Ambulatory Visit: Payer: BLUE CROSS/BLUE SHIELD

## 2018-03-31 MED ORDER — GLATIRAMER ACETATE 40 MG/ML ~~LOC~~ SOSY: PREFILLED_SYRINGE | SUBCUTANEOUS | 11 refills | Status: DC

## 2018-03-31 NOTE — Telephone Encounter (Signed)
Patient called regarding his Copaxone medication needing to be refilled. He said [facility] switched pharmacy's and it is through [employer] [phone]. Please Call. Thanks

## 2018-03-31 NOTE — Telephone Encounter (Signed)
Rx faxed to new pharmacy.

## 2018-04-02 ENCOUNTER — Telehealth: Payer: Self-pay | Admitting: Neurology

## 2018-04-02 NOTE — Telephone Encounter (Signed)
Patient called regarding Insurance Forms needing to be faxed for his Insurance to cover his medication. He said he has 5 shots left. Thanks

## 2018-04-04 MED ORDER — GLATIRAMER ACETATE 40 MG/ML ~~LOC~~ SOSY: PREFILLED_SYRINGE | SUBCUTANEOUS | 3 refills | Status: DC

## 2018-04-04 NOTE — Telephone Encounter (Signed)
amer[employer]am services called about patient medication needing a prior auth done. They did not have the name of the medications but gave me the phone to call [phone] opt 4 then opt 1

## 2018-04-04 NOTE — Telephone Encounter (Signed)
New ID# [id number] Prior approval received. #[id number] valid now until 04/05/2023. Patient made aware. He will contact specialty pharmacy.

## 2018-04-04 NOTE — Telephone Encounter (Signed)
Reviewed previous notes. Patient asked for this RX to be sent to [facility]. It looks like it was sent to [facility] in [place]. I have sent to [facility]. Will call later to make sure they do not need anything from [place].

## 2018-04-04 NOTE — Telephone Encounter (Signed)
Copaxone needs prior authorization. We did not have patient's updated insurance information. I did call him to get this.  [facility] [employer] through [place] ID# [id number] Catheryn Bacon: [id number] PCN: [id number] benefit phone number: [phone].  I called (covermymeds would not work) and they are sending over a form for completion for expedited prior authorization.

## 2018-04-07 NOTE — Telephone Encounter (Signed)
Noted  

## 2018-05-12 ENCOUNTER — Encounter: Payer: Self-pay | Admitting: Internal Medicine

## 2018-05-20 ENCOUNTER — Other Ambulatory Visit (INDEPENDENT_AMBULATORY_CARE_PROVIDER_SITE_OTHER): Payer: BLUE CROSS/BLUE SHIELD

## 2018-05-20 DIAGNOSIS — Z0000 Encounter for general adult medical examination without abnormal findings: Secondary | ICD-10-CM

## 2018-05-20 DIAGNOSIS — R739 Hyperglycemia, unspecified: Secondary | ICD-10-CM

## 2018-05-20 DIAGNOSIS — E785 Hyperlipidemia, unspecified: Secondary | ICD-10-CM | POA: Diagnosis not present

## 2018-05-20 LAB — BASIC METABOLIC PANEL WITH GFR
BUN: 12 mg/dL (ref 6–23)
CO2: 26 meq/L (ref 19–32)
Calcium: 9.1 mg/dL (ref 8.4–10.5)
Chloride: 105 meq/L (ref 96–112)
Creatinine, Ser: 0.84 mg/dL (ref 0.40–1.50)
GFR: 97.12 mL/min
Glucose, Bld: 118 mg/dL — ABNORMAL HIGH (ref 70–99)
Potassium: 4.3 meq/L (ref 3.5–5.1)
Sodium: 139 meq/L (ref 135–145)

## 2018-05-20 LAB — TSH: TSH: 1.69 u[IU]/mL (ref 0.35–4.50)

## 2018-05-20 LAB — CBC WITH DIFFERENTIAL/PLATELET
Basophils Absolute: 0 10*3/uL (ref 0.0–0.1)
Basophils Relative: 0.8 % (ref 0.0–3.0)
Eosinophils Absolute: 0.2 10*3/uL (ref 0.0–0.7)
Eosinophils Relative: 4.1 % (ref 0.0–5.0)
HCT: 41.1 % (ref 39.0–52.0)
Hemoglobin: 14 g/dL (ref 13.0–17.0)
Lymphocytes Relative: 37.2 % (ref 12.0–46.0)
Lymphs Abs: 1.7 10*3/uL (ref 0.7–4.0)
MCHC: 34.1 g/dL (ref 30.0–36.0)
MCV: 85.9 fl (ref 78.0–100.0)
Monocytes Absolute: 0.5 10*3/uL (ref 0.1–1.0)
Monocytes Relative: 9.9 % (ref 3.0–12.0)
Neutro Abs: 2.3 10*3/uL (ref 1.4–7.7)
Neutrophils Relative %: 48 % (ref 43.0–77.0)
Platelets: 147 10*3/uL — ABNORMAL LOW (ref 150.0–400.0)
RBC: 4.79 Mil/uL (ref 4.22–5.81)
RDW: 14.3 % (ref 11.5–15.5)
WBC: 4.7 10*3/uL (ref 4.0–10.5)

## 2018-05-20 LAB — HEPATIC FUNCTION PANEL
ALT: 38 U/L (ref 0–53)
AST: 23 U/L (ref 0–37)
Albumin: 3.9 g/dL (ref 3.5–5.2)
Alkaline Phosphatase: 107 U/L (ref 39–117)
Bilirubin, Direct: 0.1 mg/dL (ref 0.0–0.3)
Total Bilirubin: 0.4 mg/dL (ref 0.2–1.2)
Total Protein: 6.3 g/dL (ref 6.0–8.3)

## 2018-05-20 LAB — HEMOGLOBIN A1C: Hgb A1c MFr Bld: 5.8 % (ref 4.6–6.5)

## 2018-05-20 LAB — URINALYSIS, ROUTINE W REFLEX MICROSCOPIC
Bilirubin Urine: NEGATIVE
Hgb urine dipstick: NEGATIVE
Ketones, ur: NEGATIVE
Leukocytes,Ua: NEGATIVE
Nitrite: NEGATIVE
RBC / HPF: NONE SEEN (ref 0–?)
Specific Gravity, Urine: 1.015 (ref 1.000–1.030)
Total Protein, Urine: NEGATIVE
Urine Glucose: NEGATIVE
Urobilinogen, UA: 0.2 (ref 0.0–1.0)
pH: 6 (ref 5.0–8.0)

## 2018-05-20 LAB — PSA: PSA: 0.47 ng/mL (ref 0.10–4.00)

## 2018-05-20 LAB — LIPID PANEL
Cholesterol: 137 mg/dL (ref 0–200)
HDL: 32.5 mg/dL — ABNORMAL LOW
LDL Cholesterol: 83 mg/dL (ref 0–99)
NonHDL: 104
Total CHOL/HDL Ratio: 4
Triglycerides: 103 mg/dL (ref 0.0–149.0)
VLDL: 20.6 mg/dL (ref 0.0–40.0)

## 2018-05-21 ENCOUNTER — Ambulatory Visit (INDEPENDENT_AMBULATORY_CARE_PROVIDER_SITE_OTHER): Payer: BLUE CROSS/BLUE SHIELD | Admitting: Internal Medicine

## 2018-05-21 ENCOUNTER — Encounter: Payer: Self-pay | Admitting: Internal Medicine

## 2018-05-21 ENCOUNTER — Telehealth: Payer: Self-pay | Admitting: Internal Medicine

## 2018-05-21 DIAGNOSIS — I10 Essential (primary) hypertension: Secondary | ICD-10-CM

## 2018-05-21 DIAGNOSIS — Z0000 Encounter for general adult medical examination without abnormal findings: Secondary | ICD-10-CM

## 2018-05-21 DIAGNOSIS — R739 Hyperglycemia, unspecified: Secondary | ICD-10-CM | POA: Diagnosis not present

## 2018-05-21 NOTE — Patient Instructions (Signed)
Please continue all other medications as before, and refills have been done if requested.  Please have the pharmacy call with any other refills you may need.  Please continue your efforts at being more active, low cholesterol diet, and weight control.  You are otherwise up to date with prevention measures today.  Please keep your appointments with your specialists as you may have planned  Please return in 6 months, or sooner if needed, with Lab testing done 3-5 days before  

## 2018-05-21 NOTE — Assessment & Plan Note (Signed)
stable overall by history and exam, recent data reviewed with pt, and pt to continue medical treatment as before,  to f/u any worsening symptoms or concerns  

## 2018-05-21 NOTE — Assessment & Plan Note (Signed)

## 2018-05-21 NOTE — Telephone Encounter (Signed)
Left patient vm to call back to schedule 6 month fu.  °

## 2018-05-21 NOTE — Progress Notes (Signed)
Patient ID: Russell PennerBuddy Vaden Jr., male   DOB: 20-Aug-1970, 48 y.o.   MRN: [id number]  Virtual Visit via Video Note  I connected with Russell PennerBuddy Lahaie Jr. on 05/21/18 at  8:00 AM EDT by a video enabled telemedicine application and verified that I am speaking with the correct person using two identifiers. I am at the office, pt is at home, and no others present   I discussed the limitations of evaluation and management by telemedicine and the availability of in person appointments. The patient expressed understanding and agreed to proceed.  History of Present Illness: Here for wellness and f/u;  Overall doing ok;  Pt denies Chest pain, worsening SOB, DOE, wheezing, orthopnea, PND, worsening LE edema, palpitations, dizziness or syncope.  Pt denies neurological change such as new headache, facial or extremity weakness.  Pt denies polydipsia, polyuria, or low sugar symptoms. Pt states overall good compliance with treatment and medications, good tolerability, and has been trying to follow appropriate diet.  Pt denies worsening depressive symptoms, suicidal ideation or panic. No fever, night sweats, wt loss, loss of appetite, or other constitutional symptoms.  Pt states good ability with ADL's, has low fall risk, home safety reviewed and adequate, no other significant changes in hearing or vision, and only occasionally active with exercise.  Wt overall stable about 192.  Declines Tdap for now due to pandemic. No new complaints  BP at home recently has been several times < 140/90 Past Medical History:  Diagnosis Date  . ASTHMA, CHILDHOOD 04/18/2009   Qualifier: Diagnosis of  By: Jonny RuizJohn MD, Len BlalockJames W   . BIPOLAR DISORDER UNSPECIFIED 04/18/2009   Qualifier: Diagnosis of  By: Jonny RuizJohn MD, Len BlalockJames W   . GERD (gastroesophageal reflux disease) 07/30/2013  . Hypertension   . Insomnia   . Multiple sclerosis (HCC) 2015  . Psoriasis 12/15/2013  . Ventral hernia without obstruction or gangrene 12/15/2013   Past Surgical History:   Procedure Laterality Date  . HERNIA REPAIR      reports that he has never smoked. He has never used smokeless tobacco. He reports that he does not drink alcohol or use drugs. family history includes COPD in his sister; Diabetes in his father; Fibromyalgia in his sister; Healthy in his sister; Heart disease in his father; Hypertension in his father and mother; Parkinson's disease in his father; Stroke in his father. Allergies  Allergen Reactions  . Zyrtec [Cetirizine] Other (See Comments)    Fatigued, felt bad, weak  . Penicillins Itching and Rash    Little bumps   Current Outpatient Medications on File Prior to Visit  Medication Sig Dispense Refill  . amLODipine (NORVASC) 5 MG tablet Take 1 tablet (5 mg total) by mouth daily. 90 tablet 3  . aspirin EC 81 MG tablet Take 1 tablet (81 mg total) by mouth daily. 90 tablet 11  . diazepam (VALIUM) 5 MG tablet Take 1 tablet 30-min prior to MRI.  Ok to repeat at facility, if needed. 2 tablet 0  . Diclofenac Sodium (PENNSAID) 2 % SOLN Place 1 application onto the skin 2 (two) times daily. [phone] (Mobile) 1 Bottle 3  . Glatiramer Acetate (COPAXONE) 40 MG/ML SOSY INJECT 40 MG UNDER THE SKIN THREE TIMES A WEEK 36 Syringe 3  . Ibuprofen-Famotidine 800-26.6 MG TABS Take 1 tablet by mouth 3 (three) times daily. 90 tablet 3  . ranitidine (ZANTAC) 150 MG tablet Take 1 tablet (150 mg total) by mouth 2 (two) times daily. 30 tablet 0  . telmisartan (MICARDIS)  80 MG tablet TAKE 1 TABLET BY MOUTH ONCE DAILY 90 tablet 0  . testosterone (ANDROGEL) 50 MG/5GM (1%) GEL Place 5 g onto the skin daily. 450 g 1  . traMADol (ULTRAM) 50 MG tablet Take 1 tablet (50 mg total) by mouth every 8 (eight) hours as needed. 30 tablet 0  . triamcinolone cream (KENALOG) 0.1 % APPLY CREAM EXTERNALLY TO AFFECTED AREA 4 TIMES DAILY FOR 10 DAYS AS NEEDED FOR RASH ITCHING 30 g 1   No current facility-administered medications on file prior to visit.      Observations/Objective: Alert, obese, NAD, mentating well normal mood and affect, cn 2-12 intact, moves all 4s, no visible rash or swelling, resps normal Lab Results  Component Value Date   WBC 4.7 05/20/2018   HGB 14.0 05/20/2018   HCT 41.1 05/20/2018   PLT 147.0 (L) 05/20/2018   GLUCOSE 118 (H) 05/20/2018   CHOL 137 05/20/2018   TRIG 103.0 05/20/2018   HDL 32.50 (L) 05/20/2018   LDLCALC 83 05/20/2018   ALT 38 05/20/2018   AST 23 05/20/2018   NA 139 05/20/2018   K 4.3 05/20/2018   CL 105 05/20/2018   CREATININE 0.84 05/20/2018   BUN 12 05/20/2018   CO2 26 05/20/2018   TSH 1.69 05/20/2018   PSA 0.47 05/20/2018   HGBA1C 5.8 05/20/2018   Assessment and Plan: See notes  Follow Up Instructions: See notes   I discussed the assessment and treatment plan with the patient. The patient was provided an opportunity to ask questions and all were answered. The patient agreed with the plan and demonstrated an understanding of the instructions.   The patient was advised to call back or seek an in-person evaluation if the symptoms worsen or if the condition fails to improve as anticipated.   Oliver Barre, MD

## 2018-06-13 ENCOUNTER — Encounter: Payer: Self-pay | Admitting: *Deleted

## 2018-07-04 ENCOUNTER — Other Ambulatory Visit: Payer: Self-pay | Admitting: Internal Medicine

## 2018-08-08 ENCOUNTER — Other Ambulatory Visit: Payer: Self-pay | Admitting: Internal Medicine

## 2018-10-28 ENCOUNTER — Telehealth: Payer: Self-pay | Admitting: Neurology

## 2018-10-28 DIAGNOSIS — G35 Multiple sclerosis: Secondary | ICD-10-CM

## 2018-10-28 NOTE — Telephone Encounter (Signed)
Patient called and said he'd like Dr. Posey Pronto to know he has experienced a new symptom, which is occurring every now and then, involing his arms and wrists having spasms or twitching.

## 2018-10-29 ENCOUNTER — Other Ambulatory Visit: Payer: Self-pay

## 2018-10-29 DIAGNOSIS — G35 Multiple sclerosis: Secondary | ICD-10-CM

## 2018-10-29 MED ORDER — DIAZEPAM 5 MG PO TABS: ORAL_TABLET | ORAL | 0 refills | Status: DC

## 2018-10-29 NOTE — Telephone Encounter (Signed)
Please send in rx on 10/30/2018 thanks

## 2018-10-29 NOTE — Telephone Encounter (Signed)
Rx sent 

## 2018-10-29 NOTE — Telephone Encounter (Signed)
Patient called and said he has an MRI scheduled on 11/03/2018 at 12:30 PM at [place].   He is requesting a prescription for Valium to help with anxiety during the test. He said Dr. Posey Pronto has prescribed it for him previously for the same reason.  [facility] [place]

## 2018-10-29 NOTE — Telephone Encounter (Signed)
Order Mri at [facility].Ordered

## 2018-10-29 NOTE — Telephone Encounter (Signed)
Please advise on recommendations.

## 2018-10-29 NOTE — Telephone Encounter (Signed)
He is due for annual MRI brain wwo contrast for multiple sclerosis. Let's plan on ordering this - he gets it at [place].

## 2018-11-03 ENCOUNTER — Encounter: Payer: Self-pay | Admitting: Neurology

## 2018-11-04 ENCOUNTER — Telehealth: Payer: Self-pay | Admitting: Neurology

## 2018-11-04 NOTE — Telephone Encounter (Signed)
MRI brain wwo contrast 11/03/2018:  No new lesions compared with December 02, 2017.  No lesions with acute features.

## 2018-11-13 ENCOUNTER — Encounter: Payer: Self-pay | Admitting: Internal Medicine

## 2018-11-21 ENCOUNTER — Other Ambulatory Visit: Payer: Self-pay

## 2018-11-21 ENCOUNTER — Other Ambulatory Visit (INDEPENDENT_AMBULATORY_CARE_PROVIDER_SITE_OTHER): Payer: BC Managed Care – PPO

## 2018-11-21 ENCOUNTER — Encounter: Payer: Self-pay | Admitting: Neurology

## 2018-11-21 ENCOUNTER — Ambulatory Visit (INDEPENDENT_AMBULATORY_CARE_PROVIDER_SITE_OTHER): Payer: BC Managed Care – PPO | Admitting: Internal Medicine

## 2018-11-21 ENCOUNTER — Other Ambulatory Visit: Payer: Self-pay | Admitting: Internal Medicine

## 2018-11-21 ENCOUNTER — Encounter: Payer: Self-pay | Admitting: Internal Medicine

## 2018-11-21 ENCOUNTER — Ambulatory Visit (INDEPENDENT_AMBULATORY_CARE_PROVIDER_SITE_OTHER): Payer: BC Managed Care – PPO | Admitting: Neurology

## 2018-11-21 VITALS — BP 110/70 | HR 74 | Ht 67.5 in | Wt 197.0 lb

## 2018-11-21 VITALS — BP 136/82 | HR 65 | Temp 98.1°F | Ht 67.5 in | Wt 197.0 lb

## 2018-11-21 DIAGNOSIS — E611 Iron deficiency: Secondary | ICD-10-CM | POA: Diagnosis not present

## 2018-11-21 DIAGNOSIS — E559 Vitamin D deficiency, unspecified: Secondary | ICD-10-CM

## 2018-11-21 DIAGNOSIS — E538 Deficiency of other specified B group vitamins: Secondary | ICD-10-CM

## 2018-11-21 DIAGNOSIS — G35 Multiple sclerosis: Secondary | ICD-10-CM | POA: Diagnosis not present

## 2018-11-21 DIAGNOSIS — I10 Essential (primary) hypertension: Secondary | ICD-10-CM

## 2018-11-21 DIAGNOSIS — R739 Hyperglycemia, unspecified: Secondary | ICD-10-CM

## 2018-11-21 DIAGNOSIS — E785 Hyperlipidemia, unspecified: Secondary | ICD-10-CM

## 2018-11-21 DIAGNOSIS — Z0000 Encounter for general adult medical examination without abnormal findings: Secondary | ICD-10-CM

## 2018-11-21 LAB — POCT GLYCOSYLATED HEMOGLOBIN (HGB A1C): Hemoglobin A1C: 5.4 % (ref 4.0–5.6)

## 2018-11-21 LAB — IBC PANEL
Iron: 81 ug/dL (ref 42–165)
Saturation Ratios: 21.7 % (ref 20.0–50.0)
Transferrin: 267 mg/dL (ref 212.0–360.0)

## 2018-11-21 LAB — VITAMIN D 25 HYDROXY (VIT D DEFICIENCY, FRACTURES): VITD: 12.51 ng/mL — ABNORMAL LOW (ref 30.00–100.00)

## 2018-11-21 LAB — VITAMIN B12: Vitamin B-12: 187 pg/mL — ABNORMAL LOW (ref 211–911)

## 2018-11-21 MED ORDER — VITAMIN D (ERGOCALCIFEROL) 1.25 MG (50000 UNIT) PO CAPS: 50000.0000 [IU] | ORAL_CAPSULE | ORAL | 0 refills | Status: DC

## 2018-11-21 NOTE — Patient Instructions (Signed)
You look good, keep it up!  Continue your medications as you are taking.  Try adding vitamin D supplement  I will see you back in 1 year

## 2018-11-21 NOTE — Progress Notes (Signed)
Follow-up Visit   Date: 11/21/18    Russell Moses. MRN: [id number] DOB: 04-11-1970   Interim History: Russell Moses. is a 48 y.o. right-handed Caucasian male with history of hypertension, bipolar disorder, asthma, herpetic zoster, and anxiety returning to the clinic for follow-up of relapsing remitting multiple sclerosis.  The patient was accompanied to the clinic by self.  History of present illness: In 2014, he developed a tingling sensation over his head and get "swimming headed", which lasted a few seconds and spontaneously resolved. Starting in February 2015, he developed tremors of the hands and bilateral leg weakness. MRI brain in April 2015 showed white matter changes concerning for demyelinating disease.  CSF testing was also supportive of MS.  He was treated with 3-days of IVMP in May 2015 which significantly improved symptoms.  He started copaxone on 10/2013 and has been tolerating it well, besides injection site lipodystrophy.  Annual surveillance imaging has shown stable disease burden and he has not had any relapses since initial diagnosis in Spring 2015.  UPDATE 11/18/2017:  He is here for follow-up visit.  He is doing well with respect to multiple sclerosis with no new neurological symptoms.  Annual eye exam earlier this year was normal.  He has many other concerns related to energy, changes to his body with timing of meals/drinks, etc. He has brought a folder with > 75 pages of articles he would like for me to review regarding metabolism of NADH and GABA for neurological conditions.   UPDATE 11/21/2018:  He is here follow-up.  A few weeks ago, he had a spell of left hand tremor which was intermittent and self-resolved. No other new neurological complaints.   He attributes it to possible nutrient/amino acid changes.  He continues to tolerate glatiramate acetate well, and reports only mild injection site reaction.   He had good medication compliance.   MRI brain from 10/2018 is  stable, no new changes.    Medications:  Current Outpatient Medications on File Prior to Visit  Medication Sig Dispense Refill  . amLODipine (NORVASC) 5 MG tablet Take 1 tablet by mouth once daily 90 tablet 2  . aspirin EC 81 MG tablet Take 1 tablet (81 mg total) by mouth daily. 90 tablet 11  . diazepam (VALIUM) 5 MG tablet Take 1 tablet 30-min prior to MRI.  Ok to repeat at facility, if needed. 2 tablet 0  . Diclofenac Sodium (PENNSAID) 2 % SOLN Place 1 application onto the skin 2 (two) times daily. [phone] (Mobile) 1 Bottle 3  . Glatiramer Acetate (COPAXONE) 40 MG/ML SOSY INJECT 40 MG UNDER THE SKIN THREE TIMES A WEEK 36 Syringe 3  . Ibuprofen-Famotidine 800-26.6 MG TABS Take 1 tablet by mouth 3 (three) times daily. 90 tablet 3  . Omeprazole 20 MG TBDD Take by mouth.    . ranitidine (ZANTAC) 150 MG tablet Take 1 tablet (150 mg total) by mouth 2 (two) times daily. 30 tablet 0  . telmisartan (MICARDIS) 80 MG tablet Take 1 tablet by mouth once daily 90 tablet 0  . testosterone (ANDROGEL) 50 MG/5GM (1%) GEL Place 5 g onto the skin daily. 450 g 1  . traMADol (ULTRAM) 50 MG tablet Take 1 tablet (50 mg total) by mouth every 8 (eight) hours as needed. (Patient not taking: Reported on 11/21/2018) 30 tablet 0  . triamcinolone cream (KENALOG) 0.1 % APPLY CREAM EXTERNALLY TO AFFECTED AREA 4 TIMES DAILY FOR 10 DAYS AS NEEDED FOR RASH ITCHING (Patient not taking:  Reported on 11/21/2018) 30 g 1   No current facility-administered medications on file prior to visit.     Allergies:  Allergies  Allergen Reactions  . Zyrtec [Cetirizine] Other (See Comments)    Fatigued, felt bad, weak  . Penicillins Itching and Rash    Little bumps     Review of Systems:  CONSTITUTIONAL: No fevers, chills, night sweats, or weight loss.   EYES: No visual changes or eye pain ENT: No hearing changes.  No history of nose bleeds.   RESPIRATORY: No cough, wheezing and shortness of breath.   CARDIOVASCULAR: Negative  for chest pain, and palpitations.   GI: Negative for abdominal discomfort, blood in stools or black stools.  No recent change in bowel habits.   GU:  No history of incontinence.   MUSCLOSKELETAL: No history of joint pain or swelling.  No myalgias.   SKIN: Negative for lesions, rash, and itching.   ENDOCRINE: Negative for cold or heat intolerance, polydipsia or goiter.   PSYCH: No depression or anxiety symptoms.   NEURO: As Above.   Vital Signs:  BP 110/70   Pulse 74   Ht 5' 7.5" (1.715 m)   Wt 197 lb (89.4 kg)   SpO2 98%   BMI 30.40 kg/m   Neurological Exam: MENTAL STATUS including orientation to time, place, person, recent and remote memory, attention span and concentration, language, and fund of knowledge is normal.  Speech is not dysarthric.  CRANIAL NERVES: No visual field defects. Pupils equal round and reactive to light.  Normal conjugate, extra-ocular eye movements in all directions of gaze.  No ptosis. Normal facial sensation.  Face is symmetric. Palate elevates symmetrically.  Tongue is midline.  MOTOR:  Motor strength is 5/5 in all extremities.  No pronator drift.  Tone is normal.    MSRs:  Reflexes are 2+/4 throughout.  SENSORY:  Intact to vibration.  COORDINATION/GAIT:  Normal finger-to- nose-finger.  Intact rapid alternating movements bilaterally.  Gait narrow based and stable. Stressed and tandem gait intact.    Data: MRI brain wwo contrast 11/03/2018:  No new lesions compared with December 02, 2017.  No lesions with acute features.   MRI brain wwo contrast 11/28/2016:   Essentially stable findings of multiple sclerosis. No acute intracranial abnormality.  MRI brain wwo contrast 11/17/2015:  No significant changes since 12/16/2014.  No abnormal enhancement.  MRI brain wwo contrast 12/16/2014: 1. Unchanged supratentorial white matter lesions. No definite new lesions. No abnormal enhancement or restricted diffusion. 2. Unchanged focus of encephalomalacia  within the cortex of the left frontal lobe, may represent an area of old infarct. 3. Paransal sinus inflammatory change, correlate clinically.  MRI brain wwo contrast 12/25/2013: Multiple lesoins in the periventricular and juxtacortical white matter regions bilaterally. There is no longer enhancement or diffusion abnormality. No new lesions MRI cervical spine wwo contrast: Normal  MRI brain wwo contrast 05/28/2013:  Multiple lesions in the periventricular and juxtacortical white matter bilaterally, one of which enhances.  The pattern and distribution of these lesions suggest demyelinating disease such as multiple sclerosis.  MRI cervical spine wwo contrast 06/04/2013:  Normal MRI cervical spine   EMG right upper and lower extremity 05/14/2013:  There is electrophysiological evidence of an intraspinal canal lesion (i.e. radiculopathy) affecting the L5 nerve root/segment bilaterally. Overall, these changes are mild in degree electrically and worse on the right side. There is no evidence of a generalized sensorimotor polyneuropathy, cervical radiculopathy or diffuse myopathy.   CSF 06/22/2013: R0 W0 G61  P27  ACE 4, Lyme neg, MBP 61, IgG 1.6 OCB The patient's CSF contains >5 well defined gamma restriction bands that are also present in the patient's  corresponding serum sample, but some bands in the CSF are more prominent.  CSF cytology with few mononuclear cells  Lab Results  Component Value Date   TSH 1.69 05/20/2018   Lab Results  Component Value Date   VITAMINB12 321 05/17/2017    IMPRESSION/PLAN: Relapsing-remitting multiple sclerosis, diagnosed in May 2015 manifesting with generalized fatigue and tremors. Clinically stable and imaging with no evidence of disease progression.   MRI brain shows periventricular and juxtacortical white matter bilaterally, which remains stable.  He has not had relapse on glatiramer acetate 40mg  SQ three times daily since 2015.  Continue current regimen.  I  recommend that he take vitamin D.  Return to clinic in 1 year  Greater than 50% of this 15 minute visit was spent in counseling, explanation of diagnosis, planning of further management, and coordination of care.   Thank you for allowing me to participate in patient's care.  If I can answer any additional questions, I would be pleased to do so.    Sincerely,    Keira Bohlin K. Allena KatzPatel, DO

## 2018-11-21 NOTE — Patient Instructions (Signed)
Your A1c was OK today  Please continue all other medications as before, and refills have been done if requested.  Please have the pharmacy call with any other refills you may need.  Please continue your efforts at being more active, low cholesterol diet, and weight control..  Please keep your appointments with your specialists as you may have planned  Please return in 6 months, or sooner if needed, with Lab testing done 3-5 days before  

## 2018-11-21 NOTE — Progress Notes (Signed)
Subjective:    Patient ID: Russell Moses., male    DOB: 08-18-1970, 48 y.o.   MRN: [id number]  HPI  Here to f/u; overall doing ok,  Pt denies chest pain, increasing sob or doe, wheezing, orthopnea, PND, increased LE swelling, palpitations, dizziness or syncope.  Pt denies new neurological symptoms such as new headache, or facial or extremity weakness or numbness.  Pt denies polydipsia, polyuria, or low sugar episode.  Pt states overall good compliance with meds, mostly trying to follow appropriate diet, with wt overall stable,  but little exercise however. Wt Readings from Last 3 Encounters:  11/21/18 197 lb (89.4 kg)  11/21/18 197 lb (89.4 kg)  11/19/17 198 lb (89.8 kg)   Past Medical History:  Diagnosis Date  . ASTHMA, CHILDHOOD 04/18/2009   Qualifier: Diagnosis of  By: Jonny Ruiz MD, Len Blalock   . BIPOLAR DISORDER UNSPECIFIED 04/18/2009   Qualifier: Diagnosis of  By: Jonny Ruiz MD, Len Blalock   . GERD (gastroesophageal reflux disease) 07/30/2013  . Hypertension   . Insomnia   . Multiple sclerosis (HCC) 2015  . Psoriasis 12/15/2013  . Ventral hernia without obstruction or gangrene 12/15/2013   Past Surgical History:  Procedure Laterality Date  . HERNIA REPAIR      reports that he has never smoked. He has never used smokeless tobacco. He reports that he does not drink alcohol or use drugs. family history includes COPD in his sister; Diabetes in his father; Fibromyalgia in his sister; Healthy in his sister; Heart disease in his father; Hypertension in his father and mother; Parkinson's disease in his father; Stroke in his father. Allergies  Allergen Reactions  . Zyrtec [Cetirizine] Other (See Comments)    Fatigued, felt bad, weak  . Penicillins Itching and Rash    Little bumps   Current Outpatient Medications on File Prior to Visit  Medication Sig Dispense Refill  . amLODipine (NORVASC) 5 MG tablet Take 1 tablet by mouth once daily 90 tablet 2  . aspirin EC 81 MG tablet Take 1 tablet (81 mg  total) by mouth daily. 90 tablet 11  . diazepam (VALIUM) 5 MG tablet Take 1 tablet 30-min prior to MRI.  Ok to repeat at facility, if needed. 2 tablet 0  . Diclofenac Sodium (PENNSAID) 2 % SOLN Place 1 application onto the skin 2 (two) times daily. [phone] (Mobile) 1 Bottle 3  . Glatiramer Acetate (COPAXONE) 40 MG/ML SOSY INJECT 40 MG UNDER THE SKIN THREE TIMES A WEEK 36 Syringe 3  . Ibuprofen-Famotidine 800-26.6 MG TABS Take 1 tablet by mouth 3 (three) times daily. 90 tablet 3  . Omeprazole 20 MG TBDD Take by mouth.    . ranitidine (ZANTAC) 150 MG tablet Take 1 tablet (150 mg total) by mouth 2 (two) times daily. 30 tablet 0  . telmisartan (MICARDIS) 80 MG tablet Take 1 tablet by mouth once daily 90 tablet 0  . testosterone (ANDROGEL) 50 MG/5GM (1%) GEL Place 5 g onto the skin daily. 450 g 1  . traMADol (ULTRAM) 50 MG tablet Take 1 tablet (50 mg total) by mouth every 8 (eight) hours as needed. 30 tablet 0  . triamcinolone cream (KENALOG) 0.1 % APPLY CREAM EXTERNALLY TO AFFECTED AREA 4 TIMES DAILY FOR 10 DAYS AS NEEDED FOR RASH ITCHING 30 g 1   No current facility-administered medications on file prior to visit.    Review of Systems   Constitutional: Negative for other unusual diaphoresis or sweats HENT: Negative for ear discharge or  swelling Eyes: Negative for other worsening visual disturbances Respiratory: Negative for stridor or other swelling  Gastrointestinal: Negative for worsening distension or other blood Genitourinary: Negative for retention or other urinary change Musculoskeletal: Negative for other MSK pain or swelling Skin: Negative for color change or other new lesions Neurological: Negative for worsening tremors and other numbness  Psychiatric/Behavioral: Negative for worsening agitation or other fatigue All otherwise neg per pt POCT glycosylated hemoglobin (Hb A1C) - today Order: [id number] Status:  Final result Visible to patient:  No (not released) Dx:   Hyperglycemia  Ref Range & Units 10:19 50mo ago 57yr ago 22yr ago  Hemoglobin A1C 4.0 - 5.6 % 5.4  5.8 R, CM  5.9 R, CM  6.1 R, CM           Objective:   Physical Exam BP 136/82   Pulse 65   Temp 98.1 F (36.7 C) (Oral)   Ht 5' 7.5" (1.715 m)   Wt 197 lb (89.4 kg)   SpO2 96%   BMI 30.40 kg/m  VS noted,  Constitutional: Pt appears in NAD HENT: Head: NCAT.  Right Ear: External ear normal.  Left Ear: External ear normal.  Eyes: . Pupils are equal, round, and reactive to light. Conjunctivae and EOM are normal Nose: without d/c or deformity Neck: Neck supple. Gross normal ROM Cardiovascular: Normal rate and regular rhythm.   Pulmonary/Chest: Effort normal and breath sounds without rales or wheezing.  Abd:  Soft, NT, ND, + BS, no organomegaly Neurological: Pt is alert. At baseline orientation, motor grossly intact Skin: Skin is warm. No rashes, other new lesions, no LE edema Psychiatric: Pt behavior is normal without agitation  All otherwise neg per pt Lab Results  Component Value Date   WBC 4.7 05/20/2018   HGB 14.0 05/20/2018   HCT 41.1 05/20/2018   PLT 147.0 (L) 05/20/2018   GLUCOSE 118 (H) 05/20/2018   CHOL 137 05/20/2018   TRIG 103.0 05/20/2018   HDL 32.50 (L) 05/20/2018   LDLCALC 83 05/20/2018   ALT 38 05/20/2018   AST 23 05/20/2018   NA 139 05/20/2018   K 4.3 05/20/2018   CL 105 05/20/2018   CREATININE 0.84 05/20/2018   BUN 12 05/20/2018   CO2 26 05/20/2018   TSH 1.69 05/20/2018   PSA 0.47 05/20/2018   HGBA1C 5.4 11/21/2018       Assessment & Plan:

## 2018-11-22 ENCOUNTER — Encounter: Payer: Self-pay | Admitting: Internal Medicine

## 2018-11-22 NOTE — Assessment & Plan Note (Signed)
stable overall by history and exam, recent data reviewed with pt, and pt to continue medical treatment as before,  to f/u any worsening symptoms or concerns  

## 2018-11-22 NOTE — Assessment & Plan Note (Addendum)
stable overall by history and exam, recent data reviewed with pt, and pt to continue medical treatment as before,  to f/u any worsening symptoms or concerns  

## 2018-11-24 ENCOUNTER — Other Ambulatory Visit: Payer: Self-pay

## 2018-11-24 ENCOUNTER — Ambulatory Visit (INDEPENDENT_AMBULATORY_CARE_PROVIDER_SITE_OTHER): Payer: BC Managed Care – PPO

## 2018-11-24 DIAGNOSIS — E538 Deficiency of other specified B group vitamins: Secondary | ICD-10-CM

## 2018-11-24 MED ORDER — CYANOCOBALAMIN 1000 MCG/ML IJ SOLN
1000.0000 ug | Freq: Once | INTRAMUSCULAR | Status: AC
  Administered 2018-11-24: 1000 ug via INTRAMUSCULAR

## 2018-11-24 NOTE — Progress Notes (Signed)
Medical screening examination/treatment/procedure(s) were performed by non-physician practitioner and as supervising physician I was immediately available for consultation/collaboration. I agree with above. Kearsten Ginther, MD   

## 2018-12-07 ENCOUNTER — Encounter: Payer: Self-pay | Admitting: Internal Medicine

## 2018-12-10 ENCOUNTER — Encounter: Payer: Self-pay | Admitting: Internal Medicine

## 2018-12-22 ENCOUNTER — Ambulatory Visit (INDEPENDENT_AMBULATORY_CARE_PROVIDER_SITE_OTHER): Payer: BC Managed Care – PPO

## 2018-12-22 ENCOUNTER — Other Ambulatory Visit: Payer: Self-pay

## 2018-12-22 DIAGNOSIS — E538 Deficiency of other specified B group vitamins: Secondary | ICD-10-CM | POA: Diagnosis not present

## 2018-12-22 MED ORDER — CYANOCOBALAMIN 1000 MCG/ML IJ SOLN
1000.0000 ug | Freq: Once | INTRAMUSCULAR | Status: AC
  Administered 2018-12-22: 1000 ug via INTRAMUSCULAR

## 2018-12-22 NOTE — Progress Notes (Signed)
Medical screening examination/treatment/procedure(s) were performed by non-physician practitioner and as supervising physician I was immediately available for consultation/collaboration. I agree with above. James [name], MD   

## 2018-12-29 ENCOUNTER — Ambulatory Visit: Payer: BC Managed Care – PPO

## 2019-01-19 ENCOUNTER — Ambulatory Visit (INDEPENDENT_AMBULATORY_CARE_PROVIDER_SITE_OTHER): Payer: BC Managed Care – PPO

## 2019-01-19 ENCOUNTER — Other Ambulatory Visit: Payer: Self-pay

## 2019-01-19 DIAGNOSIS — E538 Deficiency of other specified B group vitamins: Secondary | ICD-10-CM | POA: Diagnosis not present

## 2019-01-19 MED ORDER — CYANOCOBALAMIN 1000 MCG/ML IJ SOLN
1000.0000 ug | Freq: Once | INTRAMUSCULAR | Status: AC
  Administered 2019-01-19: 1000 ug via INTRAMUSCULAR

## 2019-01-19 NOTE — Progress Notes (Signed)
Medical screening examination/treatment/procedure(s) were performed by non-physician practitioner and as supervising physician I was immediately available for consultation/collaboration. I agree with above. James [name], MD   

## 2019-02-23 ENCOUNTER — Other Ambulatory Visit: Payer: Self-pay

## 2019-02-23 ENCOUNTER — Ambulatory Visit (INDEPENDENT_AMBULATORY_CARE_PROVIDER_SITE_OTHER): Payer: BC Managed Care – PPO | Admitting: *Deleted

## 2019-02-23 DIAGNOSIS — E538 Deficiency of other specified B group vitamins: Secondary | ICD-10-CM

## 2019-02-23 MED ORDER — CYANOCOBALAMIN 1000 MCG/ML IJ SOLN
1000.0000 ug | Freq: Once | INTRAMUSCULAR | Status: AC
  Administered 2019-02-23: 1000 ug via INTRAMUSCULAR

## 2019-02-24 ENCOUNTER — Other Ambulatory Visit: Payer: Self-pay | Admitting: Neurology

## 2019-02-28 NOTE — Progress Notes (Signed)
Medical screening examination/treatment/procedure(s) were performed by non-physician practitioner and as supervising physician I was immediately available for consultation/collaboration. I agree with above. Neriah Brott, MD   

## 2019-03-17 ENCOUNTER — Telehealth: Payer: Self-pay

## 2019-03-17 NOTE — Telephone Encounter (Signed)
Johny Sax, RN with [facility] calling and states that she triaged the patient for chest pain and dizziness. States that patient states he is having some vague, throbbing pain on the left side of his chest. States that he is having some dizziness as well. Triage states that he told her that he believes the medication is causing the dizziness. Has a diagnosis of MS and hypertension. Triage advised that he go to the ED. States that patient refused to go to ED and that he has an appointment tomorrow (03/18/2019) for this issue. Please advise.

## 2019-03-18 ENCOUNTER — Encounter: Payer: Self-pay | Admitting: Internal Medicine

## 2019-03-18 ENCOUNTER — Ambulatory Visit: Payer: BC Managed Care – PPO | Admitting: Internal Medicine

## 2019-03-18 ENCOUNTER — Other Ambulatory Visit: Payer: Self-pay

## 2019-03-18 ENCOUNTER — Ambulatory Visit (INDEPENDENT_AMBULATORY_CARE_PROVIDER_SITE_OTHER): Payer: BC Managed Care – PPO

## 2019-03-18 VITALS — BP 162/80 | HR 54 | Temp 98.5°F | Ht 67.5 in | Wt 199.0 lb

## 2019-03-18 DIAGNOSIS — I10 Essential (primary) hypertension: Secondary | ICD-10-CM

## 2019-03-18 DIAGNOSIS — E559 Vitamin D deficiency, unspecified: Secondary | ICD-10-CM | POA: Diagnosis not present

## 2019-03-18 DIAGNOSIS — R079 Chest pain, unspecified: Secondary | ICD-10-CM | POA: Insufficient documentation

## 2019-03-18 DIAGNOSIS — J309 Allergic rhinitis, unspecified: Secondary | ICD-10-CM

## 2019-03-18 DIAGNOSIS — E538 Deficiency of other specified B group vitamins: Secondary | ICD-10-CM

## 2019-03-18 HISTORY — DX: Vitamin D deficiency, unspecified: E55.9

## 2019-03-18 LAB — BASIC METABOLIC PANEL
BUN: 11 mg/dL (ref 6–23)
CO2: 27 mEq/L (ref 19–32)
Calcium: 10 mg/dL (ref 8.4–10.5)
Chloride: 105 mEq/L (ref 96–112)
Creatinine, Ser: 0.92 mg/dL (ref 0.40–1.50)
GFR: 87.14 mL/min (ref >60.00)
Glucose, Bld: 113 mg/dL — ABNORMAL HIGH (ref 70–99)
Potassium: 4.4 mEq/L (ref 3.5–5.1)
Sodium: 137 mEq/L (ref 135–145)

## 2019-03-18 LAB — CBC WITH DIFFERENTIAL/PLATELET
Basophils Absolute: 0 10*3/uL (ref 0.0–0.1)
Basophils Relative: 0.7 % (ref 0.0–3.0)
Eosinophils Absolute: 0.2 10*3/uL (ref 0.0–0.7)
Eosinophils Relative: 3.8 % (ref 0.0–5.0)
HCT: 43.3 % (ref 39.0–52.0)
Hemoglobin: 14.6 g/dL (ref 13.0–17.0)
Lymphocytes Relative: 30 % (ref 12.0–46.0)
Lymphs Abs: 1.3 10*3/uL (ref 0.7–4.0)
MCHC: 33.6 g/dL (ref 30.0–36.0)
MCV: 87.8 fl (ref 78.0–100.0)
Monocytes Absolute: 0.3 10*3/uL (ref 0.1–1.0)
Monocytes Relative: 7.7 % (ref 3.0–12.0)
Neutro Abs: 2.5 10*3/uL (ref 1.4–7.7)
Neutrophils Relative %: 57.8 % (ref 43.0–77.0)
Platelets: 192 10*3/uL (ref 150.0–400.0)
RBC: 4.93 Mil/uL (ref 4.22–5.81)
RDW: 13.9 % (ref 11.5–15.5)
WBC: 4.4 10*3/uL (ref 4.0–10.5)

## 2019-03-18 LAB — TROPONIN I (HIGH SENSITIVITY): High Sens Troponin I: 3 ng/L (ref 2–17)

## 2019-03-18 LAB — HEPATIC FUNCTION PANEL
ALT: 38 U/L (ref 0–53)
AST: 21 U/L (ref 0–37)
Albumin: 4 g/dL (ref 3.5–5.2)
Alkaline Phosphatase: 109 U/L (ref 39–117)
Bilirubin, Direct: 0.1 mg/dL (ref 0.0–0.3)
Total Bilirubin: 0.3 mg/dL (ref 0.2–1.2)
Total Protein: 7.2 g/dL (ref 6.0–8.3)

## 2019-03-18 MED ORDER — AMLODIPINE BESYLATE 10 MG PO TABS: 10.0000 mg | ORAL_TABLET | Freq: Every day | ORAL | 3 refills | Status: DC

## 2019-03-18 MED ORDER — METHYLPREDNISOLONE ACETATE 80 MG/ML IJ SUSP
80.0000 mg | Freq: Once | INTRAMUSCULAR | Status: AC
  Administered 2019-03-18: 17:00:00 80 mg via INTRAMUSCULAR

## 2019-03-18 NOTE — Assessment & Plan Note (Signed)
Cont oral replacement 

## 2019-03-18 NOTE — Patient Instructions (Signed)
OK to increase the amlodipine to 10 mg per day  Your EKG was OK today  You had the steroid shot today  Please consider the Equate version of claritin or allegra, and even Nasacort every day for allergies  Please continue all other medications as before, and refills have been done if requested.  Please have the pharmacy call with any other refills you may need.  Please continue your efforts at being more active, low cholesterol diet, and weight control.  Please keep your appointments with your specialists as you may have planned  You will be contacted regarding the referral for: stress test  Please go to the XRAY Department in the first floor for the x-ray testing  Please go to the LAB at the blood drawing area for the tests to be done  You will be contacted by phone if any changes need to be made immediately.  Otherwise, you will receive a letter about your results with an explanation, but please check with MyChart first.

## 2019-03-18 NOTE — Addendum Note (Signed)
Addended by: Milus Mallick on: 03/18/2019 05:06 PM   Modules accepted: Orders

## 2019-03-18 NOTE — Addendum Note (Signed)
Addended by: Trellis Paganini D on: 03/18/2019 10:13 AM   Modules accepted: Orders

## 2019-03-18 NOTE — Assessment & Plan Note (Signed)
Mild to mod, for depomedrol IM 80, and otc allegara and nasacort,  to f/u any worsening symptoms or concerns

## 2019-03-18 NOTE — Progress Notes (Addendum)
Subjective:    Patient ID: Russell Moses., male    DOB: [birth year], 49 y.o.   MRN: [id number]  HPI  Here with 2 days onset acute onset left CP throbbing brief intermittent mild to mod, no radiation, no diaphoresis, n/v, palps, or syncope or dizziness but has had ongoing sinus congestion.  Pt denies fever, wt loss, night sweats, loss of appetite, or other constitutional symptoms  Did take the Vitamin D gel cap and wondering if had something to do with the head congestion.  Pt denies wheezing, orthopnea, PND, increased LE swelling.  Gets temp check at work every day - normal temp.  No ST or cough.  Does have several wks ongoing nasal allergy symptoms with clearish congestion, itch and sneezing, without fever, pain, ST, cough, swelling or wheezing, but does not take anything as zyrtec caused fatigue and sedation.  Bp at home variable with time of day, ranging from 132/70 - 148/74. Denies worsening reflux, abd pain, dysphagia, n/v, bowel change or blood.  CP is non pleuritic, non positional, non exertional.  Has no recent ecg, cxr, stress test.  No prior hx of cad. Father had CABG in his 27s.  No heavy lifting at [employer] job. Past Medical History:  Diagnosis Date  . ASTHMA, CHILDHOOD 04/18/2009   Qualifier: Diagnosis of  By: Jonny Ruiz MD, Len Blalock   . BIPOLAR DISORDER UNSPECIFIED 04/18/2009   Qualifier: Diagnosis of  By: Jonny Ruiz MD, Len Blalock   . GERD (gastroesophageal reflux disease) 07/30/2013  . Hypertension   . Insomnia   . Multiple sclerosis (HCC) 2015  . Psoriasis 12/15/2013  . Ventral hernia without obstruction or gangrene 12/15/2013   Past Surgical History:  Procedure Laterality Date  . HERNIA REPAIR      reports that he has never smoked. He has never used smokeless tobacco. He reports that he does not drink alcohol or use drugs. family history includes COPD in his sister; Diabetes in his father; Fibromyalgia in his sister; Healthy in his sister; Heart disease in his father; Hypertension in his  father and mother; Parkinson's disease in his father; Stroke in his father. Allergies  Allergen Reactions  . Zyrtec [Cetirizine] Other (See Comments)    Fatigued, felt bad, weak  . Penicillins Itching and Rash    Little bumps   Current Outpatient Medications on File Prior to Visit  Medication Sig Dispense Refill  . amLODipine (NORVASC) 5 MG tablet Take 1 tablet by mouth once daily 90 tablet 2  . aspirin EC 81 MG tablet Take 1 tablet (81 mg total) by mouth daily. 90 tablet 11  . GLATOPA 40 MG/ML SOSY INJECT 1 SYRINGE SUBCUTANEOUSLY THREE TIMES A WEEK 12 mL 0  . telmisartan (MICARDIS) 80 MG tablet Take 1 tablet by mouth once daily 90 tablet 0  . testosterone (ANDROGEL) 50 MG/5GM (1%) GEL Place 5 g onto the skin daily. 450 g 1  . diazepam (VALIUM) 5 MG tablet Take 1 tablet 30-min prior to MRI.  Ok to repeat at facility, if needed. (Patient not taking: Reported on 03/18/2019) 2 tablet 0  . Diclofenac Sodium (PENNSAID) 2 % SOLN Place 1 application onto the skin 2 (two) times daily. [phone] (Mobile) (Patient not taking: Reported on 03/18/2019) 1 Bottle 3  . Ibuprofen-Famotidine 800-26.6 MG TABS Take 1 tablet by mouth 3 (three) times daily. (Patient not taking: Reported on 03/18/2019) 90 tablet 3  . Omeprazole 20 MG TBDD Take by mouth.    . ranitidine (ZANTAC) 150 MG tablet  Take 1 tablet (150 mg total) by mouth 2 (two) times daily. (Patient not taking: Reported on 03/18/2019) 30 tablet 0  . traMADol (ULTRAM) 50 MG tablet Take 1 tablet (50 mg total) by mouth every 8 (eight) hours as needed. (Patient not taking: Reported on 03/18/2019) 30 tablet 0  . triamcinolone cream (KENALOG) 0.1 % APPLY CREAM EXTERNALLY TO AFFECTED AREA 4 TIMES DAILY FOR 10 DAYS AS NEEDED FOR RASH ITCHING (Patient not taking: Reported on 03/18/2019) 30 g 1  . Vitamin D, Ergocalciferol, (DRISDOL) 1.25 MG (50000 UT) CAPS capsule Take 1 capsule (50,000 Units total) by mouth every 7 (seven) days. (Patient not taking: Reported on  03/18/2019) 12 capsule 0   No current facility-administered medications on file prior to visit.   Review of Systems All otherwise neg per pt     Objective:   Physical Exam BP (!) 162/80   Pulse (!) 54   Temp 98.5 F (36.9 C)   Ht 5' 7.5" (1.715 m)   Wt 199 lb (90.3 kg)   SpO2 98%   BMI 30.71 kg/m  VS noted,  Constitutional: Pt appears in NAD HENT: Head: NCAT.  Right Ear: External ear normal.  Left Ear: External ear normal.  Eyes: . Pupils are equal, round, and reactive to light. Conjunctivae and EOM are normal Nose: without d/c or deformity Neck: Neck supple. Gross normal ROM Cardiovascular: Normal rate and regular rhythm.   Pulmonary/Chest: Effort normal and breath sounds without rales or wheezing.  Abd:  Soft, NT, ND, + BS, no organomegaly Neurological: Pt is alert. At baseline orientation, motor grossly intact Skin: Skin is warm. No rashes, other new lesions, no LE edema Psychiatric: Pt behavior is normal without agitation  All otherwise neg per pt Lab Results  Component Value Date   WBC 4.7 05/20/2018   HGB 14.0 05/20/2018   HCT 41.1 05/20/2018   PLT 147.0 (L) 05/20/2018   GLUCOSE 118 (H) 05/20/2018   CHOL 137 05/20/2018   TRIG 103.0 05/20/2018   HDL 32.50 (L) 05/20/2018   LDLCALC 83 05/20/2018   ALT 38 05/20/2018   AST 23 05/20/2018   NA 139 05/20/2018   K 4.3 05/20/2018   CL 105 05/20/2018   CREATININE 0.84 05/20/2018   BUN 12 05/20/2018   CO2 26 05/20/2018   TSH 1.69 05/20/2018   PSA 0.47 05/20/2018   HGBA1C 5.4 11/21/2018    ECG today I have personally interpreted Sinus bradycardia 51      Assessment & Plan:

## 2019-03-18 NOTE — Addendum Note (Signed)
Addended by: Merrilyn Puma on: 03/18/2019 10:13 AM   Modules accepted: Orders

## 2019-03-18 NOTE — Assessment & Plan Note (Signed)
Uncontrolled, to increase the amlodipine 10 qd

## 2019-03-18 NOTE — Assessment & Plan Note (Addendum)
Atypical, cant r/o cardiac, for ecg, labs, cxr and refer for stress testing  I spent 40 minutes preparing to see the patient by review of recent labs, imaging and procedures, obtaining and reviewing separately obtained history, communicating with the patient and family or caregiver, ordering medications, tests or procedures, and documenting clinical information in the EHR including the differential Dx, treatment, and any further evaluation and other management of CP, allergic rhinitis, vit d and b12 deficiency, and HTN

## 2019-03-18 NOTE — Assessment & Plan Note (Signed)
Cont montlhly IM

## 2019-03-23 ENCOUNTER — Ambulatory Visit (INDEPENDENT_AMBULATORY_CARE_PROVIDER_SITE_OTHER): Payer: BC Managed Care – PPO

## 2019-03-23 ENCOUNTER — Other Ambulatory Visit: Payer: Self-pay

## 2019-03-23 DIAGNOSIS — E538 Deficiency of other specified B group vitamins: Secondary | ICD-10-CM

## 2019-03-23 MED ORDER — CYANOCOBALAMIN 1000 MCG/ML IJ SOLN
1000.0000 ug | Freq: Once | INTRAMUSCULAR | Status: AC
  Administered 2019-03-23: 1000 ug via INTRAMUSCULAR

## 2019-03-23 NOTE — Progress Notes (Signed)
I have reviewed and agree.

## 2019-04-07 ENCOUNTER — Other Ambulatory Visit: Payer: Self-pay

## 2019-04-07 MED ORDER — GLATIRAMER ACETATE 40 MG/ML ~~LOC~~ SOSY: PREFILLED_SYRINGE | SUBCUTANEOUS | 0 refills | Status: DC

## 2019-04-10 ENCOUNTER — Telehealth (HOSPITAL_COMMUNITY): Payer: Self-pay | Admitting: *Deleted

## 2019-04-10 ENCOUNTER — Encounter (HOSPITAL_COMMUNITY): Payer: Self-pay | Admitting: *Deleted

## 2019-04-10 NOTE — Telephone Encounter (Signed)
Sent via MyChart instructions for upcoming stress test on 04/13/19 @ 8:00. Daneil Dolin

## 2019-04-13 ENCOUNTER — Other Ambulatory Visit: Payer: Self-pay

## 2019-04-13 ENCOUNTER — Ambulatory Visit (HOSPITAL_COMMUNITY): Payer: BC Managed Care – PPO | Attending: Internal Medicine

## 2019-04-13 DIAGNOSIS — R079 Chest pain, unspecified: Secondary | ICD-10-CM

## 2019-04-13 LAB — MYOCARDIAL PERFUSION IMAGING
LV dias vol: 86 mL (ref 62–150)
LV sys vol: 36 mL
Peak HR: 93 {beats}/min
Rest HR: 51 {beats}/min
SDS: 3
SRS: 0
SSS: 3
TID: 0.96

## 2019-04-13 MED ORDER — REGADENOSON 0.4 MG/5ML IV SOLN
0.4000 mg | Freq: Once | INTRAVENOUS | Status: AC
  Administered 2019-04-13: 0.4 mg via INTRAVENOUS

## 2019-04-13 MED ORDER — TECHNETIUM TC 99M TETROFOSMIN IV KIT
10.1000 | PACK | Freq: Once | INTRAVENOUS | Status: AC | PRN
  Administered 2019-04-13: 10.1 via INTRAVENOUS
  Filled 2019-04-13: qty 11

## 2019-04-13 MED ORDER — TECHNETIUM TC 99M TETROFOSMIN IV KIT
31.6000 | PACK | Freq: Once | INTRAVENOUS | Status: AC | PRN
  Administered 2019-04-13: 31.6 via INTRAVENOUS
  Filled 2019-04-13: qty 32

## 2019-04-20 ENCOUNTER — Ambulatory Visit (INDEPENDENT_AMBULATORY_CARE_PROVIDER_SITE_OTHER): Payer: BC Managed Care – PPO | Admitting: *Deleted

## 2019-04-20 ENCOUNTER — Other Ambulatory Visit: Payer: Self-pay

## 2019-04-20 ENCOUNTER — Encounter: Payer: Self-pay | Admitting: Internal Medicine

## 2019-04-20 DIAGNOSIS — E538 Deficiency of other specified B group vitamins: Secondary | ICD-10-CM

## 2019-04-20 MED ORDER — CYANOCOBALAMIN 1000 MCG/ML IJ SOLN
1000.0000 ug | Freq: Once | INTRAMUSCULAR | Status: AC
  Administered 2019-04-20: 1000 ug via INTRAMUSCULAR

## 2019-04-20 NOTE — Progress Notes (Addendum)
I have reviewed and agree.

## 2019-05-25 ENCOUNTER — Telehealth: Payer: Self-pay | Admitting: Internal Medicine

## 2019-05-25 ENCOUNTER — Ambulatory Visit: Payer: BC Managed Care – PPO | Admitting: Internal Medicine

## 2019-05-25 ENCOUNTER — Other Ambulatory Visit (INDEPENDENT_AMBULATORY_CARE_PROVIDER_SITE_OTHER): Payer: BC Managed Care – PPO

## 2019-05-25 ENCOUNTER — Encounter: Payer: Self-pay | Admitting: Internal Medicine

## 2019-05-25 ENCOUNTER — Other Ambulatory Visit: Payer: Self-pay

## 2019-05-25 VITALS — BP 130/72 | HR 53 | Temp 98.0°F | Ht 67.0 in | Wt 199.2 lb

## 2019-05-25 DIAGNOSIS — R739 Hyperglycemia, unspecified: Secondary | ICD-10-CM | POA: Diagnosis not present

## 2019-05-25 DIAGNOSIS — Z0000 Encounter for general adult medical examination without abnormal findings: Secondary | ICD-10-CM

## 2019-05-25 DIAGNOSIS — E538 Deficiency of other specified B group vitamins: Secondary | ICD-10-CM | POA: Diagnosis not present

## 2019-05-25 DIAGNOSIS — E559 Vitamin D deficiency, unspecified: Secondary | ICD-10-CM

## 2019-05-25 LAB — LIPID PANEL
Cholesterol: 99 mg/dL (ref 0–200)
HDL: 27.5 mg/dL — ABNORMAL LOW
LDL Cholesterol: 57 mg/dL (ref 0–99)
NonHDL: 71.59
Total CHOL/HDL Ratio: 4
Triglycerides: 75 mg/dL (ref 0.0–149.0)
VLDL: 15 mg/dL (ref 0.0–40.0)

## 2019-05-25 LAB — CBC WITH DIFFERENTIAL/PLATELET
Basophils Absolute: 0 K/uL (ref 0.0–0.1)
Basophils Relative: 0.4 % (ref 0.0–3.0)
Eosinophils Absolute: 0.2 K/uL (ref 0.0–0.7)
Eosinophils Relative: 3.5 % (ref 0.0–5.0)
HCT: 40.4 % (ref 39.0–52.0)
Hemoglobin: 13.7 g/dL (ref 13.0–17.0)
Lymphocytes Relative: 30.8 % (ref 12.0–46.0)
Lymphs Abs: 1.6 K/uL (ref 0.7–4.0)
MCHC: 33.9 g/dL (ref 30.0–36.0)
MCV: 86.7 fl (ref 78.0–100.0)
Monocytes Absolute: 0.5 K/uL (ref 0.1–1.0)
Monocytes Relative: 9.4 % (ref 3.0–12.0)
Neutro Abs: 2.9 K/uL (ref 1.4–7.7)
Neutrophils Relative %: 55.9 % (ref 43.0–77.0)
Platelets: 179 K/uL (ref 150.0–400.0)
RBC: 4.65 Mil/uL (ref 4.22–5.81)
RDW: 14 % (ref 11.5–15.5)
WBC: 5.1 K/uL (ref 4.0–10.5)

## 2019-05-25 LAB — URINALYSIS, ROUTINE W REFLEX MICROSCOPIC
Bilirubin Urine: NEGATIVE
Hgb urine dipstick: NEGATIVE
Ketones, ur: NEGATIVE
Leukocytes,Ua: NEGATIVE
Nitrite: NEGATIVE
RBC / HPF: NONE SEEN
Specific Gravity, Urine: <=1.005 — AB (ref 1.000–1.030)
Total Protein, Urine: NEGATIVE
Urine Glucose: NEGATIVE
Urobilinogen, UA: 0.2 (ref 0.0–1.0)
WBC, UA: NONE SEEN
pH: 6 (ref 5.0–8.0)

## 2019-05-25 LAB — BASIC METABOLIC PANEL
BUN: 6 mg/dL (ref 6–23)
CO2: 24 mEq/L (ref 19–32)
Calcium: 8.8 mg/dL (ref 8.4–10.5)
Chloride: 107 mEq/L (ref 96–112)
Creatinine, Ser: 0.69 mg/dL (ref 0.40–1.50)
GFR: 121.36 mL/min (ref >60.00)
Glucose, Bld: 113 mg/dL — ABNORMAL HIGH (ref 70–99)
Potassium: 3.7 mEq/L (ref 3.5–5.1)
Sodium: 139 mEq/L (ref 135–145)

## 2019-05-25 LAB — HEPATIC FUNCTION PANEL
ALT: 48 U/L (ref 0–53)
AST: 30 U/L (ref 0–37)
Albumin: 3.8 g/dL (ref 3.5–5.2)
Alkaline Phosphatase: 100 U/L (ref 39–117)
Bilirubin, Direct: 0.1 mg/dL (ref 0.0–0.3)
Total Bilirubin: 0.3 mg/dL (ref 0.2–1.2)
Total Protein: 6.4 g/dL (ref 6.0–8.3)

## 2019-05-25 LAB — HEMOGLOBIN A1C: Hgb A1c MFr Bld: 5.8 % (ref 4.6–6.5)

## 2019-05-25 LAB — VITAMIN B12: Vitamin B-12: 249 pg/mL (ref 211–911)

## 2019-05-25 LAB — TSH: TSH: 1.22 u[IU]/mL (ref 0.35–4.50)

## 2019-05-25 NOTE — Assessment & Plan Note (Signed)

## 2019-05-25 NOTE — Telephone Encounter (Signed)
    Patient requesting order for Vitamin D lab draw. Patient had labs today wanted to add on Vitamin D

## 2019-05-25 NOTE — Telephone Encounter (Signed)
Ok for lab addon if possible

## 2019-05-25 NOTE — Telephone Encounter (Signed)
Patient has appointment today at 1:20.  Please advise

## 2019-05-25 NOTE — Progress Notes (Signed)
Subjective:    Patient ID: Russell Penner., male    DOB: 29-Jun-1970, 49 y.o.   MRN: [id number]  HPI  Here for wellness and f/u;  Overall doing ok;  Pt denies Chest pain, worsening SOB, DOE, wheezing, orthopnea, PND, worsening LE edema, palpitations, dizziness or syncope.  Pt denies neurological change such as new headache, facial or extremity weakness.  Pt denies polydipsia, polyuria, or low sugar symptoms. Pt states overall good compliance with treatment and medications, good tolerability, and has been trying to follow appropriate diet.  Pt denies worsening depressive symptoms, suicidal ideation or panic. No fever, night sweats, wt loss, loss of appetite, or other constitutional symptoms.  Pt states good ability with ADL's, has low fall risk, home safety reviewed and adequate, no other significant changes in hearing or vision, and only occasionally active with exercise. BP Readings from Last 3 Encounters:  05/25/19 130/72  03/18/19 (!) 162/80  11/21/18 136/82   Wt Readings from Last 3 Encounters:  05/25/19 199 lb 4 oz (90.4 kg)  04/13/19 199 lb (90.3 kg)  03/18/19 199 lb (90.3 kg)   Past Medical History:  Diagnosis Date  . ASTHMA, CHILDHOOD 04/18/2009   Qualifier: Diagnosis of  By: Jonny Ruiz MD, Len Blalock   . BIPOLAR DISORDER UNSPECIFIED 04/18/2009   Qualifier: Diagnosis of  By: Jonny Ruiz MD, Len Blalock   . GERD (gastroesophageal reflux disease) 07/30/2013  . Hypertension   . Insomnia   . Multiple sclerosis (HCC) 2015  . Psoriasis 12/15/2013  . Ventral hernia without obstruction or gangrene 12/15/2013  . Vitamin D deficiency 03/18/2019   Past Surgical History:  Procedure Laterality Date  . HERNIA REPAIR      reports that he has never smoked. He has never used smokeless tobacco. He reports that he does not drink alcohol or use drugs. family history includes COPD in his sister; Diabetes in his father; Fibromyalgia in his sister; Healthy in his sister; Heart disease in his father; Hypertension in his  father and mother; Parkinson's disease in his father; Stroke in his father. Allergies  Allergen Reactions  . Zyrtec [Cetirizine] Other (See Comments)    Fatigued, felt bad, weak  . Penicillins Itching and Rash    Little bumps   Current Outpatient Medications on File Prior to Visit  Medication Sig Dispense Refill  . amLODipine (NORVASC) 10 MG tablet Take 1 tablet (10 mg total) by mouth daily. 90 tablet 3  . aspirin EC 81 MG tablet Take 1 tablet (81 mg total) by mouth daily. 90 tablet 11  . Glatiramer Acetate (GLATOPA) 40 MG/ML SOSY INJECT 1 SYRINGE SUBCUTANEOUSLY THREE TIMES A WEEK 12 mL 0  . Omeprazole 20 MG TBDD Take by mouth.    . telmisartan (MICARDIS) 80 MG tablet Take 1 tablet by mouth once daily 90 tablet 0  . Diclofenac Sodium (PENNSAID) 2 % SOLN Place 1 application onto the skin 2 (two) times daily. [phone] (Mobile) (Patient not taking: Reported on 03/18/2019) 1 Bottle 3   No current facility-administered medications on file prior to visit.   Review of Systems All otherwise neg per pt     Objective:   Physical Exam BP 130/72 (BP Location: Left Arm, Patient Position: Sitting, Cuff Size: Large)   Pulse (!) 53   Temp 98 F (36.7 C) (Oral)   Ht 5\' 7"  (1.702 m)   Wt 199 lb 4 oz (90.4 kg)   SpO2 97%   BMI 31.21 kg/m  VS noted,  Constitutional: Pt appears in  NAD HENT: Head: NCAT.  Right Ear: External ear normal.  Left Ear: External ear normal.  Eyes: . Pupils are equal, round, and reactive to light. Conjunctivae and EOM are normal Nose: without d/c or deformity Neck: Neck supple. Gross normal ROM Cardiovascular: Normal rate and regular rhythm.   Pulmonary/Chest: Effort normal and breath sounds without rales or wheezing.  Abd:  Soft, NT, ND, + BS, no organomegaly Neurological: Pt is alert. At baseline orientation, motor grossly intact Skin: Skin is warm. No rashes, other new lesions, no LE edema Psychiatric: Pt behavior is normal without agitation  All otherwise  neg per pt  Lab Results  Component Value Date   WBC 5.1 05/25/2019   HGB 13.7 05/25/2019   HCT 40.4 05/25/2019   PLT 179.0 05/25/2019   GLUCOSE 113 (H) 05/25/2019   CHOL 99 05/25/2019   TRIG 75.0 05/25/2019   HDL 27.50 (L) 05/25/2019   LDLCALC 57 05/25/2019   ALT 48 05/25/2019   AST 30 05/25/2019   NA 139 05/25/2019   K 3.7 05/25/2019   CL 107 05/25/2019   CREATININE 0.69 05/25/2019   BUN 6 05/25/2019   CO2 24 05/25/2019   TSH 1.22 05/25/2019   PSA 0.47 05/20/2018   HGBA1C 5.8 05/25/2019       Assessment & Plan:

## 2019-05-25 NOTE — Patient Instructions (Signed)
Please continue all other medications as before, and refills have been done if requested.  Please have the pharmacy call with any other refills you may need.  Please continue your efforts at being more active, low cholesterol diet, and weight control.  You are otherwise up to date with prevention measures today.  Please keep your appointments with your specialists as you may have planned  Your lab work was done today  You will be contacted by phone if any changes need to be made immediately.  Otherwise, you will receive a letter about your results with an explanation, but please check with MyChart first.  Please remember to sign up for MyChart if you have not done so, as this will be important to you in the future with finding out test results, communicating by private email, and scheduling acute appointments online when needed.  Please make an Appointment to return in 6 months, or sooner if needed

## 2019-05-26 NOTE — Telephone Encounter (Signed)
This lab was completed on 05-25-2019

## 2019-06-29 ENCOUNTER — Encounter: Payer: Self-pay | Admitting: Internal Medicine

## 2019-07-01 ENCOUNTER — Encounter: Payer: Self-pay | Admitting: Internal Medicine

## 2019-09-21 ENCOUNTER — Encounter: Payer: Self-pay | Admitting: Internal Medicine

## 2019-09-22 ENCOUNTER — Telehealth: Payer: Self-pay

## 2019-09-22 NOTE — Telephone Encounter (Signed)
Called pt there was no answer LMOM w/MD response, also replied back via mychart msg...Raechel Chute

## 2019-09-22 NOTE — Telephone Encounter (Signed)
New message    The patient is checking to see if Dr. Jonny Ruiz look at his my chart messages from 8.16.21   Asking for a call back

## 2019-09-22 NOTE — Telephone Encounter (Signed)
Yes, should be ok to take as he described

## 2019-10-13 ENCOUNTER — Telehealth: Payer: Self-pay | Admitting: Neurology

## 2019-10-13 NOTE — Telephone Encounter (Signed)
Best to start every other day from today, so skip tomorrow.

## 2019-10-13 NOTE — Telephone Encounter (Signed)
Patient called and said, "I forgot to give myself the glatiramer acetate injection for MS last night. I usually take it every other day. I took it today. Will it be okay to take it again tomorrow or will I need to skip another day? It's okay to leave a message if I don't pick up."

## 2019-10-13 NOTE — Telephone Encounter (Signed)
Please advise 

## 2019-10-13 NOTE — Telephone Encounter (Signed)
Left message on voicemail of instructions per Dr.Patel.

## 2019-11-23 ENCOUNTER — Other Ambulatory Visit: Payer: Self-pay

## 2019-11-23 ENCOUNTER — Encounter: Payer: Self-pay | Admitting: Internal Medicine

## 2019-11-23 ENCOUNTER — Ambulatory Visit: Payer: BC Managed Care – PPO | Admitting: Internal Medicine

## 2019-11-23 ENCOUNTER — Ambulatory Visit: Payer: BC Managed Care – PPO | Admitting: Neurology

## 2019-11-23 VITALS — BP 156/80 | HR 67 | Temp 98.4°F | Ht 67.0 in | Wt 197.4 lb

## 2019-11-23 DIAGNOSIS — E785 Hyperlipidemia, unspecified: Secondary | ICD-10-CM | POA: Diagnosis not present

## 2019-11-23 DIAGNOSIS — I10 Essential (primary) hypertension: Secondary | ICD-10-CM

## 2019-11-23 DIAGNOSIS — R739 Hyperglycemia, unspecified: Secondary | ICD-10-CM

## 2019-11-23 DIAGNOSIS — E559 Vitamin D deficiency, unspecified: Secondary | ICD-10-CM

## 2019-11-23 DIAGNOSIS — E538 Deficiency of other specified B group vitamins: Secondary | ICD-10-CM

## 2019-11-23 DIAGNOSIS — Z0000 Encounter for general adult medical examination without abnormal findings: Secondary | ICD-10-CM

## 2019-11-23 NOTE — Progress Notes (Signed)
Subjective:    Patient ID: Russell Moses., male    DOB: [birth year], 49 y.o.   MRN: [id number]  HPI  Here to f/u; overall doing ok,  Pt denies chest pain, increasing sob or doe, wheezing, orthopnea, PND, increased LE swelling, palpitations, dizziness or syncope.  Pt denies new neurological symptoms such as new headache, or facial or extremity weakness or numbness.  Pt denies polydipsia, polyuria, or low sugar episode.  Pt states overall good compliance with meds, mostly trying to follow appropriate diet, with wt overall stable,  but little exercise however except for occasional walking. SBP at home usually 136-140  Good compliane with amlodiepine/micardis   Wt Readings from Last 3 Encounters:  11/23/19 197 lb 6.4 oz (89.5 kg)  05/25/19 199 lb 4 oz (90.4 kg)  04/13/19 199 lb (90.3 kg)   Past Medical History:  Diagnosis Date  . ASTHMA, CHILDHOOD 04/18/2009   Qualifier: Diagnosis of  By: Jonny Ruiz MD, Len Blalock   . BIPOLAR DISORDER UNSPECIFIED 04/18/2009   Qualifier: Diagnosis of  By: Jonny Ruiz MD, Len Blalock   . GERD (gastroesophageal reflux disease) 07/30/2013  . Hypertension   . Insomnia   . Multiple sclerosis (HCC) 2015  . Psoriasis 12/15/2013  . Ventral hernia without obstruction or gangrene 12/15/2013  . Vitamin D deficiency 03/18/2019   Past Surgical History:  Procedure Laterality Date  . HERNIA REPAIR      reports that he has never smoked. He has never used smokeless tobacco. He reports that he does not drink alcohol and does not use drugs. family history includes COPD in his sister; Diabetes in his father; Fibromyalgia in his sister; Healthy in his sister; Heart disease in his father; Hypertension in his father and mother; Parkinson's disease in his father; Stroke in his father. Allergies  Allergen Reactions  . Zyrtec [Cetirizine] Other (See Comments)    Fatigued, felt bad, weak  . Penicillins Itching and Rash    Little bumps   Current Outpatient Medications on File Prior to Visit    Medication Sig Dispense Refill  . amLODipine (NORVASC) 10 MG tablet Take 1 tablet (10 mg total) by mouth daily. 90 tablet 3  . aspirin EC 81 MG tablet Take 1 tablet (81 mg total) by mouth daily. 90 tablet 11  . Glatiramer Acetate (GLATOPA) 40 MG/ML SOSY INJECT 1 SYRINGE SUBCUTANEOUSLY THREE TIMES A WEEK 12 mL 0  . Omeprazole 20 MG TBDD Take by mouth.    . telmisartan (MICARDIS) 80 MG tablet Take 1 tablet by mouth once daily 90 tablet 0  . Diclofenac Sodium (PENNSAID) 2 % SOLN Place 1 application onto the skin 2 (two) times daily. [phone] (Mobile) (Patient not taking: Reported on 11/23/2019) 1 Bottle 3   No current facility-administered medications on file prior to visit.   Review of Systems All otherwise neg per pt    Objective:   Physical Exam BP (!) 156/80 (BP Location: Right Arm, Patient Position: Sitting, Cuff Size: Normal)   Pulse 67   Temp 98.4 F (36.9 C) (Oral)   Ht 5\' 7"  (1.702 m)   Wt 197 lb 6.4 oz (89.5 kg)   SpO2 96%   BMI 30.92 kg/m  VS noted,  Constitutional: Pt appears in NAD HENT: Head: NCAT.  Right Ear: External ear normal.  Left Ear: External ear normal.  Eyes: . Pupils are equal, round, and reactive to light. Conjunctivae and EOM are normal Nose: without d/c or deformity Neck: Neck supple. Gross normal ROM Cardiovascular: Normal  rate and regular rhythm.   Pulmonary/Chest: Effort normal and breath sounds without rales or wheezing.  Abd:  Soft, NT, ND, + BS, no organomegaly Neurological: Pt is alert. At baseline orientation, motor grossly intact Skin: Skin is warm. No rashes, other new lesions, no LE edema Psychiatric: Pt behavior is normal without agitation  All otherwise neg per pt  Lab Results  Component Value Date   WBC 5.1 05/25/2019   HGB 13.7 05/25/2019   HCT 40.4 05/25/2019   PLT 179.0 05/25/2019   GLUCOSE 113 (H) 05/25/2019   CHOL 99 05/25/2019   TRIG 75.0 05/25/2019   HDL 27.50 (L) 05/25/2019   LDLCALC 57 05/25/2019   ALT 48  05/25/2019   AST 30 05/25/2019   NA 139 05/25/2019   K 3.7 05/25/2019   CL 107 05/25/2019   CREATININE 0.69 05/25/2019   BUN 6 05/25/2019   CO2 24 05/25/2019   TSH 1.22 05/25/2019   PSA 0.47 05/20/2018   HGBA1C 5.8 05/25/2019      Assessment & Plan:

## 2019-11-23 NOTE — Assessment & Plan Note (Signed)
stable overall by history and exam, recent data reviewed with pt, and pt to continue medical treatment as before,  to f/u any worsening symptoms or concerns  

## 2019-11-23 NOTE — Assessment & Plan Note (Signed)
Cont oral replacement 

## 2019-11-23 NOTE — Assessment & Plan Note (Signed)
stable overall by history and exam, recent data reviewed with pt, and pt to continue medical treatment as before,  to f/u any worsening symptoms or concerns for increased exercise for improved HDL

## 2019-11-23 NOTE — Patient Instructions (Signed)
Please continue all other medications as before, and refills have been done if requested.  Please have the pharmacy call with any other refills you may need.  Please continue your efforts at being more active, low cholesterol diet, and weight control.  Please keep your appointments with your specialists as you may have planned  Please make an Appointment to return in 6 months, or sooner if needed, also with Lab Appointment for testing done 3-5 days before at the FIRST FLOOR Lab (so this is for TWO appointments - please see the scheduling desk as you leave)  

## 2019-11-23 NOTE — Assessment & Plan Note (Signed)
Increase to twice per wk

## 2019-11-23 NOTE — Assessment & Plan Note (Addendum)
stable overall by history and exam, recent data reviewed with pt, and pt to continue medical treatment as before,  to f/u any worsening symptoms or concerns  I spent 31 minutes in preparing to see the patient by review of recent labs, imaging and procedures, obtaining and reviewing separately obtained history, communicating with the patient and family or caregiver, ordering medications, tests or procedures, and documenting clinical information in the EHR including the differential Dx, treatment, and any further evaluation and other management of htn, b12 def, hyperglycemia, hld, vit d def

## 2019-12-21 ENCOUNTER — Ambulatory Visit (INDEPENDENT_AMBULATORY_CARE_PROVIDER_SITE_OTHER): Payer: BC Managed Care – PPO | Admitting: Neurology

## 2019-12-21 ENCOUNTER — Other Ambulatory Visit: Payer: Self-pay

## 2019-12-21 ENCOUNTER — Encounter: Payer: Self-pay | Admitting: Neurology

## 2019-12-21 VITALS — BP 155/80 | HR 115 | Ht 67.0 in | Wt 199.0 lb

## 2019-12-21 DIAGNOSIS — G35 Multiple sclerosis: Secondary | ICD-10-CM | POA: Diagnosis not present

## 2019-12-21 MED ORDER — DIAZEPAM 2 MG PO TABS: ORAL_TABLET | ORAL | 0 refills | Status: DC

## 2019-12-21 NOTE — Patient Instructions (Signed)
You look great.  Start an gentle exercise program, keep well-hydrated, and eat nutritious meals  MRI brain wwo contrast  I will see you back in 1 year

## 2019-12-21 NOTE — Progress Notes (Signed)
Follow-up Visit   Date: 12/21/19    Russell Moses. MRN: [id number] DOB: May 26, 1970   Interim History: Russell Moses. is a 49 y.o. right-handed Caucasian male with history of hypertension, bipolar disorder, asthma, herpetic zoster, and anxiety returning to the clinic for follow-up of relapsing remitting multiple sclerosis.  The patient was accompanied to the clinic by self.  History of present illness: In 2014, he developed a tingling sensation over his head and get "swimming headed", which lasted a few seconds and spontaneously resolved. Starting in February 2015, he developed tremors of the hands and bilateral leg weakness. MRI brain in April 2015 showed white matter changes concerning for demyelinating disease.  CSF testing was also supportive of MS.  He was treated with 3-days of IVMP in May 2015 which significantly improved symptoms.  He started copaxone on 10/2013 and has been tolerating it well, besides injection site lipodystrophy.  Annual surveillance imaging has shown stable disease burden and he has not had any relapses since initial diagnosis in Spring 2015.  UPDATE 12/21/2019:  He is here for follow-up visit.  No new complaints, besides generalized fatigue with exertion.  Specifically, he denies any new numbness, tingling, weakness, or vision changes.  He is tolerating glatirmer acetate well.  He is up-to-date with his eye exam and reports mild astigmatism in the right eye.   Medications:  Current Outpatient Medications on File Prior to Visit  Medication Sig Dispense Refill  . amLODipine (NORVASC) 10 MG tablet Take 1 tablet (10 mg total) by mouth daily. 90 tablet 3  . aspirin EC 81 MG tablet Take 1 tablet (81 mg total) by mouth daily. 90 tablet 11  . Diclofenac Sodium (PENNSAID) 2 % SOLN Place 1 application onto the skin 2 (two) times daily. [phone] (Mobile) 1 Bottle 3  . Glatiramer Acetate (GLATOPA) 40 MG/ML SOSY INJECT 1 SYRINGE SUBCUTANEOUSLY THREE TIMES A WEEK 12 mL  0  . Omeprazole 20 MG TBDD Take by mouth.    . telmisartan (MICARDIS) 80 MG tablet Take 1 tablet by mouth once daily 90 tablet 0   No current facility-administered medications on file prior to visit.    Allergies:  Allergies  Allergen Reactions  . Zyrtec [Cetirizine] Other (See Comments)    Fatigued, felt bad, weak  . Penicillins Itching and Rash    Little bumps     Vital Signs:  BP (!) 155/80   Pulse (!) 115   Ht 5\' 7"  (1.702 m)   Wt 199 lb (90.3 kg)   SpO2 95%   BMI 31.17 kg/m   Neurological Exam: MENTAL STATUS including orientation to time, place, person, recent and remote memory, attention span and concentration, language, and fund of knowledge is normal.  Speech is not dysarthric.  CRANIAL NERVES: No visual field defects.  Pupils equal round and reactive to light.  Normal conjugate, extra-ocular eye movements in all directions of gaze.  No ptosis. Normal facial sensation.  Face is symmetric. Palate elevates symmetrically.  Tongue is midline.  MOTOR:  Motor strength is 5/5 in all extremities.  No atrophy, fasciculations or abnormal movements.  No pronator drift.  Tone is normal.    MSRs:  Reflexes are 2+/4 throughout.  SENSORY:  Intact to vibration throughout.  COORDINATION/GAIT:  Normal finger-to- nose-finger and heel-to-shin.  Intact rapid alternating movements bilaterally.  Gait narrow based and stable.    Data: MRI brain wwo contrast 11/03/2018:  No new lesions compared with December 02, 2017.  No lesions with  acute features.   MRI brain wwo contrast 11/28/2016:   Essentially stable findings of multiple sclerosis. No acute intracranial abnormality.  MRI brain wwo contrast 11/17/2015:  No significant changes since 12/16/2014.  No abnormal enhancement.  MRI brain wwo contrast 12/16/2014: 1. Unchanged supratentorial white matter lesions. No definite new lesions. No abnormal enhancement or restricted diffusion. 2. Unchanged focus of encephalomalacia within the  cortex of the left frontal lobe, may represent an area of old infarct. 3. Paransal sinus inflammatory change, correlate clinically.  MRI brain wwo contrast 12/25/2013: Multiple lesoins in the periventricular and juxtacortical white matter regions bilaterally. There is no longer enhancement or diffusion abnormality. No new lesions MRI cervical spine wwo contrast: Normal  MRI brain wwo contrast 05/28/2013:  Multiple lesions in the periventricular and juxtacortical white matter bilaterally, one of which enhances.  The pattern and distribution of these lesions suggest demyelinating disease such as multiple sclerosis.  MRI cervical spine wwo contrast 06/04/2013:  Normal MRI cervical spine   EMG right upper and lower extremity 05/14/2013:  There is electrophysiological evidence of an intraspinal canal lesion (i.e. radiculopathy) affecting the L5 nerve root/segment bilaterally. Overall, these changes are mild in degree electrically and worse on the right side. There is no evidence of a generalized sensorimotor polyneuropathy, cervical radiculopathy or diffuse myopathy.   CSF 06/22/2013: R0 W0 G61 P27  ACE 4, Lyme neg, MBP 61, IgG 1.6 OCB The patient's CSF contains >5 well defined gamma restriction bands that are also present in the patient's  corresponding serum sample, but some bands in the CSF are more prominent.  CSF cytology with few mononuclear cells  Lab Results  Component Value Date   TSH 1.22 05/25/2019   Lab Results  Component Value Date   VITAMINB12 249 05/25/2019    IMPRESSION/PLAN: Relapsing-remitting multiple sclerosis, in remission.  Diagnosed in May 2015 with symptoms manifesting with generalized fatigue and tremors.  MRI brain shows periventricular and juxtacortical white matter lesions, which has been stable on subsequent imaging.  He has been doing great on glatiramer acetate 40mg  SQ three times daily since 2015, which will be continued. He is due for annual surveillance imaging  with MRI brain wwo contrast.  Premedicate with valium 5mg .   For his fatigue, recommend staying active and starting gentle exercise program, nutritious meals, and taking vitamin B12 daily  Return to clinic 1 year   Thank you for allowing me to participate in patient's care.  If I can answer any additional questions, I would be pleased to do so.    Sincerely,    Tayvia Faughnan K. 2016, DO

## 2020-01-05 ENCOUNTER — Other Ambulatory Visit: Payer: Self-pay

## 2020-01-05 ENCOUNTER — Ambulatory Visit
Admission: RE | Admit: 2020-01-05 | Discharge: 2020-01-05 | Disposition: A | Discharge status: Home / Self Care | Payer: Self-pay | Source: Ambulatory Visit | Attending: Neurology | Admitting: Neurology

## 2020-01-05 DIAGNOSIS — G35 Multiple sclerosis: Secondary | ICD-10-CM

## 2020-01-06 ENCOUNTER — Telehealth: Payer: Self-pay | Admitting: Neurology

## 2020-01-06 NOTE — Telephone Encounter (Signed)
Please inform patient that his MRI brain shows stable multiple sclerosis, no active disease of evidence of progression. Thanks.

## 2020-01-06 NOTE — Telephone Encounter (Signed)
Called patient and informed him of MRI results. Patient verbalized understanding and had no questions or concerns.  

## 2020-02-25 ENCOUNTER — Encounter: Payer: Self-pay | Admitting: Internal Medicine

## 2020-02-26 ENCOUNTER — Other Ambulatory Visit: Payer: Self-pay

## 2020-02-26 MED ORDER — AMLODIPINE BESYLATE 10 MG PO TABS
10.0000 mg | ORAL_TABLET | Freq: Every day | ORAL | 3 refills | Status: DC
Start: 2020-02-26 — End: 2021-02-21

## 2020-02-26 MED ORDER — TELMISARTAN 80 MG PO TABS
80.0000 mg | ORAL_TABLET | Freq: Every day | ORAL | 0 refills | Status: DC
Start: 2020-02-26 — End: 2020-10-03

## 2020-03-30 ENCOUNTER — Other Ambulatory Visit: Payer: Self-pay | Admitting: Neurology

## 2020-05-06 ENCOUNTER — Other Ambulatory Visit: Payer: Self-pay

## 2020-05-06 NOTE — Telephone Encounter (Signed)
[facility], patient called to see if this has been completed.

## 2020-05-06 NOTE — Telephone Encounter (Signed)
The pharmacy called in stating there must be a glitch in the system that is why we have not gotten the request. They asked for [place] to send a request to them so they can fill it.

## 2020-05-23 ENCOUNTER — Ambulatory Visit: Payer: BC Managed Care – PPO | Admitting: Internal Medicine

## 2020-05-30 ENCOUNTER — Ambulatory Visit: Payer: BC Managed Care – PPO | Admitting: Internal Medicine

## 2020-05-30 ENCOUNTER — Encounter: Payer: Self-pay | Admitting: Internal Medicine

## 2020-05-30 ENCOUNTER — Other Ambulatory Visit (INDEPENDENT_AMBULATORY_CARE_PROVIDER_SITE_OTHER): Payer: BC Managed Care – PPO

## 2020-05-30 ENCOUNTER — Other Ambulatory Visit: Payer: Self-pay

## 2020-05-30 VITALS — BP 122/70 | HR 58 | Temp 98.8°F | Ht 67.0 in | Wt 194.6 lb

## 2020-05-30 DIAGNOSIS — Z125 Encounter for screening for malignant neoplasm of prostate: Secondary | ICD-10-CM

## 2020-05-30 DIAGNOSIS — E538 Deficiency of other specified B group vitamins: Secondary | ICD-10-CM | POA: Diagnosis not present

## 2020-05-30 DIAGNOSIS — E559 Vitamin D deficiency, unspecified: Secondary | ICD-10-CM

## 2020-05-30 DIAGNOSIS — Z0000 Encounter for general adult medical examination without abnormal findings: Secondary | ICD-10-CM

## 2020-05-30 DIAGNOSIS — E785 Hyperlipidemia, unspecified: Secondary | ICD-10-CM

## 2020-05-30 DIAGNOSIS — E7800 Pure hypercholesterolemia, unspecified: Secondary | ICD-10-CM | POA: Diagnosis not present

## 2020-05-30 DIAGNOSIS — I10 Essential (primary) hypertension: Secondary | ICD-10-CM

## 2020-05-30 DIAGNOSIS — R739 Hyperglycemia, unspecified: Secondary | ICD-10-CM | POA: Diagnosis not present

## 2020-05-30 DIAGNOSIS — Z0001 Encounter for general adult medical examination with abnormal findings: Secondary | ICD-10-CM

## 2020-05-30 LAB — HEPATIC FUNCTION PANEL
ALT: 38 U/L (ref 0–53)
AST: 22 U/L (ref 0–37)
Albumin: 3.9 g/dL (ref 3.5–5.2)
Alkaline Phosphatase: 103 U/L (ref 39–117)
Bilirubin, Direct: 0.1 mg/dL (ref 0.0–0.3)
Total Bilirubin: 0.3 mg/dL (ref 0.2–1.2)
Total Protein: 6.7 g/dL (ref 6.0–8.3)

## 2020-05-30 LAB — LIPID PANEL
Cholesterol: 134 mg/dL (ref 0–200)
HDL: 37.6 mg/dL — ABNORMAL LOW (ref >39.00)
LDL Cholesterol: 84 mg/dL (ref 0–99)
NonHDL: 96.86
Total CHOL/HDL Ratio: 4
Triglycerides: 66 mg/dL (ref 0.0–149.0)
VLDL: 13.2 mg/dL (ref 0.0–40.0)

## 2020-05-30 LAB — VITAMIN B12: Vitamin B-12: 288 pg/mL (ref 211–911)

## 2020-05-30 LAB — URINALYSIS, ROUTINE W REFLEX MICROSCOPIC
Bilirubin Urine: NEGATIVE
Hgb urine dipstick: NEGATIVE
Ketones, ur: NEGATIVE
Leukocytes,Ua: NEGATIVE
Nitrite: NEGATIVE
RBC / HPF: NONE SEEN (ref 0–?)
Specific Gravity, Urine: 1.015 (ref 1.000–1.030)
Total Protein, Urine: NEGATIVE
Urine Glucose: NEGATIVE
Urobilinogen, UA: 0.2 (ref 0.0–1.0)
WBC, UA: NONE SEEN (ref 0–?)
pH: 7.5 (ref 5.0–8.0)

## 2020-05-30 LAB — BASIC METABOLIC PANEL
BUN: 9 mg/dL (ref 6–23)
CO2: 21 mEq/L (ref 19–32)
Calcium: 9.3 mg/dL (ref 8.4–10.5)
Chloride: 108 mEq/L (ref 96–112)
Creatinine, Ser: 0.82 mg/dL (ref 0.40–1.50)
GFR: 102.05 mL/min (ref >60.00)
Glucose, Bld: 113 mg/dL — ABNORMAL HIGH (ref 70–99)
Potassium: 4.2 mEq/L (ref 3.5–5.1)
Sodium: 139 mEq/L (ref 135–145)

## 2020-05-30 LAB — CBC WITH DIFFERENTIAL/PLATELET
Basophils Absolute: 0 10*3/uL (ref 0.0–0.1)
Basophils Relative: 0.8 % (ref 0.0–3.0)
Eosinophils Absolute: 0.2 10*3/uL (ref 0.0–0.7)
Eosinophils Relative: 4 % (ref 0.0–5.0)
HCT: 42.8 % (ref 39.0–52.0)
Hemoglobin: 14.5 g/dL (ref 13.0–17.0)
Lymphocytes Relative: 35.2 % (ref 12.0–46.0)
Lymphs Abs: 1.6 10*3/uL (ref 0.7–4.0)
MCHC: 33.9 g/dL (ref 30.0–36.0)
MCV: 86.6 fl (ref 78.0–100.0)
Monocytes Absolute: 0.4 10*3/uL (ref 0.1–1.0)
Monocytes Relative: 7.8 % (ref 3.0–12.0)
Neutro Abs: 2.4 10*3/uL (ref 1.4–7.7)
Neutrophils Relative %: 52.2 % (ref 43.0–77.0)
Platelets: 169 10*3/uL (ref 150.0–400.0)
RBC: 4.94 Mil/uL (ref 4.22–5.81)
RDW: 14 % (ref 11.5–15.5)
WBC: 4.6 10*3/uL (ref 4.0–10.5)

## 2020-05-30 LAB — HEMOGLOBIN A1C: Hgb A1c MFr Bld: 5.7 % (ref 4.6–6.5)

## 2020-05-30 LAB — VITAMIN D 25 HYDROXY (VIT D DEFICIENCY, FRACTURES): VITD: 22.59 ng/mL — ABNORMAL LOW (ref 30.00–100.00)

## 2020-05-30 LAB — PSA: PSA: 0.54 ng/mL (ref 0.10–4.00)

## 2020-05-30 LAB — TSH: TSH: 1.29 u[IU]/mL (ref 0.35–4.50)

## 2020-05-30 NOTE — Progress Notes (Signed)
Patient ID: Russell Moses., male   DOB: [birth year], 50 y.o.   MRN: [id number]         Chief Complaint:: wellness exam and Follow-up (6 month f/u)  hyperglycemia, htn, hld, vit B12 and D deficiency       HPI:  Russell Moses. is a 50 y.o. male here for wellness exam; declines Tdap or covid or colonoscopy, o/w up to date with preventive referrals and immunizations                        Also Pt denies chest pain, increased sob or doe, wheezing, orthopnea, PND, increased LE swelling, palpitations, dizziness or syncope.   Pt denies polydipsia, polyuria, or new focal neuro s/s.   Pt denies fever, wt loss, night sweats, loss of appetite, or other constitutional symptoms  Denies worsening depressive symptoms, suicidal ideation, or panic; has ongoing anxiety.  No other new complaints   Wt Readings from Last 3 Encounters:  05/30/20 194 lb 9.6 oz (88.3 kg)  12/21/19 199 lb (90.3 kg)  11/23/19 197 lb 6.4 oz (89.5 kg)   BP Readings from Last 3 Encounters:  05/30/20 122/70  12/21/19 (!) 155/80  11/23/19 (!) 156/80   There is no immunization history on file for this patient. There are no preventive care reminders to display for this patient.    Past Medical History:  Diagnosis Date  . ASTHMA, CHILDHOOD 04/18/2009   Qualifier: Diagnosis of  By: Jonny Ruiz MD, Len Blalock   . BIPOLAR DISORDER UNSPECIFIED 04/18/2009   Qualifier: Diagnosis of  By: Jonny Ruiz MD, Len Blalock   . GERD (gastroesophageal reflux disease) 07/30/2013  . Hypertension   . Insomnia   . Multiple sclerosis (HCC) 2015  . Psoriasis 12/15/2013  . Ventral hernia without obstruction or gangrene 12/15/2013  . Vitamin D deficiency 03/18/2019   Past Surgical History:  Procedure Laterality Date  . HERNIA REPAIR      reports that he has never smoked. He has never used smokeless tobacco. He reports that he does not drink alcohol and does not use drugs. family history includes COPD in his sister; Diabetes in his father; Fibromyalgia in his sister; Healthy  in his sister; Heart disease in his father; Hypertension in his father and mother; Parkinson's disease in his father; Stroke in his father. Allergies  Allergen Reactions  . Zyrtec [Cetirizine] Other (See Comments)    Fatigued, felt bad, weak  . Penicillins Itching and Rash    Little bumps   Current Outpatient Medications on File Prior to Visit  Medication Sig Dispense Refill  . amLODipine (NORVASC) 10 MG tablet Take 1 tablet (10 mg total) by mouth daily. 90 tablet 3  . aspirin EC 81 MG tablet Take 1 tablet (81 mg total) by mouth daily. 90 tablet 11  . diazepam (VALIUM) 2 MG tablet Take 1 tablet 30-min prior to MRI.  OK to repeat at facility, as needed.  Do not drive. 2 tablet 0  . Glatiramer Acetate 40 MG/ML SOSY INJECT 1 SYRINGES SUBCUTANEOUSLY THREE TIMES A WEEK 12 mL 0  . Omeprazole 20 MG TBDD Take by mouth.    . telmisartan (MICARDIS) 80 MG tablet Take 1 tablet (80 mg total) by mouth daily. 90 tablet 0  . Diclofenac Sodium (PENNSAID) 2 % SOLN Place 1 application onto the skin 2 (two) times daily. [phone] (Mobile) (Patient not taking: Reported on 05/30/2020) 1 Bottle 3   No current facility-administered medications on file prior to  visit.        ROS:  All others reviewed and negative.  Objective        PE:  BP 122/70 (BP Location: Left Arm, Patient Position: Sitting, Cuff Size: Large)   Pulse (!) 58   Temp 98.8 F (37.1 C) (Oral)   Ht 5\' 7"  (1.702 m)   Wt 194 lb 9.6 oz (88.3 kg)   SpO2 97%   BMI 30.48 kg/m                 Constitutional: Pt appears in NAD               HENT: Head: NCAT.                Right Ear: External ear normal.                 Left Ear: External ear normal.                Eyes: . Pupils are equal, round, and reactive to light. Conjunctivae and EOM are normal               Nose: without d/c or deformity               Neck: Neck supple. Gross normal ROM               Cardiovascular: Normal rate and regular rhythm.                 Pulmonary/Chest:  Effort normal and breath sounds without rales or wheezing.                Abd:  Soft, NT, ND, + BS, no organomegaly               Neurological: Pt is alert. At baseline orientation, motor grossly intact               Skin: Skin is warm. No rashes, no other new lesions, LE edema - none               Psychiatric: Pt behavior is normal without agitation   Micro: none  Cardiac tracings I have personally interpreted today:  none  Pertinent Radiological findings (summarize): none   Lab Results  Component Value Date   WBC 4.6 05/30/2020   HGB 14.5 05/30/2020   HCT 42.8 05/30/2020   PLT 169.0 05/30/2020   GLUCOSE 113 (H) 05/30/2020   CHOL 134 05/30/2020   TRIG 66.0 05/30/2020   HDL 37.60 (L) 05/30/2020   LDLCALC 84 05/30/2020   ALT 38 05/30/2020   AST 22 05/30/2020   NA 139 05/30/2020   K 4.2 05/30/2020   CL 108 05/30/2020   CREATININE 0.82 05/30/2020   BUN 9 05/30/2020   CO2 21 05/30/2020   TSH 1.29 05/30/2020   PSA 0.54 05/30/2020   HGBA1C 5.7 05/30/2020   Assessment/Plan:  Russell Moses. is a 50 y.o. White or Caucasian [1] male with  has a past medical history of ASTHMA, CHILDHOOD (04/18/2009), BIPOLAR DISORDER UNSPECIFIED (04/18/2009), GERD (gastroesophageal reflux disease) (07/30/2013), Hypertension, Insomnia, Multiple sclerosis (HCC) (2015), Psoriasis (12/15/2013), Ventral hernia without obstruction or gangrene (12/15/2013), and Vitamin D deficiency (03/18/2019).  Vitamin D deficiency Last vitamin D Lab Results  Component Value Date   VD25OH 22.59 (L) 05/30/2020   Low, to start oral replacement   Encounter for well adult exam with abnormal findings Age and sex appropriate education and counseling updated with regular  exercise and diet Referrals for preventative services - declines colonoscopy Immunizations addressed - declines tdap, covid booster Smoking counseling  - none needed Evidence for depression or other mood disorder - none significant today Most recent labs  reviewed. I have personally reviewed and have noted: 1) the patient's medical and social history 2) The patient's current medications and supplements 3) The patient's height, weight, and BMI have been recorded in the chart   Hypertension BP Readings from Last 3 Encounters:  05/30/20 122/70  12/21/19 (!) 155/80  11/23/19 (!) 156/80   Stable, pt to continue medical treatment norvasc, micardis   Hyperlipidemia Lab Results  Component Value Date   LDLCALC 84 05/30/2020   Stable, pt to continue current statin  - diet   Hyperglycemia Lab Results  Component Value Date   HGBA1C 5.7 05/30/2020   Stable, pt to continue current medical treatment   - diet   B12 deficiency Lab Results  Component Value Date   VITAMINB12 288 05/30/2020   Stable, cont oral replacement - b12 1000 mcg qd   Followup: Return in about 1 year (around 05/30/2021).  Oliver Barre, MD 06/07/2020 8:57 PM [facility]

## 2020-05-30 NOTE — Patient Instructions (Signed)
Please continue all other medications as before, and refills have been done if requested.  Please have the pharmacy call with any other refills you may need.  Please continue your efforts at being more active, low cholesterol diet, and weight control.  You are otherwise up to date with prevention measures today.  Please keep your appointments with your specialists as you may have planned  Your lab work was done this morning  You will be contacted by phone if any changes need to be made immediately.  Otherwise, you will receive a letter about your results with an explanation, but please check with MyChart first.  Please remember to sign up for MyChart if you have not done so, as this will be important to you in the future with finding out test results, communicating by private email, and scheduling acute appointments online when needed.  Please make an Appointment to return for your 1 year visit, or sooner if needed, with Lab testing by Appointment as well, to be done about 3-5 days before at the FIRST FLOOR Lab (so this is for TWO appointments - please see the scheduling desk as you leave)  Due to the ongoing Covid 19 pandemic, our lab now requires an appointment for any labs done at our office.  If you need labs done and do not have an appointment, please call our office ahead of time to schedule before presenting to the lab for your testing.

## 2020-06-07 ENCOUNTER — Encounter: Payer: Self-pay | Admitting: Internal Medicine

## 2020-06-07 NOTE — Assessment & Plan Note (Signed)
Lab Results  Component Value Date   HGBA1C 5.7 05/30/2020   Stable, pt to continue current medical treatment  - diet  

## 2020-06-07 NOTE — Assessment & Plan Note (Signed)
Lab Results  Component Value Date   LDLCALC 84 05/30/2020   Stable, pt to continue current statin  - diet

## 2020-06-07 NOTE — Assessment & Plan Note (Signed)
Last vitamin D Lab Results  Component Value Date   VD25OH 22.59 (L) 05/30/2020   Low, to start oral replacement

## 2020-06-07 NOTE — Assessment & Plan Note (Signed)
Lab Results  Component Value Date   VITAMINB12 288 05/30/2020   Stable, cont oral replacement - b12 1000 mcg qd

## 2020-06-07 NOTE — Assessment & Plan Note (Signed)
BP Readings from Last 3 Encounters:  05/30/20 122/70  12/21/19 (!) 155/80  11/23/19 (!) 156/80   Stable, pt to continue medical treatment norvasc, micardis

## 2020-06-07 NOTE — Assessment & Plan Note (Signed)
Age and sex appropriate education and counseling updated with regular exercise and diet Referrals for preventative services - declines colonoscopy Immunizations addressed - declines tdap, covid booster Smoking counseling  - none needed Evidence for depression or other mood disorder - none significant today Most recent labs reviewed. I have personally reviewed and have noted: 1) the patient's medical and social history 2) The patient's current medications and supplements 3) The patient's height, weight, and BMI have been recorded in the chart

## 2020-09-05 HISTORY — PX: CHOLECYSTECTOMY: SHX55

## 2020-10-03 ENCOUNTER — Encounter: Payer: Self-pay | Admitting: Internal Medicine

## 2020-10-03 ENCOUNTER — Other Ambulatory Visit: Payer: Self-pay | Admitting: Internal Medicine

## 2020-10-03 MED ORDER — TELMISARTAN 80 MG PO TABS
80.0000 mg | ORAL_TABLET | Freq: Every day | ORAL | 2 refills | Status: DC
Start: 2020-10-03 — End: 2022-04-03

## 2020-12-19 ENCOUNTER — Encounter: Payer: Self-pay | Admitting: Neurology

## 2020-12-19 ENCOUNTER — Ambulatory Visit: Payer: BC Managed Care – PPO | Admitting: Neurology

## 2020-12-19 ENCOUNTER — Other Ambulatory Visit: Payer: Self-pay

## 2020-12-19 VITALS — BP 137/68 | HR 55 | Ht 67.0 in | Wt 197.6 lb

## 2020-12-19 DIAGNOSIS — G35 Multiple sclerosis: Secondary | ICD-10-CM

## 2020-12-19 MED ORDER — DIAZEPAM 5 MG PO TABS: ORAL_TABLET | ORAL | 0 refills | Status: DC

## 2020-12-19 NOTE — Patient Instructions (Signed)
We will order MRI brain wwo contrast at [facility] vitamin B12 daily  Return to clinic in 1 year

## 2020-12-19 NOTE — Progress Notes (Signed)
Follow-up Visit   Date: 12/19/20    Russell Moses. MRN: [id number] DOB: 08-Jul-1970   Interim History: Russell Moses. is a 50 y.o. right-handed Caucasian male with history of hypertension, bipolar disorder, asthma, herpetic zoster, and anxiety returning to the clinic for follow-up of relapsing remitting multiple sclerosis.  The patient was accompanied to the clinic by self.  History of present illness: In 2014, he developed a tingling sensation over his head and get "swimming headed", which lasted a few seconds and spontaneously resolved. Starting in February 2015, he developed tremors of the hands and bilateral leg weakness. MRI brain in April 2015 showed white matter changes concerning for demyelinating disease.  CSF testing was also supportive of MS.  He was treated with 3-days of IVMP in May 2015 which significantly improved symptoms.  He started copaxone on 10/2013 and has been tolerating it well, besides injection site lipodystrophy.  Annual surveillance imaging has shown stable disease burden and he has not had any relapses since initial diagnosis in Spring 2015.  UPDATE 12/19/2020:  He is here for follow-up visit. He has some difficulty with falling asleep, especially if it is still bright outside. He also complains of on going fatigue. His last B12 level was 288, he is not taking supplements.  He denies vision changes, numbness/tingling, or limb weakness.  He continues to tolerate glatiramer acetate, but has loss of fat in the thigh, so adjusting where he administers the medication.  Medications:  Current Outpatient Medications on File Prior to Visit  Medication Sig Dispense Refill   amLODipine (NORVASC) 10 MG tablet Take 1 tablet (10 mg total) by mouth daily. 90 tablet 3   aspirin EC 81 MG tablet Take 1 tablet (81 mg total) by mouth daily. 90 tablet 11   Glatiramer Acetate 40 MG/ML SOSY INJECT 1 SYRINGES SUBCUTANEOUSLY THREE TIMES A WEEK 12 mL 0   Omeprazole 20 MG TBDD Take by  mouth.     telmisartan (MICARDIS) 80 MG tablet Take 1 tablet (80 mg total) by mouth daily. (Patient taking differently: Take 80 mg by mouth daily. Pt takes 40 mg) 90 tablet 2   No current facility-administered medications on file prior to visit.    Allergies:  Allergies  Allergen Reactions   Zyrtec [Cetirizine] Other (See Comments)    Fatigued, felt bad, weak   Penicillins Itching and Rash    Little bumps     Vital Signs:  BP 137/68   Pulse (!) 55   Ht 5\' 7"  (1.702 m)   Wt 197 lb 9.6 oz (89.6 kg)   SpO2 97%   BMI 30.95 kg/m   Neurological Exam: MENTAL STATUS including orientation to time, place, person, recent and remote memory, attention span and concentration, language, and fund of knowledge is normal.  Speech is not dysarthric.  CRANIAL NERVES: No visual field defects.  Pupils equal round and reactive to light.  Normal conjugate, extra-ocular eye movements in all directions of gaze.  No ptosis. Normal facial sensation.  Face is symmetric. Palate elevates symmetrically.  Tongue is midline.  MOTOR:  Motor strength is 5/5 in all extremities.  No atrophy, fasciculations or abnormal movements.  No pronator drift.  Tone is normal.    MSRs:  Reflexes are 2+/4 throughout.  SENSORY:  Intact to vibration throughout.  COORDINATION/GAIT:  Normal finger-to- nose-finger and heel-to-shin.  Intact rapid alternating movements bilaterally.  Gait narrow based and stable.    Data: MRI brain wwo contrast 01/04/2020:  No change  MRI brain wwo contrast 11/03/2018:  No new lesions compared with December 02, 2017.  No lesions with acute features.   MRI brain wwo contrast 11/28/2016:   Essentially stable findings of multiple sclerosis. No acute intracranial abnormality.  MRI brain wwo contrast 11/17/2015:  No significant changes since 12/16/2014.  No abnormal enhancement.  MRI brain wwo contrast 12/16/2014: 1.  Unchanged supratentorial white matter lesions.  No definite new lesions.  No  abnormal enhancement or restricted diffusion. 2.  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 3.  Paransal sinus inflammatory change, correlate clinically.  MRI brain wwo contrast 12/25/2013:  Multiple lesoins in the periventricular and juxtacortical white matter regions bilaterally.  There is no longer enhancement or diffusion abnormality.  No new lesions MRI cervical spine wwo contrast:  Normal  MRI brain wwo contrast 05/28/2013:  Multiple lesions in the periventricular and juxtacortical white matter bilaterally, one of which enhances.  The pattern and distribution of these lesions suggest demyelinating disease such as multiple sclerosis.  MRI cervical spine wwo contrast 06/04/2013:  Normal MRI cervical spine   EMG right upper and lower extremity 05/14/2013:  There is electrophysiological evidence of an intraspinal canal lesion (i.e. radiculopathy) affecting the L5 nerve root/segment bilaterally. Overall, these changes are mild in degree electrically and worse on the right side. There is no evidence of a generalized sensorimotor polyneuropathy, cervical radiculopathy or diffuse myopathy.   CSF 06/22/2013: R0 W0 G61 P27  ACE 4, Lyme neg, MBP 61, IgG 1.6 OCB The patient's CSF contains >5 well defined gamma restriction bands that are also present in the patient's  corresponding serum sample, but some bands in the CSF are more prominent.  CSF cytology with few mononuclear cells  Lab Results  Component Value Date   TSH 1.29 05/30/2020   Lab Results  Component Value Date   VITAMINB12 288 05/30/2020    IMPRESSION/PLAN: Relapsing-remitting multiple sclerosis, in remission. Diagnosed in May 2015 with symptoms manifesting with generalized fatigue and tremors.  MRI brain shows periventricular and juxtacortical white matter lesions, which has been stable on imaging. He has been doing well on glatiramer acetate 40mg  SQ three times  daily since 2015, which will be continued.  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   MRI brain wwo contrast for annual surveillance. Premedicate with valium 5mg .   For fatigue, start vitamin B12 2016 daily as his levels are borderline-low.   Return to clinic in 1 year   Thank you for allowing me to participate in patient's care.  If I can answer any additional questions, I would be pleased to do so.    Sincerely,    Dicky Boer K. , DO

## 2020-12-20 ENCOUNTER — Other Ambulatory Visit: Payer: Self-pay | Admitting: Neurology

## 2020-12-20 ENCOUNTER — Telehealth: Payer: Self-pay

## 2020-12-20 DIAGNOSIS — G35 Multiple sclerosis: Secondary | ICD-10-CM

## 2020-12-20 NOTE — Telephone Encounter (Signed)
Per insurance not covered at [facility], must go to [employer]. Left message for patient to call if sedative is needed. Will forward approval to [employer]. Approval number [id number].Valid 12/20/2020-02/17/2021.

## 2021-01-16 ENCOUNTER — Ambulatory Visit
Admission: RE | Admit: 2021-01-16 | Discharge: 2021-01-16 | Disposition: A | Discharge status: Home / Self Care | Payer: BC Managed Care – PPO | Source: Ambulatory Visit | Attending: Neurology | Admitting: Neurology

## 2021-01-16 ENCOUNTER — Other Ambulatory Visit: Payer: Self-pay

## 2021-01-16 DIAGNOSIS — G35 Multiple sclerosis: Secondary | ICD-10-CM

## 2021-01-16 DIAGNOSIS — I639 Cerebral infarction, unspecified: Secondary | ICD-10-CM | POA: Diagnosis not present

## 2021-01-16 MED ORDER — GADOBENATE DIMEGLUMINE 529 MG/ML IV SOLN
17.0000 mL | Freq: Once | INTRAVENOUS | Status: AC | PRN
  Administered 2021-01-16: 17 mL via INTRAVENOUS

## 2021-01-18 ENCOUNTER — Telehealth: Payer: Self-pay | Admitting: Neurology

## 2021-01-18 NOTE — Telephone Encounter (Signed)
Patient is returning a call to [facility]. Russell Moses you can leave a message on his machine to what the call is about

## 2021-01-19 NOTE — Telephone Encounter (Signed)
See result note. Patient has been informed of results.

## 2021-01-19 NOTE — Telephone Encounter (Signed)
Patient called again this morning, returning a call to [facility].

## 2021-01-31 DIAGNOSIS — J019 Acute sinusitis, unspecified: Secondary | ICD-10-CM | POA: Diagnosis not present

## 2021-01-31 DIAGNOSIS — R0981 Nasal congestion: Secondary | ICD-10-CM | POA: Diagnosis not present

## 2021-01-31 DIAGNOSIS — R509 Fever, unspecified: Secondary | ICD-10-CM | POA: Diagnosis not present

## 2021-02-01 ENCOUNTER — Encounter: Payer: Self-pay | Admitting: Internal Medicine

## 2021-02-21 ENCOUNTER — Encounter: Payer: Self-pay | Admitting: Internal Medicine

## 2021-02-21 MED ORDER — AMLODIPINE BESYLATE 10 MG PO TABS: 10.0000 mg | ORAL_TABLET | Freq: Every day | ORAL | 1 refills | Status: DC

## 2021-04-23 ENCOUNTER — Encounter: Payer: Self-pay | Admitting: Neurology

## 2021-05-03 ENCOUNTER — Other Ambulatory Visit: Payer: Self-pay | Admitting: Neurology

## 2021-05-03 ENCOUNTER — Encounter: Payer: Self-pay | Admitting: Neurology

## 2021-05-03 ENCOUNTER — Ambulatory Visit: Payer: BC Managed Care – PPO | Admitting: Neurology

## 2021-05-03 ENCOUNTER — Other Ambulatory Visit: Payer: Self-pay

## 2021-05-03 VITALS — BP 132/77 | HR 60 | Ht 67.0 in | Wt 195.0 lb

## 2021-05-03 DIAGNOSIS — G35 Multiple sclerosis: Secondary | ICD-10-CM

## 2021-05-03 MED ORDER — DIAZEPAM 5 MG PO TABS: ORAL_TABLET | ORAL | 0 refills | Status: DC

## 2021-05-03 NOTE — Progress Notes (Signed)
? ? ?Follow-up Visit ? ? ?Date: 05/03/21 ?  ? ?[name]. ?MRN: [id number] ?DOB: 14-Apr-1970 ? ? ?Interim History: ?Russell Moses. is a 51 y.o. right-handed Caucasian male with history of hypertension, bipolar disorder, asthma, herpetic zoster, and anxiety returning to the clinic for follow-up of relapsing remitting multiple sclerosis.  The patient was accompanied to the clinic by self. ? ?History of present illness: ?In 2014, he developed a tingling sensation over his head and get "swimming headed", which lasted a few seconds and spontaneously resolved. Starting in February 2015, he developed tremors of the hands and bilateral leg weakness. MRI brain in April 2015 showed white matter changes concerning for demyelinating disease.  CSF testing was also supportive of MS.  He was treated with 3-days of IVMP in May 2015 which significantly improved symptoms.  He started copaxone on 10/2013 and has been tolerating it well, besides injection site lipodystrophy.  Annual surveillance imaging has shown stable disease burden and he has not had any relapses since initial diagnosis in Spring 2015. ? ?UPDATE 12/19/2020:  He is here for follow-up visit. He has some difficulty with falling asleep, especially if it is still bright outside. He also complains of on going fatigue. His last B12 level was 288, he is not taking supplements.  He denies vision changes, numbness/tingling, or limb weakness.  He continues to tolerate glatiramer acetate, but has loss of fat in the thigh, so adjusting where he administers the medication. ? ?UPDATE 05/03/2021:   Over the past 2 weeks, he has started to notice new weakness of the arms and legs.  He has difficulty raising his arm with weight, such as lifting a jug, or even keeping his arms raised to drive his car.  He has sensation of weakness in the thighs.  Rest improves his weakness, but there is always a constant feeling of weakness.  No new vision changes or numbness/tingling. No interval  illness, falls, or new medications.   ? ?He takes vitamin C, omega three fatty acid, vitamin B12, vitamin D, and vitamin B complex. ? ?Medications:  ?Current Outpatient Medications on File Prior to Visit  ?Medication Sig Dispense Refill  ? amLODipine (NORVASC) 10 MG tablet Take 1 tablet (10 mg total) by mouth daily. 90 tablet 1  ? aspirin EC 81 MG tablet Take 1 tablet (81 mg total) by mouth daily. 90 tablet 11  ? diazepam (VALIUM) 5 MG tablet Take 1 tablet 30-min prior MRI. 2 tablet 0  ? Glatiramer Acetate 40 MG/ML SOSY INJECT 1 SYRINGES SUBCUTANEOUSLY THREE TIMES A WEEK 12 mL 0  ? Omeprazole 20 MG TBDD Take by mouth.    ? telmisartan (MICARDIS) 80 MG tablet Take 1 tablet (80 mg total) by mouth daily. (Patient taking differently: Take 80 mg by mouth daily. Pt takes 40 mg) 90 tablet 2  ? ?No current facility-administered medications on file prior to visit.  ? ? ?Allergies:  ?Allergies  ?Allergen Reactions  ? Zyrtec [Cetirizine] Other (See Comments)  ?  Fatigued, felt bad, weak  ? Penicillins Itching and Rash  ?  Little bumps  ? ? ? ?Vital Signs:  ?BP 132/77   Pulse 60   Ht 5\' 7"  (1.702 m)   Wt 195 lb (88.5 kg)   SpO2 97%   BMI 30.54 kg/m?  ? ?Neurological Exam: ?MENTAL STATUS including orientation to time, place, person, recent and remote memory, attention span and concentration, language, and fund of knowledge is normal.  Speech is not dysarthric. ? ?  CRANIAL NERVES: ?No visual field defects.  Pupils equal round and reactive to light.  Normal conjugate, extra-ocular eye movements in all directions of gaze.  No ptosis. Normal facial sensation.  Face is symmetric. Palate elevates symmetrically.  Tongue is midline. ? ?MOTOR:  Motor strength is 5/5 in all extremities.  No atrophy, fasciculations or abnormal movements.  No pronator drift.  Tone is normal.   ? ?MSRs:  Reflexes are 2+/4 throughout. ? ?SENSORY:  Intact to vibration throughout. ? ?COORDINATION/GAIT:  Normal finger-to- nose-finger and heel-to-shin.   Intact rapid alternating movements bilaterally.  Gait narrow based and stable.  ? ? ?Data: ?MRI brain wwo contrast 01/04/2020:  No change ? ?MRI brain wwo contrast 11/03/2018:  No new lesions compared with December 02, 2017.  No lesions with acute features.  ? ?MRI brain wwo contrast 11/28/2016:   Essentially stable findings of multiple sclerosis. No acute intracranial abnormality. ? ?MRI brain wwo contrast 11/17/2015:  No significant changes since 12/16/2014.  No abnormal enhancement. ? ?MRI brain wwo contrast 12/16/2014: ?1.  Unchanged supratentorial white matter lesions.  No definite new lesions.  No abnormal enhancement or restricted diffusion. ?2.  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 ?3.  Paransal sinus inflammatory change, correlate clinically. ? ?MRI brain wwo contrast 12/25/2013:  Multiple lesoins in the periventricular and juxtacortical white matter regions bilaterally.  There is no longer enhancement or diffusion abnormality.  No new lesions ?MRI cervical spine wwo contrast:  Normal ? ?MRI brain wwo contrast 05/28/2013:  Multiple lesions in the periventricular and juxtacortical white matter bilaterally, one of which enhances.  The pattern and distribution of these lesions suggest demyelinating disease such as multiple sclerosis.  ?MRI cervical spine wwo contrast 06/04/2013:  Normal MRI cervical spine  ? ?EMG right upper and lower extremity 05/14/2013:  There is electrophysiological evidence of an intraspinal canal lesion (i.e. radiculopathy) affecting the L5 nerve root/segment bilaterally. Overall, these changes are mild in degree electrically and worse on the right side. There is no evidence of a generalized sensorimotor polyneuropathy, cervical radiculopathy or diffuse myopathy.  ? ?CSF 06/22/2013: R0 W0 G61 P27  ACE 4, Lyme neg, MBP 61, IgG 1.6 ?OCB The patient's CSF contains >5 well defined gamma restriction bands that are also  present in the patient's  corresponding serum sample, but some bands in the CSF are more prominent.  ?CSF cytology with few mononuclear cells ? ?Lab Results  ?Component Value Date  ? TSH 1.29 05/30/2020  ? ?Lab Results  ?Component Value Date  ? [id number] 288 05/30/2020  ? ? ?IMPRESSION/PLAN: ?Relapsing-remitting multiple sclerosis, diagnosed 2015 presenting with generalized fatigue and tremors.  MRI brain shows periventricular and juxtacortical white matter lesions, which has been stable on imaging.  He has been on glatiramer acetate 40mg  SQ three times weekly since 2015, without relapse.   ? ?Today, he presents with new complaints of bilateral arm and leg weakness.  Exam does not disclose focal deficits, however, give his atypical presentation in the past and brisk reflexes, I will check MRI brain and cervical spine wwo contrast.  ?If imaging shows active disease, start solumedrol.  Otherwise, recommend out-patient PT for arm and leg strengthening. ? ?Continue glatiramer acetate 40mg  SQ three times weekly and vitamin D supplements ? ?Return to clinic in 6 months ? ?Thank you for allowing me to participate in patient's care.  If I can answer any additional questions, I would be pleased to  do so.   ? ?Sincerely, ? ? ? ?Robb Sibal K. Allena Katz, DO ? ? ? ?

## 2021-05-03 NOTE — Patient Instructions (Signed)
MRI brain and cervical spine wwo contrast ? ?We will call you with the results and let you know the next step ?

## 2021-05-29 ENCOUNTER — Encounter: Payer: Self-pay | Admitting: Neurology

## 2021-05-29 ENCOUNTER — Ambulatory Visit
Admission: RE | Admit: 2021-05-29 | Discharge: 2021-05-29 | Disposition: A | Discharge status: Home / Self Care | Payer: BC Managed Care – PPO | Source: Ambulatory Visit | Attending: Neurology | Admitting: Neurology

## 2021-05-29 DIAGNOSIS — M5022 Other cervical disc displacement, mid-cervical region, unspecified level: Secondary | ICD-10-CM | POA: Diagnosis not present

## 2021-05-29 DIAGNOSIS — G35 Multiple sclerosis: Secondary | ICD-10-CM

## 2021-05-29 DIAGNOSIS — R9082 White matter disease, unspecified: Secondary | ICD-10-CM | POA: Diagnosis not present

## 2021-05-29 DIAGNOSIS — I639 Cerebral infarction, unspecified: Secondary | ICD-10-CM | POA: Diagnosis not present

## 2021-05-29 DIAGNOSIS — G379 Demyelinating disease of central nervous system, unspecified: Secondary | ICD-10-CM | POA: Diagnosis not present

## 2021-05-29 MED ORDER — GADOBENATE DIMEGLUMINE 529 MG/ML IV SOLN
17.0000 mL | Freq: Once | INTRAVENOUS | Status: AC | PRN
  Administered 2021-05-29: 17 mL via INTRAVENOUS

## 2021-06-05 ENCOUNTER — Other Ambulatory Visit (INDEPENDENT_AMBULATORY_CARE_PROVIDER_SITE_OTHER): Payer: BC Managed Care – PPO

## 2021-06-05 ENCOUNTER — Encounter: Payer: Self-pay | Admitting: Internal Medicine

## 2021-06-05 ENCOUNTER — Ambulatory Visit (INDEPENDENT_AMBULATORY_CARE_PROVIDER_SITE_OTHER): Payer: BC Managed Care – PPO | Admitting: Internal Medicine

## 2021-06-05 VITALS — BP 132/70 | HR 50 | Temp 98.1°F | Ht 67.0 in | Wt 194.0 lb

## 2021-06-05 DIAGNOSIS — R739 Hyperglycemia, unspecified: Secondary | ICD-10-CM | POA: Diagnosis not present

## 2021-06-05 DIAGNOSIS — Z0001 Encounter for general adult medical examination with abnormal findings: Secondary | ICD-10-CM

## 2021-06-05 DIAGNOSIS — I10 Essential (primary) hypertension: Secondary | ICD-10-CM | POA: Diagnosis not present

## 2021-06-05 DIAGNOSIS — E559 Vitamin D deficiency, unspecified: Secondary | ICD-10-CM

## 2021-06-05 DIAGNOSIS — E7800 Pure hypercholesterolemia, unspecified: Secondary | ICD-10-CM | POA: Diagnosis not present

## 2021-06-05 DIAGNOSIS — E538 Deficiency of other specified B group vitamins: Secondary | ICD-10-CM | POA: Diagnosis not present

## 2021-06-05 DIAGNOSIS — G35 Multiple sclerosis: Secondary | ICD-10-CM

## 2021-06-05 LAB — CBC WITH DIFFERENTIAL/PLATELET
Basophils Absolute: 0 10*3/uL (ref 0.0–0.1)
Basophils Relative: 0.8 % (ref 0.0–3.0)
Eosinophils Absolute: 0.1 10*3/uL (ref 0.0–0.7)
Eosinophils Relative: 2.6 % (ref 0.0–5.0)
HCT: 42.2 % (ref 39.0–52.0)
Hemoglobin: 14.2 g/dL (ref 13.0–17.0)
Lymphocytes Relative: 29.2 % (ref 12.0–46.0)
Lymphs Abs: 1.6 10*3/uL (ref 0.7–4.0)
MCHC: 33.7 g/dL (ref 30.0–36.0)
MCV: 88.7 fl (ref 78.0–100.0)
Monocytes Absolute: 0.5 10*3/uL (ref 0.1–1.0)
Monocytes Relative: 8.9 % (ref 3.0–12.0)
Neutro Abs: 3.1 10*3/uL (ref 1.4–7.7)
Neutrophils Relative %: 58.5 % (ref 43.0–77.0)
Platelets: 163 10*3/uL (ref 150.0–400.0)
RBC: 4.76 Mil/uL (ref 4.22–5.81)
RDW: 13.2 % (ref 11.5–15.5)
WBC: 5.3 10*3/uL (ref 4.0–10.5)

## 2021-06-05 LAB — HEMOGLOBIN A1C: Hgb A1c MFr Bld: 5.6 % (ref 4.6–6.5)

## 2021-06-05 LAB — BASIC METABOLIC PANEL
BUN: 13 mg/dL (ref 6–23)
CO2: 24 mEq/L (ref 19–32)
Calcium: 8.9 mg/dL (ref 8.4–10.5)
Chloride: 107 mEq/L (ref 96–112)
Creatinine, Ser: 0.91 mg/dL (ref 0.40–1.50)
GFR: 97.21 mL/min (ref >60.00)
Glucose, Bld: 99 mg/dL (ref 70–99)
Potassium: 4.1 mEq/L (ref 3.5–5.1)
Sodium: 138 mEq/L (ref 135–145)

## 2021-06-05 LAB — LIPID PANEL
Cholesterol: 138 mg/dL (ref 0–200)
HDL: 41.3 mg/dL (ref >39.00)
LDL Cholesterol: 84 mg/dL (ref 0–99)
NonHDL: 96.33
Total CHOL/HDL Ratio: 3
Triglycerides: 63 mg/dL (ref 0.0–149.0)
VLDL: 12.6 mg/dL (ref 0.0–40.0)

## 2021-06-05 LAB — TSH: TSH: 0.98 u[IU]/mL (ref 0.35–5.50)

## 2021-06-05 LAB — HEPATIC FUNCTION PANEL
ALT: 43 U/L (ref 0–53)
AST: 21 U/L (ref 0–37)
Albumin: 4.1 g/dL (ref 3.5–5.2)
Alkaline Phosphatase: 97 U/L (ref 39–117)
Bilirubin, Direct: 0.1 mg/dL (ref 0.0–0.3)
Total Bilirubin: 0.4 mg/dL (ref 0.2–1.2)
Total Protein: 6.5 g/dL (ref 6.0–8.3)

## 2021-06-05 LAB — URINALYSIS, ROUTINE W REFLEX MICROSCOPIC
Bilirubin Urine: NEGATIVE
Hgb urine dipstick: NEGATIVE
Ketones, ur: NEGATIVE
Leukocytes,Ua: NEGATIVE
Nitrite: NEGATIVE
RBC / HPF: NONE SEEN (ref 0–?)
Specific Gravity, Urine: 1.02 (ref 1.000–1.030)
Total Protein, Urine: NEGATIVE
Urine Glucose: NEGATIVE
Urobilinogen, UA: 0.2 (ref 0.0–1.0)
pH: 6 (ref 5.0–8.0)

## 2021-06-05 LAB — VITAMIN D 25 HYDROXY (VIT D DEFICIENCY, FRACTURES): VITD: 42.93 ng/mL (ref 30.00–100.00)

## 2021-06-05 LAB — PSA: PSA: 0.36 ng/mL (ref 0.10–4.00)

## 2021-06-05 NOTE — Assessment & Plan Note (Signed)
Lab Results  ?Component Value Date  ? [id number] 288 05/30/2020  ? ?Low, to start oral replacement - b12 1000 mcg qd ? ?

## 2021-06-05 NOTE — Assessment & Plan Note (Signed)
Last vitamin D ?Lab Results  ?Component Value Date  ? VD25OH 42.93 06/05/2021  ? ?Stable, cont oral replacement ? ?

## 2021-06-05 NOTE — Progress Notes (Signed)
Patient ID: Russell Molder., male   DOB: [birth year], 51 y.o.   MRN: [id number] ? ? ? ?     Chief Complaint:: wellness exam and low b12, low vit d, htn, hyperglycemia, hld ? ?     HPI:  Russell Other Atienza. is a 51 y.o. male here for wellness exam; declines colonoscopy, hep c and hiv screen, o/w up to date ?         ?              Also not taking B12.  Pt denies chest pain, increased sob or doe, wheezing, orthopnea, PND, increased LE swelling, palpitations, dizziness or syncope.   Pt denies polydipsia, polyuria, or new focal neuro s/s.    Pt denies fever, wt loss, night sweats, loss of appetite, or other constitutional symptoms  Sees neurology regularly.   ?  ?Wt Readings from Last 3 Encounters:  ?06/05/21 194 lb (88 kg)  ?05/03/21 195 lb (88.5 kg)  ?12/19/20 197 lb 9.6 oz (89.6 kg)  ? ?BP Readings from Last 3 Encounters:  ?06/05/21 132/70  ?05/03/21 132/77  ?12/19/20 137/68  ? ?There is no immunization history on file for this patient. ?Health Maintenance Due  ?Topic Date Due  ? HIV Screening  Never done  ? Hepatitis C Screening  Never done  ? ?  ? ?Past Medical History:  ?Diagnosis Date  ? ASTHMA, CHILDHOOD 04/18/2009  ? Qualifier: Diagnosis of  By: Jonny Ruiz MD, Len Blalock   ? BIPOLAR DISORDER UNSPECIFIED 04/18/2009  ? Qualifier: Diagnosis of  By: Jonny Ruiz MD, Len Blalock   ? GERD (gastroesophageal reflux disease) 07/30/2013  ? Hypertension   ? Insomnia   ? Multiple sclerosis (HCC) 2015  ? Psoriasis 12/15/2013  ? Ventral hernia without obstruction or gangrene 12/15/2013  ? Vitamin D deficiency 03/18/2019  ? ?Past Surgical History:  ?Procedure Laterality Date  ? CHOLECYSTECTOMY  09/2020  ? HERNIA REPAIR    ? ? reports that he has never smoked. He has never used smokeless tobacco. He reports that he does not drink alcohol and does not use drugs. ?family history includes COPD in his sister; Diabetes in his father; Fibromyalgia in his sister; Healthy in his sister; Heart disease in his father; Hypertension in his father and mother; Parkinson's  disease in his father; Stroke in his father. ?Allergies  ?Allergen Reactions  ? Zyrtec [Cetirizine] Other (See Comments)  ?  Fatigued, felt bad, weak  ? Penicillins Itching and Rash  ?  Little bumps  ? ?Current Outpatient Medications on File Prior to Visit  ?Medication Sig Dispense Refill  ? amLODipine (NORVASC) 10 MG tablet Take 1 tablet (10 mg total) by mouth daily. 90 tablet 1  ? aspirin EC 81 MG tablet Take 1 tablet (81 mg total) by mouth daily. 90 tablet 11  ? Glatiramer Acetate 40 MG/ML SOSY INJECT 1 SYRINGE SUBCUTANEOUSLY THREE TIMES A WEEK 12 mL 11  ? Omeprazole 20 MG TBDD Take by mouth.    ? telmisartan (MICARDIS) 80 MG tablet Take 1 tablet (80 mg total) by mouth daily. (Patient taking differently: Take 80 mg by mouth daily. Pt takes 40 mg) 90 tablet 2  ? diazepam (VALIUM) 5 MG tablet Take 1 tablet 30-min prior MRI. (Patient not taking: Reported on 06/05/2021) 2 tablet 0  ? ?No current facility-administered medications on file prior to visit.  ? ?     ROS:  All others reviewed and negative. ? ?Objective  ? ?  PE:  BP 132/70 (BP Location: Left Arm, Patient Position: Sitting, Cuff Size: Large)   Pulse (!) 50   Temp 98.1 ?F (36.7 ?C) (Oral)   Ht 5\' 7"  (1.702 m)   Wt 194 lb (88 kg)   SpO2 97%   BMI 30.38 kg/m?  ? ?              Constitutional: Pt appears in NAD ?              HENT: Head: NCAT.  ?              Right Ear: External ear normal.   ?              Left Ear: External ear normal.  ?              Eyes: . Pupils are equal, round, and reactive to light. Conjunctivae and EOM are normal ?              Nose: without d/c or deformity ?              Neck: Neck supple. Gross normal ROM ?              Cardiovascular: Normal rate and regular rhythm.   ?              Pulmonary/Chest: Effort normal and breath sounds without rales or wheezing.  ?              Abd:  Soft, NT, ND, + BS, no organomegaly ?              Neurological: Pt is alert. At baseline orientation, motor grossly intact ?              Skin:  Skin is warm. No rashes, no other new lesions, LE edema - none ?              Psychiatric: Pt behavior is normal without agitation  ? ?Micro: none ? ?Cardiac tracings I have personally interpreted today:  none ? ?Pertinent Radiological findings (summarize): none  ? ?Lab Results  ?Component Value Date  ? WBC 5.3 06/05/2021  ? HGB 14.2 06/05/2021  ? HCT 42.2 06/05/2021  ? PLT 163.0 06/05/2021  ? GLUCOSE 99 06/05/2021  ? CHOL 138 06/05/2021  ? TRIG 63.0 06/05/2021  ? HDL 41.30 06/05/2021  ? LDLCALC 84 06/05/2021  ? ALT 43 06/05/2021  ? AST 21 06/05/2021  ? NA 138 06/05/2021  ? K 4.1 06/05/2021  ? CL 107 06/05/2021  ? CREATININE 0.91 06/05/2021  ? BUN 13 06/05/2021  ? CO2 24 06/05/2021  ? TSH 0.98 06/05/2021  ? PSA 0.36 06/05/2021  ? HGBA1C 5.6 06/05/2021  ? ?Assessment/Plan:  ?Russell Theurer. is a 51 y.o. White or Caucasian [1] male with  has a past medical history of ASTHMA, CHILDHOOD (04/18/2009), BIPOLAR DISORDER UNSPECIFIED (04/18/2009), GERD (gastroesophageal reflux disease) (07/30/2013), Hypertension, Insomnia, Multiple sclerosis (HCC) (2015), Psoriasis (12/15/2013), Ventral hernia without obstruction or gangrene (12/15/2013), and Vitamin D deficiency (03/18/2019). ? ?No problem-specific Assessment & Plan notes found for this encounter. ? ?Followup: No follow-ups on file. ? ?05/16/2019, MD 06/05/2021 1:50 PM ?[facility] ?[facility] ?Internal Medicine ?

## 2021-06-05 NOTE — Patient Instructions (Signed)

## 2021-06-07 NOTE — Assessment & Plan Note (Signed)
BP Readings from Last 3 Encounters:  ?06/05/21 132/70  ?05/03/21 132/77  ?12/19/20 137/68  ? ?Stable, pt to continue medical treatment micardis, norvasc ? ?

## 2021-06-07 NOTE — Assessment & Plan Note (Addendum)
Age and sex appropriate education and counseling updated with regular exercise and diet ?Referrals for preventative services - declines colonoscopy ?Immunizations addressed - declines hep c and hiv screen ?Smoking counseling  - none needed ?Evidence for depression or other mood disorder - anxiety depression stable overall ?Most recent labs reviewed. ?I have personally reviewed and have noted: ?1) the patient's medical and social history ?2) The patient's current medications and supplements ?3) The patient's height, weight, and BMI have been recorded in the chart ? ?

## 2021-06-07 NOTE — Assessment & Plan Note (Signed)
Lab Results  ?Component Value Date  ? [place] 84 06/05/2021  ? ?Stable, pt to continue current low chol diet ? ?

## 2021-06-07 NOTE — Assessment & Plan Note (Signed)
Lab Results  ?Component Value Date  ? HGBA1C 5.6 06/05/2021  ? ?Stable, pt to continue current medical treatment  - diet ? ?

## 2021-06-07 NOTE — Assessment & Plan Note (Signed)
To f/u neurology as planned ?

## 2021-06-22 DIAGNOSIS — M21611 Bunion of right foot: Secondary | ICD-10-CM | POA: Diagnosis not present

## 2021-06-22 DIAGNOSIS — M1711 Unilateral primary osteoarthritis, right knee: Secondary | ICD-10-CM | POA: Diagnosis not present

## 2021-07-17 ENCOUNTER — Encounter: Payer: Self-pay | Admitting: Podiatry

## 2021-07-17 ENCOUNTER — Ambulatory Visit: Payer: BC Managed Care – PPO | Admitting: Podiatry

## 2021-07-17 DIAGNOSIS — M79671 Pain in right foot: Secondary | ICD-10-CM

## 2021-07-17 DIAGNOSIS — Q828 Other specified congenital malformations of skin: Secondary | ICD-10-CM | POA: Diagnosis not present

## 2021-07-17 NOTE — Progress Notes (Signed)
  Subjective:  Patient ID: Russell Bene., male    DOB: November 18, 1970,   MRN: [id number]  Chief Complaint  Patient presents with   Callouses    I have some spots on the ball of the right foot and I am using a pad and that helps and has been going on for about a month and I have been using the corn pads and the skin is peeling    51 y.o. male presents for concern of spots on the bottom of his feet that area painful and been there about a month. Has tried padding with some relief but works on concrete floors at [employer] and can be quite painful . Denies any other pedal complaints. Denies n/v/f/c.   Past Medical History:  Diagnosis Date   ASTHMA, CHILDHOOD 04/18/2009   Qualifier: Diagnosis of  By: Jenny Reichmann MD, [place] UNSPECIFIED 04/18/2009   Qualifier: Diagnosis of  By: Jenny Reichmann MD, Hunt Oris    GERD (gastroesophageal reflux disease) 07/30/2013   Hypertension    Insomnia    Multiple sclerosis ([place]) 2015   Psoriasis 12/15/2013   Ventral hernia without obstruction or gangrene 12/15/2013   Vitamin D deficiency 03/18/2019    Objective:  Physical Exam: Vascular: DP/PT pulses 2/4 bilateral. CFT <3 seconds. Normal hair growth on digits. No edema.  Skin. No lacerations or abrasions bilateral feet. Hyperkeratotic cores lesion noted sub second and fifth metatarsals on the left.  Musculoskeletal: MMT 5/5 bilateral lower extremities in DF, PF, Inversion and Eversion. Deceased ROM in DF of ankle joint.  Neurological: Sensation intact to light touch.   Assessment:   1. Porokeratosis   2. Right foot pain      Plan:  Patient was evaluated and treated and all questions answered. -Discussed corns and calluses with patient and treatment options.  -Hyperkeratotic tissue was debrided with chisel without incident.  -Applied salycylic acid treatment to area with dressing. Advised to remove bandaging tomorrow.  -Encouraged daily moisturizing -Discussed use of pumice stone -Advised good  supportive shoes and inserts -Patient to return to office as needed or sooner if condition worsens.   Lorenda Peck, DPM

## 2021-08-07 ENCOUNTER — Other Ambulatory Visit: Payer: Self-pay | Admitting: Internal Medicine

## 2021-09-25 ENCOUNTER — Encounter: Payer: Self-pay | Admitting: Neurology

## 2021-10-18 DIAGNOSIS — R1012 Left upper quadrant pain: Secondary | ICD-10-CM | POA: Diagnosis not present

## 2021-10-18 DIAGNOSIS — G35 Multiple sclerosis: Secondary | ICD-10-CM | POA: Diagnosis not present

## 2021-10-18 DIAGNOSIS — K5909 Other constipation: Secondary | ICD-10-CM | POA: Diagnosis not present

## 2021-10-18 DIAGNOSIS — R110 Nausea: Secondary | ICD-10-CM | POA: Diagnosis not present

## 2021-11-04 ENCOUNTER — Other Ambulatory Visit: Payer: Self-pay | Admitting: Internal Medicine

## 2021-11-04 NOTE — Telephone Encounter (Signed)
Please refill as per office routine med refill policy (all routine meds to be refilled for 3 mo or monthly (per pt preference) up to one year from last visit, then month to month grace period for 3 mo, then further med refills will have to be denied) ? ?

## 2021-11-14 ENCOUNTER — Other Ambulatory Visit: Payer: Self-pay | Admitting: Internal Medicine

## 2021-11-14 ENCOUNTER — Encounter: Payer: Self-pay | Admitting: Internal Medicine

## 2021-11-14 NOTE — Telephone Encounter (Signed)
Please refill as per office routine med refill policy (all routine meds to be refilled for 3 mo or monthly (per pt preference) up to one year from last visit, then month to month grace period for 3 mo, then further med refills will have to be denied) ? ?

## 2021-11-15 MED ORDER — AMLODIPINE BESYLATE 10 MG PO TABS: 10.0000 mg | ORAL_TABLET | Freq: Every day | ORAL | 3 refills | Status: DC

## 2021-12-18 DIAGNOSIS — K219 Gastro-esophageal reflux disease without esophagitis: Secondary | ICD-10-CM | POA: Diagnosis not present

## 2021-12-18 DIAGNOSIS — R1012 Left upper quadrant pain: Secondary | ICD-10-CM | POA: Diagnosis not present

## 2021-12-21 NOTE — Progress Notes (Signed)
Follow-up Visit   Date: 12/25/21    Russell Moses. MRN: [id number] DOB: 1970/08/09   Interim History: Russell Bosten Newstrom. is a 51 y.o. right-handed Caucasian male with history of hypertension, bipolar disorder, asthma, herpetic zoster, and anxiety returning to the clinic for follow-up of relapsing remitting multiple sclerosis.  The patient was accompanied to the clinic by self.  History of present illness: In 2014, he developed a tingling sensation over his head and get "swimming headed", which lasted a few seconds and spontaneously resolved. Starting in February 2015, he developed tremors of the hands and bilateral leg weakness. MRI brain in April 2015 showed white matter changes concerning for demyelinating disease.  CSF testing was also supportive of MS.  He was treated with 3-days of IVMP in May 2015 which significantly improved symptoms.  He started copaxone on 10/2013 and has been tolerating it well, besides injection site lipodystrophy.  Annual surveillance imaging has shown stable disease burden and he has not had any relapses since initial diagnosis in Spring 2015.  UPDATE 12/19/2020:  He is here for follow-up visit. He has some difficulty with falling asleep, especially if it is still bright outside. He also complains of on going fatigue. His last B12 level was 288, he is not taking supplements.  He denies vision changes, numbness/tingling, or limb weakness.  He continues to tolerate glatiramer acetate, but has loss of fat in the thigh, so adjusting where he administers the medication.  UPDATE 05/03/2021:   Over the past 2 weeks, he has started to notice new weakness of the arms and legs.  He has difficulty raising his arm with weight, such as lifting a jug, or even keeping his arms raised to drive his car.  He has sensation of weakness in the thighs.  Rest improves his weakness, but there is always a constant feeling of weakness.  No new vision changes or numbness/tingling. No interval  illness, falls, or new medications.    He takes vitamin C, omega three fatty acid, vitamin B12, vitamin D, and vitamin B complex.  UPDATE 12/25/2021: He is here for follow-up visit.  He reports doing well.  He gets fatigued and sleepy intermittently throughout the day, but otherwise is doing well.  No new weakness/numbness/tingling or imbalance.  MRI brain and cervical spine from 2023 was personally viewed with patient and shoes stable disease, no new changes.  He is compliant with Copaxone and tolerating it well. He does endorse accumulation of fat changes under the skin with the injection.    Medications:  Current Outpatient Medications on File Prior to Visit  Medication Sig Dispense Refill   amLODipine (NORVASC) 10 MG tablet Take 1 tablet (10 mg total) by mouth daily. 90 tablet 3   aspirin EC 81 MG tablet Take 1 tablet (81 mg total) by mouth daily. 90 tablet 11   diazepam (VALIUM) 5 MG tablet Take 1 tablet 30-min prior MRI. 2 tablet 0   Glatiramer Acetate 40 MG/ML SOSY INJECT 1 SYRINGE SUBCUTANEOUSLY THREE TIMES A WEEK 12 mL 11   Omeprazole 20 MG TBDD Take by mouth.     telmisartan (MICARDIS) 80 MG tablet Take 1 tablet (80 mg total) by mouth daily. (Patient taking differently: Take 80 mg by mouth daily. Pt takes 40 mg) 90 tablet 2   No current facility-administered medications on file prior to visit.    Allergies:  Allergies  Allergen Reactions   Zyrtec [Cetirizine] Other (See Comments)    Fatigued, felt bad, weak  Penicillins Itching and Rash    Little bumps     Vital Signs:  BP (!) 155/74   Pulse (!) 56   Ht 5\' 7"  (1.702 m)   Wt 195 lb (88.5 kg)   SpO2 97%   BMI 30.54 kg/m   Neurological Exam: MENTAL STATUS including orientation to time, place, person, recent and remote memory, attention span and concentration, language, and fund of knowledge is normal.  Speech is not dysarthric.  CRANIAL NERVES: No visual field defects.  Pupils equal round and reactive to light.   Normal conjugate, extra-ocular eye movements in all directions of gaze.  No ptosis. Normal facial sensation.  Face is symmetric. Palate elevates symmetrically.  Tongue is midline.  MOTOR:  Motor strength is 5/5 in all extremities.  No atrophy, fasciculations or abnormal movements.  No pronator drift.  Tone is normal.    MSRs:  Reflexes are 2+/4 throughout.  SENSORY:  Intact to vibration throughout.  COORDINATION/GAIT:  Normal finger-to- nose-finger and heel-to-shin.  Intact rapid alternating movements bilaterally.  Gait narrow based and stable.    Data: MRI brain wwo contrast 05/29/2021:  No cervical cord lesions.  MRI cervical spine wo contrast 05/29/2021:  Stable burden of cerebral white matter disease compatible with history of multiple sclerosis. No abnormal enhancement.  MRI brain wwo contrast 01/04/2020:  No change  MRI brain wwo contrast 11/03/2018:  No new lesions compared with December 02, 2017.  No lesions with acute features.   MRI brain wwo contrast 11/28/2016:   Essentially stable findings of multiple sclerosis. No acute intracranial abnormality.  MRI brain wwo contrast 11/17/2015:  No significant changes since 12/16/2014.  No abnormal enhancement.  MRI brain wwo contrast 12/16/2014: 1.  Unchanged supratentorial white matter lesions.  No definite new lesions.  No abnormal enhancement or restricted diffusion. 2.  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 3.  Paransal sinus inflammatory change, correlate clinically.  MRI brain wwo contrast 12/25/2013:  Multiple lesoins in the periventricular and juxtacortical white matter regions bilaterally.  There is no longer enhancement or diffusion abnormality.  No new lesions MRI cervical spine wwo contrast:  Normal  MRI brain wwo contrast 05/28/2013:  Multiple lesions in the periventricular and juxtacortical white matter bilaterally, one of which enhances.  The  pattern and distribution of these lesions suggest demyelinating disease such as multiple sclerosis.  MRI cervical spine wwo contrast 06/04/2013:  Normal MRI cervical spine   EMG right upper and lower extremity 05/14/2013:  There is electrophysiological evidence of an intraspinal canal lesion (i.e. radiculopathy) affecting the L5 nerve root/segment bilaterally. Overall, these changes are mild in degree electrically and worse on the right side. There is no evidence of a generalized sensorimotor polyneuropathy, cervical radiculopathy or diffuse myopathy.   CSF 06/22/2013: R0 W0 G61 P27  ACE 4, Lyme neg, MBP 61, IgG 1.6 OCB The patient's CSF contains >5 well defined gamma restriction bands that are also present in the patient's  corresponding serum sample, but some bands in the CSF are more prominent.  CSF cytology with few mononuclear cells  Lab Results  Component Value Date   TSH 0.98 06/05/2021   Lab Results  Component Value Date   VITAMINB12 288 05/30/2020    IMPRESSION/PLAN: Relapsing-remitting multiple sclerosis, diagnosed 2015 presenting with generalized fatigue and tremors.  MRI brain shows periventricular and juxtacortical white matter lesions, which has been stable on imaging.  He has been on glatiramer  acetate 40mg  SQ three times weekly since 2015, without relapse.  He has side effect of lipodystrophy, but has been doing well and does not want to change therapy.   MRI brain from April 2023 was reveiwed and shows stable disease, no new or active lesions.  There is no disease of the cervical cord.  Continue glatiramer acetate 40mg  SQ three times weekly and vitamin D supplements  Check MRI brain and cervical spine wwo contrast in April 2024.   Return to clinic in 6 months (May 2024).   Thank you for allowing me to participate in patient's care.  If I can answer any additional questions, I would be pleased to do so.    Sincerely,    Brantley Naser K. May 2024, DO

## 2021-12-25 ENCOUNTER — Ambulatory Visit: Payer: BC Managed Care – PPO | Admitting: Neurology

## 2021-12-25 ENCOUNTER — Encounter: Payer: Self-pay | Admitting: Neurology

## 2021-12-25 VITALS — BP 155/74 | HR 56 | Ht 67.0 in | Wt 195.0 lb

## 2021-12-25 DIAGNOSIS — G35 Multiple sclerosis: Secondary | ICD-10-CM | POA: Diagnosis not present

## 2021-12-25 NOTE — Patient Instructions (Signed)
MRI brain and cervical spine wwo contrast  Return to clinic in May

## 2022-01-23 DIAGNOSIS — K449 Diaphragmatic hernia without obstruction or gangrene: Secondary | ICD-10-CM | POA: Diagnosis not present

## 2022-01-23 DIAGNOSIS — K635 Polyp of colon: Secondary | ICD-10-CM | POA: Diagnosis not present

## 2022-01-23 DIAGNOSIS — Z1211 Encounter for screening for malignant neoplasm of colon: Secondary | ICD-10-CM | POA: Diagnosis not present

## 2022-01-23 DIAGNOSIS — D122 Benign neoplasm of ascending colon: Secondary | ICD-10-CM | POA: Diagnosis not present

## 2022-01-23 DIAGNOSIS — D120 Benign neoplasm of cecum: Secondary | ICD-10-CM | POA: Diagnosis not present

## 2022-01-23 DIAGNOSIS — I10 Essential (primary) hypertension: Secondary | ICD-10-CM | POA: Diagnosis not present

## 2022-01-23 DIAGNOSIS — D126 Benign neoplasm of colon, unspecified: Secondary | ICD-10-CM | POA: Diagnosis not present

## 2022-01-23 DIAGNOSIS — G35 Multiple sclerosis: Secondary | ICD-10-CM | POA: Diagnosis not present

## 2022-01-23 DIAGNOSIS — K5730 Diverticulosis of large intestine without perforation or abscess without bleeding: Secondary | ICD-10-CM | POA: Diagnosis not present

## 2022-01-23 DIAGNOSIS — K219 Gastro-esophageal reflux disease without esophagitis: Secondary | ICD-10-CM | POA: Diagnosis not present

## 2022-04-02 ENCOUNTER — Encounter: Payer: Self-pay | Admitting: Internal Medicine

## 2022-04-03 MED ORDER — TELMISARTAN 80 MG PO TABS: 80.0000 mg | ORAL_TABLET | Freq: Every day | ORAL | 0 refills | Status: DC

## 2022-04-05 ENCOUNTER — Other Ambulatory Visit: Payer: Self-pay | Admitting: Neurology

## 2022-04-16 ENCOUNTER — Encounter: Payer: Self-pay | Admitting: Neurology

## 2022-04-17 MED ORDER — DIAZEPAM 5 MG PO TABS: ORAL_TABLET | ORAL | 0 refills | Status: DC

## 2022-05-07 ENCOUNTER — Ambulatory Visit
Admission: RE | Admit: 2022-05-07 | Discharge: 2022-05-07 | Disposition: A | Discharge status: Home / Self Care | Payer: BC Managed Care – PPO | Source: Ambulatory Visit | Attending: Neurology | Admitting: Neurology

## 2022-05-07 DIAGNOSIS — G35 Multiple sclerosis: Secondary | ICD-10-CM

## 2022-05-07 MED ORDER — GADOPICLENOL 0.5 MMOL/ML IV SOLN
9.0000 mL | Freq: Once | INTRAVENOUS | Status: AC | PRN
  Administered 2022-05-07: 9 mL via INTRAVENOUS

## 2022-05-10 ENCOUNTER — Telehealth: Payer: Self-pay | Admitting: Neurology

## 2022-05-10 NOTE — Telephone Encounter (Signed)
Pt called in returning Mahina's call about results. He gets off of work at 3 and should be able to answer after that.

## 2022-05-10 NOTE — Telephone Encounter (Signed)
See result note.  

## 2022-05-11 ENCOUNTER — Telehealth: Payer: Self-pay

## 2022-05-11 NOTE — Telephone Encounter (Signed)
MRI Cervical Spine results:  MRI cervical spine looks good, no lesions. there is some movement artifact, but all in all looks good.   Called patient and informed him of results. Patient verbalized understanding.

## 2022-06-11 ENCOUNTER — Encounter: Payer: Self-pay | Admitting: Internal Medicine

## 2022-06-11 ENCOUNTER — Ambulatory Visit (INDEPENDENT_AMBULATORY_CARE_PROVIDER_SITE_OTHER): Payer: BC Managed Care – PPO | Admitting: Internal Medicine

## 2022-06-11 ENCOUNTER — Other Ambulatory Visit (INDEPENDENT_AMBULATORY_CARE_PROVIDER_SITE_OTHER): Payer: BC Managed Care – PPO

## 2022-06-11 VITALS — BP 126/84 | HR 78 | Temp 98.1°F | Ht 67.0 in | Wt 199.0 lb

## 2022-06-11 DIAGNOSIS — Z0001 Encounter for general adult medical examination with abnormal findings: Secondary | ICD-10-CM | POA: Diagnosis not present

## 2022-06-11 DIAGNOSIS — E559 Vitamin D deficiency, unspecified: Secondary | ICD-10-CM

## 2022-06-11 DIAGNOSIS — E538 Deficiency of other specified B group vitamins: Secondary | ICD-10-CM

## 2022-06-11 DIAGNOSIS — Z0000 Encounter for general adult medical examination without abnormal findings: Secondary | ICD-10-CM | POA: Diagnosis not present

## 2022-06-11 DIAGNOSIS — R739 Hyperglycemia, unspecified: Secondary | ICD-10-CM

## 2022-06-11 DIAGNOSIS — E7800 Pure hypercholesterolemia, unspecified: Secondary | ICD-10-CM

## 2022-06-11 DIAGNOSIS — I10 Essential (primary) hypertension: Secondary | ICD-10-CM

## 2022-06-11 LAB — CBC WITH DIFFERENTIAL/PLATELET
Basophils Absolute: 0 10*3/uL (ref 0.0–0.1)
Basophils Relative: 0.9 % (ref 0.0–3.0)
Eosinophils Absolute: 0.2 10*3/uL (ref 0.0–0.7)
Eosinophils Relative: 4.1 % (ref 0.0–5.0)
HCT: 42.2 % (ref 39.0–52.0)
Hemoglobin: 14.5 g/dL (ref 13.0–17.0)
Lymphocytes Relative: 40.7 % (ref 12.0–46.0)
Lymphs Abs: 1.8 10*3/uL (ref 0.7–4.0)
MCHC: 34.4 g/dL (ref 30.0–36.0)
MCV: 85.9 fl (ref 78.0–100.0)
Monocytes Absolute: 0.4 10*3/uL (ref 0.1–1.0)
Monocytes Relative: 8.4 % (ref 3.0–12.0)
Neutro Abs: 2 10*3/uL (ref 1.4–7.7)
Neutrophils Relative %: 45.9 % (ref 43.0–77.0)
Platelets: 182 10*3/uL (ref 150.0–400.0)
RBC: 4.92 Mil/uL (ref 4.22–5.81)
RDW: 13.7 % (ref 11.5–15.5)
WBC: 4.4 10*3/uL (ref 4.0–10.5)

## 2022-06-11 LAB — HEPATIC FUNCTION PANEL
ALT: 36 U/L (ref 0–53)
AST: 23 U/L (ref 0–37)
Albumin: 4 g/dL (ref 3.5–5.2)
Alkaline Phosphatase: 120 U/L — ABNORMAL HIGH (ref 39–117)
Bilirubin, Direct: 0.1 mg/dL (ref 0.0–0.3)
Total Bilirubin: 0.4 mg/dL (ref 0.2–1.2)
Total Protein: 6.5 g/dL (ref 6.0–8.3)

## 2022-06-11 LAB — LIPID PANEL
Cholesterol: 127 mg/dL (ref 0–200)
HDL: 32.7 mg/dL — ABNORMAL LOW (ref >39.00)
LDL Cholesterol: 79 mg/dL (ref 0–99)
NonHDL: 94.55
Total CHOL/HDL Ratio: 4
Triglycerides: 80 mg/dL (ref 0.0–149.0)
VLDL: 16 mg/dL (ref 0.0–40.0)

## 2022-06-11 LAB — VITAMIN B12: Vitamin B-12: 275 pg/mL (ref 211–911)

## 2022-06-11 LAB — URINALYSIS, ROUTINE W REFLEX MICROSCOPIC
Bilirubin Urine: NEGATIVE
Hgb urine dipstick: NEGATIVE
Ketones, ur: NEGATIVE
Leukocytes,Ua: NEGATIVE
Nitrite: NEGATIVE
RBC / HPF: NONE SEEN (ref 0–?)
Specific Gravity, Urine: <=1.005 — AB (ref 1.000–1.030)
Total Protein, Urine: NEGATIVE
Urine Glucose: NEGATIVE
Urobilinogen, UA: 0.2 (ref 0.0–1.0)
WBC, UA: NONE SEEN (ref 0–?)
pH: 6 (ref 5.0–8.0)

## 2022-06-11 LAB — PSA: PSA: 0.36 ng/mL (ref 0.10–4.00)

## 2022-06-11 LAB — BASIC METABOLIC PANEL
BUN: 10 mg/dL (ref 6–23)
CO2: 22 mEq/L (ref 19–32)
Calcium: 9.3 mg/dL (ref 8.4–10.5)
Chloride: 107 mEq/L (ref 96–112)
Creatinine, Ser: 0.87 mg/dL (ref 0.40–1.50)
GFR: 98.82 mL/min (ref >60.00)
Glucose, Bld: 114 mg/dL — ABNORMAL HIGH (ref 70–99)
Potassium: 4.2 mEq/L (ref 3.5–5.1)
Sodium: 140 mEq/L (ref 135–145)

## 2022-06-11 LAB — TSH: TSH: 1.3 u[IU]/mL (ref 0.35–5.50)

## 2022-06-11 LAB — HEMOGLOBIN A1C: Hgb A1c MFr Bld: 5.9 % (ref 4.6–6.5)

## 2022-06-11 LAB — VITAMIN D 25 HYDROXY (VIT D DEFICIENCY, FRACTURES): VITD: 28.31 ng/mL — ABNORMAL LOW (ref 30.00–100.00)

## 2022-06-11 NOTE — Patient Instructions (Signed)
Please continue all other medications as before, and refills have been done if requested.  Please have the pharmacy call with any other refills you may need.  Please continue your efforts at being more active, low cholesterol diet, and weight control.  You are otherwise up to date with prevention measures today.  Please keep your appointments with your specialists as you may have planned  Your lab work is pending from this mornings draw  Please make an Appointment to return in 6 months, or sooner if needed, also with Lab Appointment for testing done 3-5 days before at the FIRST FLOOR Lab (so this is for TWO appointments - please see the scheduling desk as you leave)

## 2022-06-11 NOTE — Progress Notes (Unsigned)
Patient ID: Russell Moses., male   DOB: 22-May-1970, 52 y.o.   MRN: [id number]         Chief Complaint:: wellness exam and low b12, hyperglycemia, hld, htn, low vit d       HPI:  Russell Moses. is a 52 y.o. male here for wellness exam; declines tdap , hep c screen, hiv screen, o/w up to date                        Also s/p colonoscopy with 3 polyps, for f/u at 2 yrs.  Also had EGD with Dr Gerlean Ren.  Pt denies chest pain, increased sob or doe, wheezing, orthopnea, PND, increased LE swelling, palpitations, dizziness or syncope.   Pt denies polydipsia, polyuria, or new focal neuro s/s.    Pt denies fever, wt loss, night sweats, loss of appetite, or other constitutional symptoms  Hard to lose wt, admits to less rigourous diet recently.    Wt Readings from Last 3 Encounters:  06/11/22 199 lb (90.3 kg)  12/25/21 195 lb (88.5 kg)  06/05/21 194 lb (88 kg)   BP Readings from Last 3 Encounters:  06/11/22 126/84  12/25/21 (!) 155/74  06/05/21 132/70   There is no immunization history on file for this patient. Health Maintenance Due  Topic Date Due   DTaP/Tdap/Td (1 - Tdap) Never done      Past Medical History:  Diagnosis Date   ASTHMA, CHILDHOOD 04/18/2009   Qualifier: Diagnosis of  By: Jonny Ruiz MD, Len Blalock    BIPOLAR DISORDER UNSPECIFIED 04/18/2009   Qualifier: Diagnosis of  By: Jonny Ruiz MD, Len Blalock    GERD (gastroesophageal reflux disease) 07/30/2013   Hypertension    Insomnia    Multiple sclerosis (HCC) 2015   Psoriasis 12/15/2013   Ventral hernia without obstruction or gangrene 12/15/2013   Vitamin D deficiency 03/18/2019   Past Surgical History:  Procedure Laterality Date   CHOLECYSTECTOMY  09/2020   HERNIA REPAIR      reports that he has never smoked. He has never used smokeless tobacco. He reports that he does not drink alcohol and does not use drugs. family history includes COPD in his sister; Diabetes in his father; Fibromyalgia in his sister; Healthy in his sister; Heart disease  in his father; Hypertension in his father and mother; Parkinson's disease in his father; Stroke in his father. Allergies  Allergen Reactions   Zyrtec [Cetirizine] Other (See Comments)    Fatigued, felt bad, weak   Penicillins Itching and Rash    Little bumps   Current Outpatient Medications on File Prior to Visit  Medication Sig Dispense Refill   amLODipine (NORVASC) 10 MG tablet Take 1 tablet (10 mg total) by mouth daily. 90 tablet 3   aspirin EC 81 MG tablet Take 1 tablet (81 mg total) by mouth daily. 90 tablet 11   diazepam (VALIUM) 5 MG tablet Take 1 tablet 30-min prior MRI. 2 tablet 0   Glatiramer Acetate (GLATOPA) 40 MG/ML SOSY INJECT 1 SYRINGE SUBCUTANEOUSLY THREE TIMES A WEEK. 12 mL 11   Omeprazole 20 MG TBDD Take by mouth.     telmisartan (MICARDIS) 80 MG tablet Take 1 tablet (80 mg total) by mouth daily. Annual appt due in May must see provider for future refills 90 tablet 0   No current facility-administered medications on file prior to visit.        ROS:  All others reviewed and negative.  Objective  PE:  BP 126/84 (BP Location: Right Arm, Patient Position: Sitting, Cuff Size: Normal)   Pulse 78   Temp 98.1 F (36.7 C) (Oral)   Ht 5\' 7"  (1.702 m)   Wt 199 lb (90.3 kg)   SpO2 97%   BMI 31.17 kg/m                 Constitutional: Pt appears in NAD               HENT: Head: NCAT.                Right Ear: External ear normal.                 Left Ear: External ear normal.                Eyes: . Pupils are equal, round, and reactive to light. Conjunctivae and EOM are normal               Nose: without d/c or deformity               Neck: Neck supple. Gross normal ROM               Cardiovascular: Normal rate and regular rhythm.                 Pulmonary/Chest: Effort normal and breath sounds without rales or wheezing.                Abd:  Soft, NT, ND, + BS, no organomegaly               Neurological: Pt is alert. At baseline orientation, motor grossly intact                Skin: Skin is warm. No rashes, no other new lesions, LE edema - none               Psychiatric: Pt behavior is normal without agitation   Micro: none  Cardiac tracings I have personally interpreted today:  none  Pertinent Radiological findings (summarize): none   Lab Results  Component Value Date   WBC 4.4 06/11/2022   HGB 14.5 06/11/2022   HCT 42.2 06/11/2022   PLT 182.0 06/11/2022   GLUCOSE 114 (H) 06/11/2022   CHOL 127 06/11/2022   TRIG 80.0 06/11/2022   HDL 32.70 (L) 06/11/2022   LDLCALC 79 06/11/2022   ALT 36 06/11/2022   AST 23 06/11/2022   NA 140 06/11/2022   K 4.2 06/11/2022   CL 107 06/11/2022   CREATININE 0.87 06/11/2022   BUN 10 06/11/2022   CO2 22 06/11/2022   TSH 1.30 06/11/2022   PSA 0.36 06/11/2022   HGBA1C 5.9 06/11/2022   Assessment/Plan:  Russell Moses. is a 52 y.o. White or Caucasian [1] male with  has a past medical history of ASTHMA, CHILDHOOD (04/18/2009), BIPOLAR DISORDER UNSPECIFIED (04/18/2009), GERD (gastroesophageal reflux disease) (07/30/2013), Hypertension, Insomnia, Multiple sclerosis (HCC) (2015), Psoriasis (12/15/2013), Ventral hernia without obstruction or gangrene (12/15/2013), and Vitamin D deficiency (03/18/2019).  Encounter for well adult exam with abnormal findings Age and sex appropriate education and counseling updated with regular exercise and diet Referrals for preventative services - declines hep c and hiv screen Immunizations addressed - declines tdap Smoking counseling  - none needed Evidence for depression or other mood disorder - stable bipolar recent Most recent labs reviewed. I have personally reviewed and have noted: 1) the patient's medical and social history  2) The patient's current medications and supplements 3) The patient's height, weight, and BMI have been recorded in the chart   B12 deficiency Lab Results  Component Value Date   VITAMINB12 275 06/11/2022   Low, to start oral replacement - b12 1000  mcg qd   Hyperglycemia Lab Results  Component Value Date   HGBA1C 5.9 06/11/2022   Stable, pt to continue current medical treatment  - diet, wt control   Hyperlipidemia Lab Results  Component Value Date   LDLCALC 79 06/11/2022   Stable, pt to continue low chol diet   Hypertension BP Readings from Last 3 Encounters:  06/11/22 126/84  12/25/21 (!) 155/74  06/05/21 132/70   Stable, pt to continue medical treatment norvasc 10 mg qd, micardis 80 mg qd   Vitamin D deficiency Last vitamin D Lab Results  Component Value Date   VD25OH 28.31 (L) 06/11/2022   Low, to start oral replacement  Followup: Return in about 6 months (around 12/12/2022).  Oliver Barre, MD 06/12/2022 8:08 PM [facility]

## 2022-06-11 NOTE — Assessment & Plan Note (Deleted)
Last vitamin D ?Lab Results  ?Component Value Date  ? VD25OH 42.93 06/05/2021  ? ?Stable, cont oral replacement ? ?

## 2022-06-12 ENCOUNTER — Encounter: Payer: Self-pay | Admitting: Internal Medicine

## 2022-06-12 NOTE — Assessment & Plan Note (Signed)
Lab Results  Component Value Date   HGBA1C 5.9 06/11/2022   Stable, pt to continue current medical treatment  - diet, wt control

## 2022-06-12 NOTE — Assessment & Plan Note (Signed)
Lab Results  Component Value Date   LDLCALC 79 06/11/2022   Stable, pt to continue low chol diet

## 2022-06-12 NOTE — Assessment & Plan Note (Signed)
BP Readings from Last 3 Encounters:  06/11/22 126/84  12/25/21 (!) 155/74  06/05/21 132/70   Stable, pt to continue medical treatment norvasc 10 mg qd, micardis 80 mg qd

## 2022-06-12 NOTE — Assessment & Plan Note (Signed)
Age and sex appropriate education and counseling updated with regular exercise and diet Referrals for preventative services - declines hep c and hiv screen Immunizations addressed - declines tdap Smoking counseling  - none needed Evidence for depression or other mood disorder - stable bipolar recent Most recent labs reviewed. I have personally reviewed and have noted: 1) the patient's medical and social history 2) The patient's current medications and supplements 3) The patient's height, weight, and BMI have been recorded in the chart

## 2022-06-12 NOTE — Assessment & Plan Note (Signed)
Last vitamin D Lab Results  Component Value Date   VD25OH 28.31 (L) 06/11/2022   Low, to start oral replacement

## 2022-06-12 NOTE — Assessment & Plan Note (Signed)
Lab Results  Component Value Date   VITAMINB12 275 06/11/2022   Low, to start oral replacement - b12 1000 mcg qd

## 2022-06-12 NOTE — Addendum Note (Signed)
Addended by: Corwin Levins on: 06/12/2022 08:10 PM   Modules accepted: Orders

## 2022-06-18 ENCOUNTER — Ambulatory Visit: Payer: BC Managed Care – PPO | Admitting: Neurology

## 2022-06-18 ENCOUNTER — Encounter: Payer: Self-pay | Admitting: Neurology

## 2022-06-18 VITALS — BP 142/78 | HR 63 | Ht 67.0 in | Wt 201.0 lb

## 2022-06-18 DIAGNOSIS — G35 Multiple sclerosis: Secondary | ICD-10-CM

## 2022-06-18 NOTE — Progress Notes (Signed)
Follow-up Visit   Date: 06/18/22    Russell Moses. MRN: [id number] DOB: 1970/12/15   Interim History: Russell Lido Frosch. is a 52 y.o. right-handed Caucasian male with history of hypertension, bipolar disorder, asthma, herpetic zoster, and anxiety returning to the clinic for follow-up of relapsing remitting multiple sclerosis.  The patient was accompanied to the clinic by self.  History of present illness: In 2014, he developed a tingling sensation over his head and get "swimming headed", which lasted a few seconds and spontaneously resolved. Starting in February 2015, he developed tremors of the hands and bilateral leg weakness. MRI brain in April 2015 showed white matter changes concerning for demyelinating disease.  CSF testing was also supportive of MS.  He was treated with 3-days of IVMP in May 2015 which significantly improved symptoms.  He started copaxone on 10/2013 and has been tolerating it well, besides injection site lipodystrophy.  Annual surveillance imaging has shown stable disease burden and he has not had any relapses since initial diagnosis in Spring 2015.  UPDATE 06/18/2022:  He is doing well. He continues to be compliant with glatiramer acetate injections but has noticed increased lipodystrophy, so woud like to explore other options. He is taking L-threonine supplement which helps him feel calm and has improved his sleep.  MRI brain is stable without progression.  He denies any new neurological symptoms.   Medications:  Current Outpatient Medications on File Prior to Visit  Medication Sig Dispense Refill   amLODipine (NORVASC) 10 MG tablet Take 1 tablet (10 mg total) by mouth daily. 90 tablet 3   aspirin EC 81 MG tablet Take 1 tablet (81 mg total) by mouth daily. 90 tablet 11   diazepam (VALIUM) 5 MG tablet Take 1 tablet 30-min prior MRI. 2 tablet 0   Glatiramer Acetate (GLATOPA) 40 MG/ML SOSY INJECT 1 SYRINGE SUBCUTANEOUSLY THREE TIMES A WEEK. 12 mL 11   Omeprazole 20 MG  TBDD Take by mouth.     telmisartan (MICARDIS) 80 MG tablet Take 1 tablet (80 mg total) by mouth daily. Annual appt due in May must see provider for future refills 90 tablet 0   No current facility-administered medications on file prior to visit.    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  Speech is not dysarthric.  CRANIAL NERVES: No visual field defects.  Pupils equal round and reactive to light.  Normal conjugate, extra-ocular eye movements in all directions of gaze.  No ptosis. Normal facial sensation.  Face is symmetric. Palate elevates symmetrically.  Tongue is midline.  MOTOR:  Motor strength is 5/5 in all extremities.  No atrophy, fasciculations or abnormal movements.  No pronator drift.  Tone is normal.    MSRs:  Reflexes are 2+/4 throughout.  SENSORY:  Intact to vibration throughout.  COORDINATION/GAIT:  Normal finger-to- nose-finger and heel-to-shin.  Intact rapid alternating movements bilaterally.  Gait narrow based and stable.  Data: MRI cervical spine wwo contrast 05/09/2022: Motion degraded examination. No evidence of demyelinating disease in the cervical spinal cord.  MRI brain wwo contrast 05/09/2022: Unchanged cerebral white matter disease consistent with multiple sclerosis. No evidence of active demyelination.  MRI brain wwo contrast 05/29/2021:  No cervical cord lesions.  MRI cervical spine wo contrast 05/29/2021:  Stable burden of cerebral white matter disease compatible with  history of multiple sclerosis. No abnormal enhancement.  MRI brain wwo contrast 01/04/2020:  No change  MRI brain wwo contrast 11/03/2018:  No new lesions compared with December 02, 2017.  No lesions with acute features.   MRI brain wwo contrast 11/28/2016:   Essentially stable findings of multiple sclerosis. No acute intracranial abnormality.  MRI brain wwo contrast 11/17/2015:  No significant changes since 12/16/2014.  No abnormal enhancement.  MRI brain wwo contrast 12/16/2014: 1.  Unchanged supratentorial white matter lesions.  No definite new lesions.  No abnormal enhancement or restricted diffusion. 2.  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 3.  Paransal sinus inflammatory change, correlate clinically.  MRI brain wwo contrast 12/25/2013:  Multiple lesoins in the periventricular and juxtacortical white matter regions bilaterally.  There is no longer enhancement or diffusion abnormality.  No new lesions MRI cervical spine wwo contrast:  Normal  MRI brain wwo contrast 05/28/2013:  Multiple lesions in the periventricular and juxtacortical white matter bilaterally, one of which enhances.  The pattern and distribution of these lesions suggest demyelinating disease such as multiple sclerosis.  MRI cervical spine wwo contrast 06/04/2013:  Normal MRI cervical spine   EMG right upper and lower extremity 05/14/2013:  There is electrophysiological evidence of an intraspinal canal lesion (i.e. radiculopathy) affecting the L5 nerve root/segment bilaterally. Overall, these changes are mild in degree electrically and worse on the right side. There is no evidence of a generalized sensorimotor polyneuropathy, cervical radiculopathy or diffuse myopathy.   CSF 06/22/2013: R0 W0 G61 P27  ACE 4, Lyme neg, MBP 61, IgG 1.6 OCB The patient's CSF contains >5 well defined gamma restriction bands that are also present in the patient's   corresponding serum sample, but some bands in the CSF are more prominent.  CSF cytology with few mononuclear cells  Lab Results  Component Value Date   TSH 1.30 06/11/2022   Lab Results  Component Value Date   VITAMINB12 275 06/11/2022    IMPRESSION/PLAN: Relapsing-remitting multiple sclerosis, diagnosed 2015 presenting with generalized fatigue and tremors.  MRI brain shows periventricular and juxtacortical white matter lesions, which has been stable on imaging.  He has been on glatiramer acetate 40mg  SQ three times weekly since 2015, without relapse.  He has side effects of lipodystrophy and would like to consider alternative options.  He does a lot of research regarding specific amino acids which he finds to help his condition.  I have provided him with a list of alternative oral options as well as plegrity.  He will contact me regarding his preference after researching.  MRI brain from April 2024 was reveiwed and shows stable disease, no new or active lesions.  There is no disease of the cervical cord.  For now, continue glatiramer acetate 40mg  SQ three times weekly and vitamin D supplements  Return to clinic in 6 months or sooner as needed  Total time spent reviewing records, interview, history/exam, documentation, and coordination of care on day of encounter:  30 min   Thank you for allowing  me to participate in patient's care.  If I can answer any additional questions, I would be pleased to do so.    Sincerely,    Dianelys Scinto K. Posey Pronto, DO

## 2022-06-18 NOTE — Patient Instructions (Addendum)
Other MS medication options: - Plegrity (self injection every 14 days) - Gilenya, Tecfidera, Aubagio, Mayzant, Zeposia (oral)  Return to clinic in 6 months

## 2022-06-29 ENCOUNTER — Other Ambulatory Visit: Payer: Self-pay | Admitting: Internal Medicine

## 2022-06-29 ENCOUNTER — Other Ambulatory Visit: Payer: Self-pay

## 2022-08-26 ENCOUNTER — Encounter: Payer: Self-pay | Admitting: Internal Medicine

## 2022-08-26 DIAGNOSIS — E531 Pyridoxine deficiency: Secondary | ICD-10-CM

## 2022-08-30 IMAGING — MR MR CERVICAL SPINE WO/W CM
6 of 8 series · 30 of 48 positions shown · IV contrast (17 ml multihance)
Comparison: 0910 images only

CLINICAL DATA: Multiple sclerosis

EXAM:
MRI CERVICAL SPINE WITHOUT AND WITH CONTRAST
TECHNIQUE: Multiplanar and multiecho pulse sequences of the cervical spine, to
include the craniocervical junction and cervicothoracic junction,
were obtained without and with intravenous contrast.
CONTRAST:  17mL MULTIHANCE GADOBENATE DIMEGLUMINE 529 MG/ML IV SOLN

[Series 2: T1 · sagittal · 3.0mm · 0.39mm/px · 4 of 13 slices shown (1 of 2)]
[im 1/13]
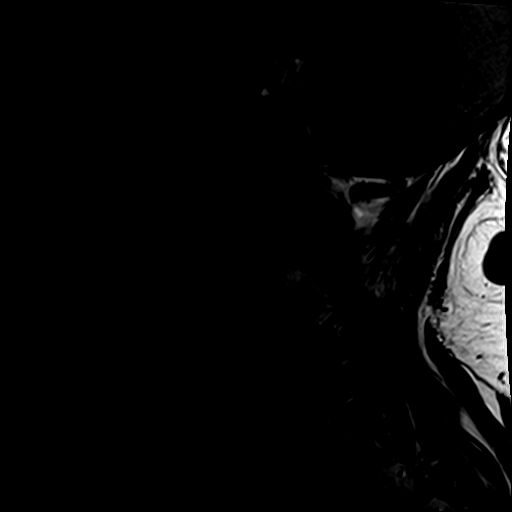
[im 5/13]
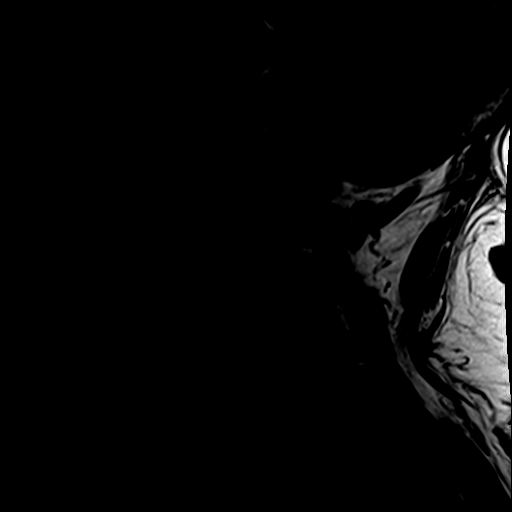
[im 9/13]
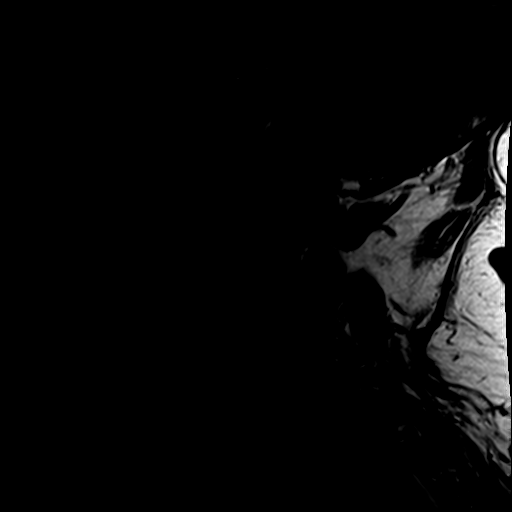
[im 13/13]
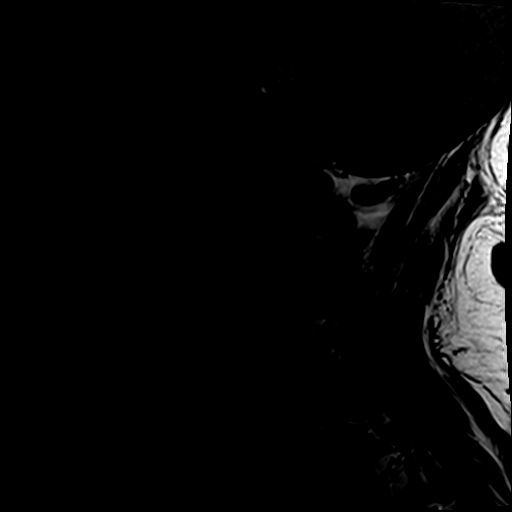

[Series 4: T2 · sagittal · 3.0mm · 0.62mm/px · 4 of 13 slices shown (1 of 2)]
[im 1/13]
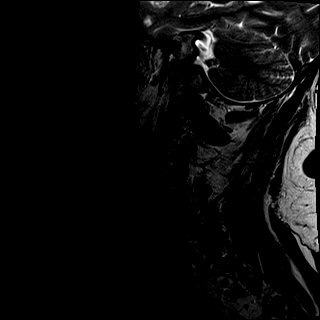
[im 5/13]
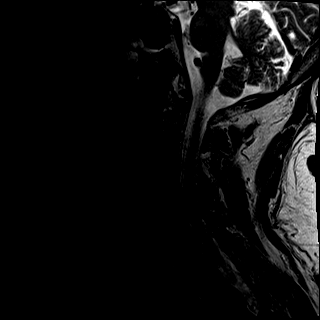
[im 9/13]
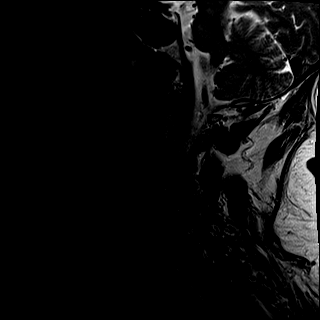
[im 13/13]
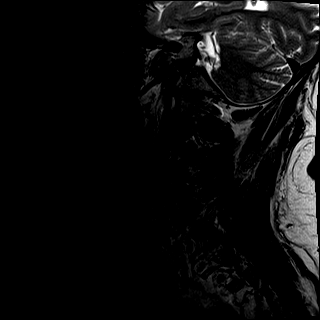

[Series 5: T1 fat-sat post-contrast · sagittal · 3.0mm · 0.78mm/px · 4 of 13 slices shown]
[im 1/13]
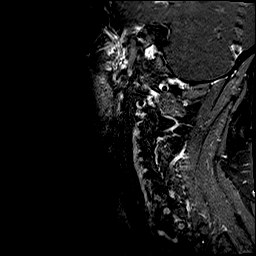
[im 5/13]
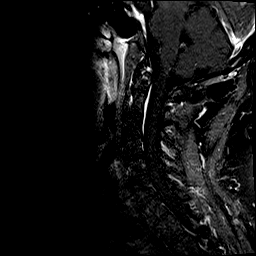
[im 9/13]
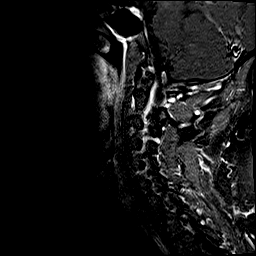
[im 13/13]
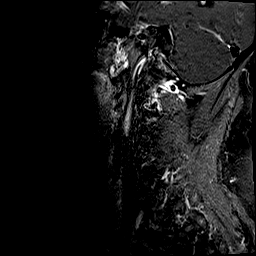

[Series 6: T1 post-contrast · axial · 3.0mm · 0.35mm/px · z∈[-223,-212]mm · 2 of 27 slices shown]
[im 1/27]
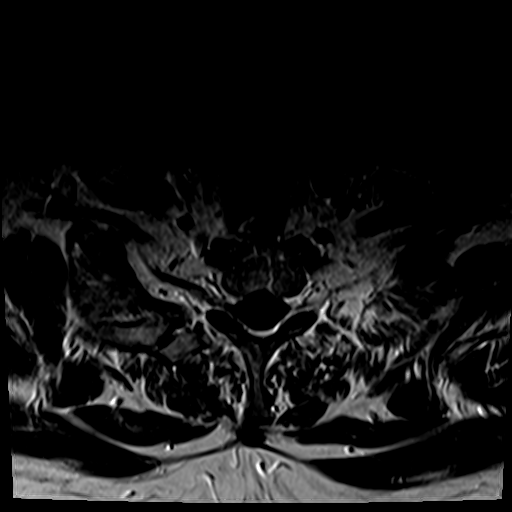
[im 4/27]
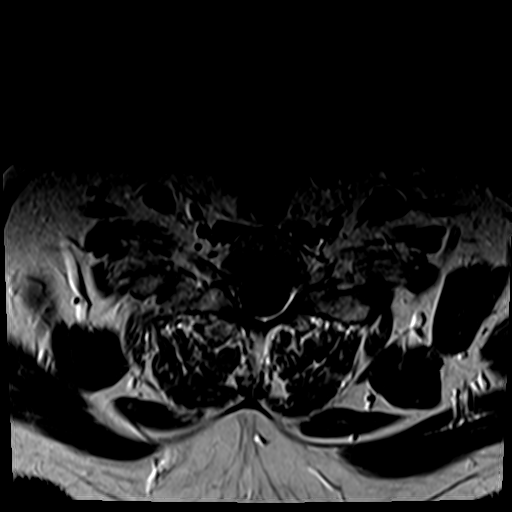

[Series 8: T2 · axial · 3.0mm · 0.70mm/px · z∈[-223,-126]mm · 8 of 27 slices shown (2 of 2)]
[im 1/27]
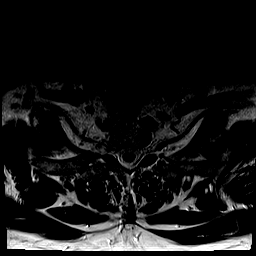
[im 4/27]
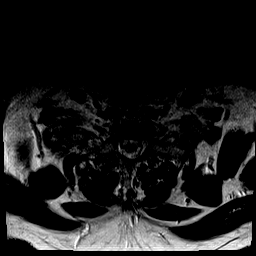
[im 8/27]
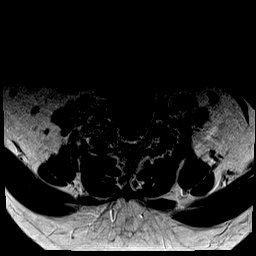
[im 12/27]
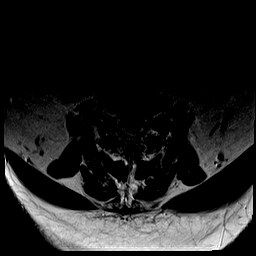
[im 15/27]
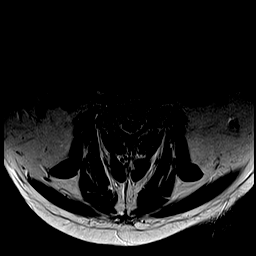
[im 19/27]
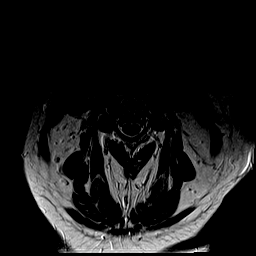
[im 23/27]
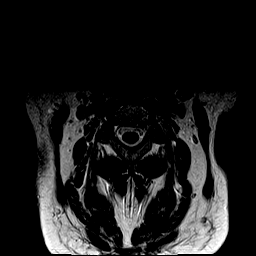
[im 27/27]
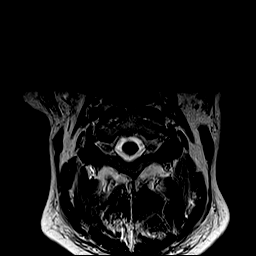

[Series 9: T1 · axial · non-contrast · 3.0mm · 0.35mm/px · z∈[-223,-126]mm · 8 of 27 slices shown (2 of 2)]
[im 1/27]
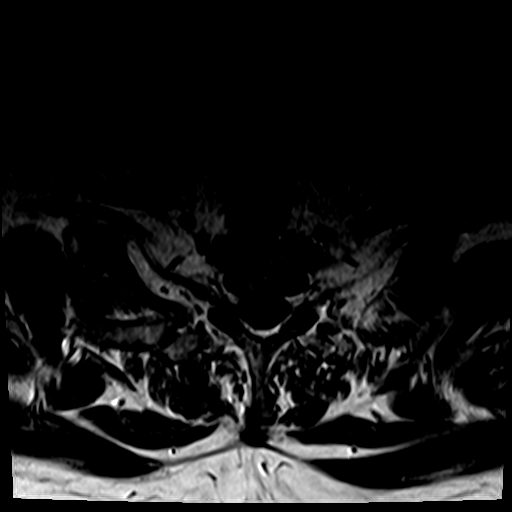
[im 4/27]
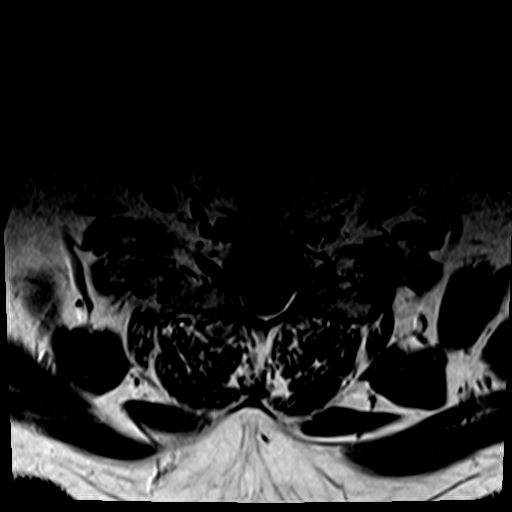
[im 8/27]
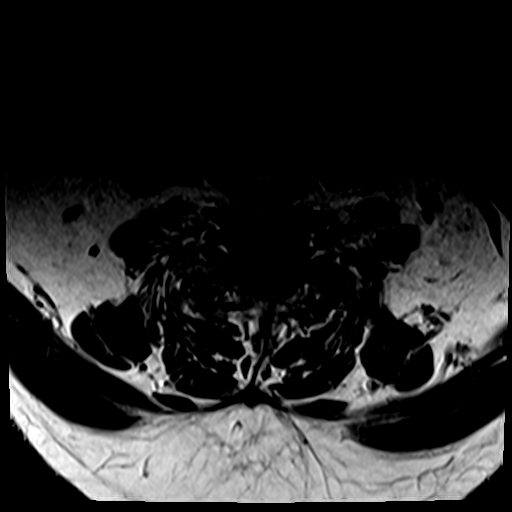
[im 12/27]
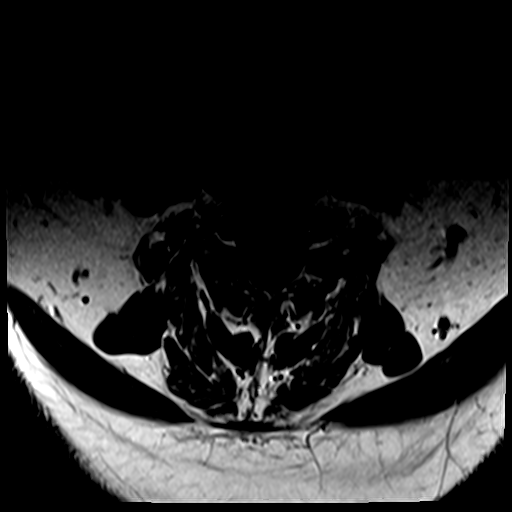
[im 15/27]
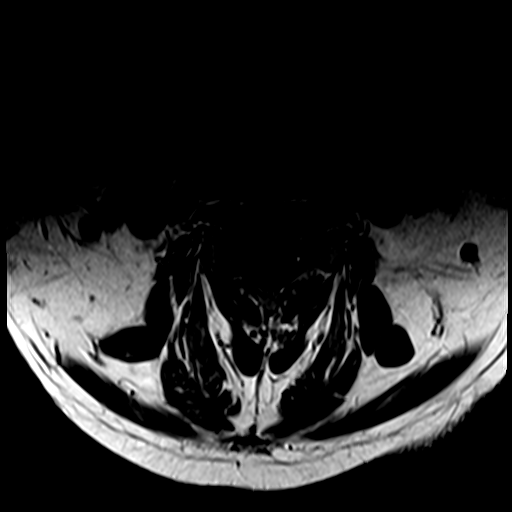
[im 19/27]
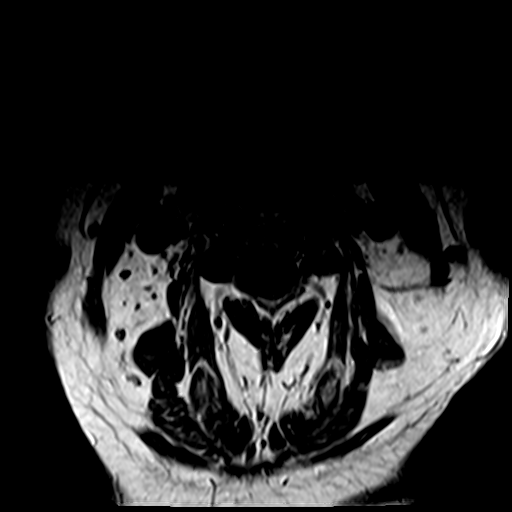
[im 23/27]
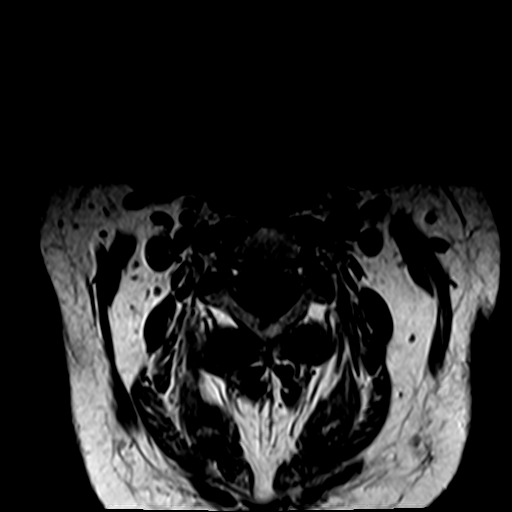
[im 27/27]
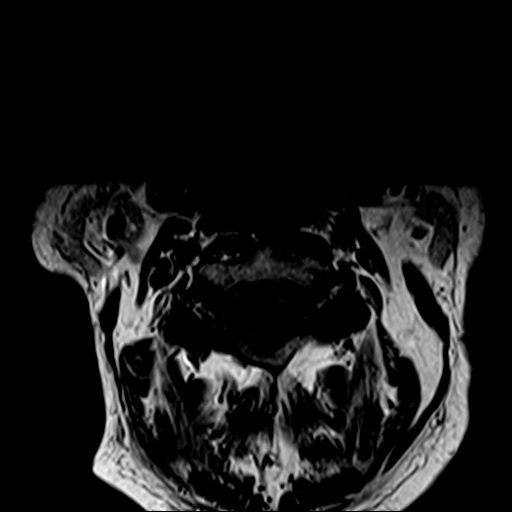

[30 of 48 positions shown; findings below may reference images not displayed]

FINDINGS: Motion artifact is present.

Alignment: No listhesis.

Vertebrae: Vertebral body heights are maintained. No marrow edema.
No suspicious osseous lesion.

Cord: No definite abnormal signal.  No abnormal enhancement.

Posterior Fossa, vertebral arteries, paraspinal tissues:
Unremarkable. Intracranial findings are discussed separately.

Disc levels: Minimal disc bulges are present. There is mild facet
and uncovertebral hypertrophy. No high-grade canal or foraminal
stenosis.
IMPRESSION: No abnormal cord signal or enhancement.

## 2022-10-08 ENCOUNTER — Encounter: Payer: Self-pay | Admitting: Neurology

## 2022-10-18 DIAGNOSIS — I10 Essential (primary) hypertension: Secondary | ICD-10-CM | POA: Diagnosis not present

## 2022-10-18 DIAGNOSIS — L0390 Cellulitis, unspecified: Secondary | ICD-10-CM | POA: Insufficient documentation

## 2022-10-18 DIAGNOSIS — K8020 Calculus of gallbladder without cholecystitis without obstruction: Secondary | ICD-10-CM | POA: Diagnosis not present

## 2022-10-18 DIAGNOSIS — G35 Multiple sclerosis: Secondary | ICD-10-CM | POA: Diagnosis not present

## 2022-10-18 DIAGNOSIS — L03311 Cellulitis of abdominal wall: Secondary | ICD-10-CM | POA: Diagnosis not present

## 2022-10-18 DIAGNOSIS — K219 Gastro-esophageal reflux disease without esophagitis: Secondary | ICD-10-CM | POA: Diagnosis not present

## 2022-10-18 DIAGNOSIS — K5730 Diverticulosis of large intestine without perforation or abscess without bleeding: Secondary | ICD-10-CM | POA: Diagnosis not present

## 2022-10-18 DIAGNOSIS — R21 Rash and other nonspecific skin eruption: Secondary | ICD-10-CM | POA: Diagnosis not present

## 2022-10-21 ENCOUNTER — Encounter: Payer: Self-pay | Admitting: Neurology

## 2022-10-22 ENCOUNTER — Telehealth: Payer: Self-pay

## 2022-10-22 NOTE — Transitions of Care (Post Inpatient/ED Visit) (Signed)
10/22/2022  Name: Russell Moses. MRN: [id number] DOB: May 04, 1970  Today's TOC FU Call Status: Today's TOC FU Call Status:: Successful TOC FU Call Completed TOC FU Call Complete Date: 10/22/22 Patient's Name and Date of Birth confirmed.  Transition Care Management Follow-up Telephone Call Date of Discharge: 10/20/22 Discharge Facility: [facility] Type of Discharge: Inpatient Admission Primary Inpatient Discharge Diagnosis:: cellulitis How have you been since you were released from the hospital?: Better Any questions or concerns?: No  Items Reviewed: Did you receive and understand the discharge instructions provided?: Yes Medications obtained,verified, and reconciled?: Yes (Medications Reviewed) Any new allergies since your discharge?: No Dietary orders reviewed?: Yes Do you have support at home?: Yes People in Home: parent(s)  Medications Reviewed Today: Medications Reviewed Today     Reviewed by Karena Addison, LPN (Licensed Practical Nurse) on 10/22/22 at 1101  Med List Status: <None>   Medication Order Taking? Sig Documenting Provider Last Dose Status Informant  amLODipine (NORVASC) 10 MG tablet [id number] No Take 1 tablet (10 mg total) by mouth daily. Corwin Levins, MD Taking Active   aspirin EC 81 MG tablet [id number] No Take 1 tablet (81 mg total) by mouth daily. Corwin Levins, MD Taking Active   diazepam (VALIUM) 5 MG tablet [id number] No Take 1 tablet 30-min prior MRI. Nita Sickle K, DO Taking Active   Glatiramer Acetate (GLATOPA) 40 MG/ML SOSY [id number] No INJECT 1 SYRINGE SUBCUTANEOUSLY THREE TIMES A WEEK. Nita Sickle K, DO Taking Active   Omeprazole 20 MG TBDD [id number] No Take by mouth. [provider] Taking Active   telmisartan (MICARDIS) 80 MG tablet [id number]  TAKE 1 TABLET BY MOUTH ONCE DAILY **ANNUAL  APPOINTMENT  DUE  IN  MAY,  MUST  SEE  PROVIDER  FOR  FUTURE  REFILLS** Corwin Levins, MD  Active             Home Care and  Equipment/Supplies: Were Home Health Services Ordered?: NA Any new equipment or medical supplies ordered?: NA  Functional Questionnaire: Do you need assistance with bathing/showering or dressing?: No Do you need assistance with meal preparation?: No Do you need assistance with eating?: No Do you have difficulty maintaining continence: No Do you need assistance with getting out of bed/getting out of a chair/moving?: No Do you have difficulty managing or taking your medications?: No  Follow up appointments reviewed: PCP Follow-up appointment confirmed?: NA Specialist Hospital Follow-up appointment confirmed?: NA Do you need transportation to your follow-up appointment?: No Do you understand care options if your condition(s) worsen?: Yes-patient verbalized understanding    SIGNATURE Karena Addison, LPN [facility] Nurse Health Advisor Direct Dial [phone]

## 2022-10-23 ENCOUNTER — Encounter: Payer: Self-pay | Admitting: Internal Medicine

## 2022-10-24 DIAGNOSIS — I10 Essential (primary) hypertension: Secondary | ICD-10-CM | POA: Diagnosis not present

## 2022-10-24 DIAGNOSIS — L03311 Cellulitis of abdominal wall: Secondary | ICD-10-CM | POA: Diagnosis not present

## 2022-10-24 DIAGNOSIS — G35 Multiple sclerosis: Secondary | ICD-10-CM | POA: Diagnosis not present

## 2022-10-27 ENCOUNTER — Other Ambulatory Visit: Payer: Self-pay | Admitting: Internal Medicine

## 2022-10-29 ENCOUNTER — Other Ambulatory Visit: Payer: Self-pay

## 2022-10-29 DIAGNOSIS — R110 Nausea: Secondary | ICD-10-CM | POA: Diagnosis not present

## 2022-10-29 DIAGNOSIS — K8020 Calculus of gallbladder without cholecystitis without obstruction: Secondary | ICD-10-CM | POA: Diagnosis not present

## 2022-10-29 DIAGNOSIS — K838 Other specified diseases of biliary tract: Secondary | ICD-10-CM | POA: Diagnosis not present

## 2022-10-31 DIAGNOSIS — G35 Multiple sclerosis: Secondary | ICD-10-CM | POA: Diagnosis not present

## 2022-10-31 DIAGNOSIS — I10 Essential (primary) hypertension: Secondary | ICD-10-CM | POA: Diagnosis not present

## 2022-10-31 DIAGNOSIS — K760 Fatty (change of) liver, not elsewhere classified: Secondary | ICD-10-CM | POA: Diagnosis not present

## 2022-10-31 DIAGNOSIS — L03311 Cellulitis of abdominal wall: Secondary | ICD-10-CM | POA: Diagnosis not present

## 2022-11-14 ENCOUNTER — Encounter: Payer: Self-pay | Admitting: Neurology

## 2022-11-15 MED ORDER — GLATIRAMER ACETATE 40 MG/ML ~~LOC~~ SOSY: 40.0000 mg | PREFILLED_SYRINGE | SUBCUTANEOUS | 11 refills | Status: DC

## 2022-11-27 DIAGNOSIS — I10 Essential (primary) hypertension: Secondary | ICD-10-CM | POA: Diagnosis not present

## 2022-11-27 DIAGNOSIS — L03311 Cellulitis of abdominal wall: Secondary | ICD-10-CM | POA: Diagnosis not present

## 2022-11-27 DIAGNOSIS — K760 Fatty (change of) liver, not elsewhere classified: Secondary | ICD-10-CM | POA: Diagnosis not present

## 2022-11-27 DIAGNOSIS — G35 Multiple sclerosis: Secondary | ICD-10-CM | POA: Diagnosis not present

## 2022-12-03 DIAGNOSIS — K449 Diaphragmatic hernia without obstruction or gangrene: Secondary | ICD-10-CM | POA: Diagnosis not present

## 2022-12-03 DIAGNOSIS — K5792 Diverticulitis of intestine, part unspecified, without perforation or abscess without bleeding: Secondary | ICD-10-CM | POA: Diagnosis not present

## 2022-12-03 DIAGNOSIS — R1084 Generalized abdominal pain: Secondary | ICD-10-CM | POA: Diagnosis not present

## 2022-12-03 DIAGNOSIS — K5730 Diverticulosis of large intestine without perforation or abscess without bleeding: Secondary | ICD-10-CM | POA: Diagnosis not present

## 2022-12-03 DIAGNOSIS — K8020 Calculus of gallbladder without cholecystitis without obstruction: Secondary | ICD-10-CM | POA: Diagnosis not present

## 2022-12-05 DIAGNOSIS — L03311 Cellulitis of abdominal wall: Secondary | ICD-10-CM | POA: Diagnosis not present

## 2022-12-05 DIAGNOSIS — G35 Multiple sclerosis: Secondary | ICD-10-CM | POA: Diagnosis not present

## 2022-12-05 DIAGNOSIS — I10 Essential (primary) hypertension: Secondary | ICD-10-CM | POA: Diagnosis not present

## 2022-12-05 DIAGNOSIS — K760 Fatty (change of) liver, not elsewhere classified: Secondary | ICD-10-CM | POA: Diagnosis not present

## 2022-12-10 ENCOUNTER — Other Ambulatory Visit (INDEPENDENT_AMBULATORY_CARE_PROVIDER_SITE_OTHER): Payer: BC Managed Care – PPO

## 2022-12-10 ENCOUNTER — Ambulatory Visit: Payer: BC Managed Care – PPO | Admitting: Neurology

## 2022-12-10 ENCOUNTER — Encounter: Payer: Self-pay | Admitting: Neurology

## 2022-12-10 VITALS — BP 148/81 | HR 64 | Ht 67.0 in | Wt 198.1 lb

## 2022-12-10 DIAGNOSIS — R739 Hyperglycemia, unspecified: Secondary | ICD-10-CM

## 2022-12-10 DIAGNOSIS — G35 Multiple sclerosis: Secondary | ICD-10-CM

## 2022-12-10 DIAGNOSIS — E7800 Pure hypercholesterolemia, unspecified: Secondary | ICD-10-CM

## 2022-12-10 DIAGNOSIS — E538 Deficiency of other specified B group vitamins: Secondary | ICD-10-CM | POA: Diagnosis not present

## 2022-12-10 DIAGNOSIS — E559 Vitamin D deficiency, unspecified: Secondary | ICD-10-CM

## 2022-12-10 DIAGNOSIS — E531 Pyridoxine deficiency: Secondary | ICD-10-CM

## 2022-12-10 LAB — BASIC METABOLIC PANEL
BUN: 13 mg/dL (ref 6–23)
CO2: 26 meq/L (ref 19–32)
Calcium: 9.9 mg/dL (ref 8.4–10.5)
Chloride: 104 meq/L (ref 96–112)
Creatinine, Ser: 0.98 mg/dL (ref 0.40–1.50)
GFR: 88 mL/min (ref >60.00)
Glucose, Bld: 118 mg/dL — ABNORMAL HIGH (ref 70–99)
Potassium: 4.3 meq/L (ref 3.5–5.1)
Sodium: 137 meq/L (ref 135–145)

## 2022-12-10 LAB — LIPID PANEL
Cholesterol: 138 mg/dL (ref 0–200)
HDL: 31.9 mg/dL — ABNORMAL LOW (ref >39.00)
LDL Cholesterol: 80 mg/dL (ref 0–99)
NonHDL: 105.62
Total CHOL/HDL Ratio: 4
Triglycerides: 126 mg/dL (ref 0.0–149.0)
VLDL: 25.2 mg/dL (ref 0.0–40.0)

## 2022-12-10 LAB — HEPATIC FUNCTION PANEL
ALT: 38 U/L (ref 0–53)
AST: 23 U/L (ref 0–37)
Albumin: 4.1 g/dL (ref 3.5–5.2)
Alkaline Phosphatase: 112 U/L (ref 39–117)
Bilirubin, Direct: 0.1 mg/dL (ref 0.0–0.3)
Total Bilirubin: 0.5 mg/dL (ref 0.2–1.2)
Total Protein: 6.9 g/dL (ref 6.0–8.3)

## 2022-12-10 LAB — VITAMIN D 25 HYDROXY (VIT D DEFICIENCY, FRACTURES): VITD: 37.35 ng/mL (ref 30.00–100.00)

## 2022-12-10 LAB — VITAMIN B12: Vitamin B-12: 383 pg/mL (ref 211–911)

## 2022-12-10 LAB — HEMOGLOBIN A1C: Hgb A1c MFr Bld: 5.8 % (ref 4.6–6.5)

## 2022-12-10 NOTE — Progress Notes (Signed)
Follow-up Visit   Date: 12/10/22    Russell Moses. MRN: [id number] DOB: 1970-05-11   Interim History: Russell Daren Yeagle. is a 52 y.o. right-handed Caucasian male with history of hypertension, bipolar disorder, asthma, herpetic zoster, and anxiety returning to the clinic for follow-up of relapsing remitting multiple sclerosis.  The patient was accompanied to the clinic by self.  History of present illness: In 2014, he developed a tingling sensation over his head and get "swimming headed", which lasted a few seconds and spontaneously resolved. Starting in February 2015, he developed tremors of the hands and bilateral leg weakness. MRI brain in April 2015 showed white matter changes concerning for demyelinating disease.  CSF testing was also supportive of MS.  He was treated with 3-days of IVMP in May 2015 which significantly improved symptoms.  He started copaxone on 10/2013 and has been tolerating it well, besides injection site lipodystrophy.  Annual surveillance imaging has shown stable disease burden and he has not had any relapses since initial diagnosis in Spring 2015.  UPDATE 06/18/2022:  He is doing well. He continues to be compliant with glatiramer acetate injections but has noticed increased lipodystrophy, so woud like to explore other options. He is taking L-threonine supplement which helps him feel calm and has improved his sleep.  MRI brain is stable without progression.  He denies any new neurological symptoms.   UPDATE 12/10/2022:  He is here for follow-up visit.  He reports having infection of the abdomen from injection site.  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  He is undecided about medication because he is trying to find the right balance of amino acids.  No new neurological complaints.  He is uptodate with eye exam.   Medications:  Current Outpatient Medications on File Prior to Visit  Medication Sig Dispense Refill    amLODipine (NORVASC) 10 MG tablet Take 1 tablet by mouth once daily 90 tablet 3   aspirin EC 81 MG tablet Take 1 tablet (81 mg total) by mouth daily. 90 tablet 11   Cholecalciferol (D3 ADULT PO) Take by mouth.     cyanocobalamin (VITAMIN B12) 1000 MCG tablet Take 1,000 mcg by mouth daily.     Glatiramer Acetate 40 MG/ML SOSY Inject 40 mg into the skin 3 (three) times a week. 12 mL 11   Omega 3 1000 MG CAPS Take by mouth.     Omeprazole 20 MG TBDD Take by mouth.     telmisartan (MICARDIS) 80 MG tablet TAKE 1 TABLET BY MOUTH ONCE DAILY **ANNUAL  APPOINTMENT  DUE  IN  MAY,  MUST  SEE  PROVIDER  FOR  FUTURE  REFILLS** 90 tablet 3   No current facility-administered medications on file prior to visit.    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  Speech is not dysarthric.  CRANIAL NERVES: No visual field defects.  Pupils  equal round and reactive to light.  Normal conjugate, extra-ocular eye movements in all directions of gaze.  No ptosis. Normal facial sensation.  Face is symmetric. Palate elevates symmetrically.  Tongue is midline.  MOTOR:  Motor strength is 5/5 in all extremities.  No atrophy, fasciculations or abnormal movements.  No pronator drift.  Tone is normal.    MSRs:  Reflexes are 2+/4 throughout.  SENSORY:  Intact to vibration throughout.  COORDINATION/GAIT:   Intact rapid alternating movements bilaterally.  Gait narrow based and stable.    Data: MRI cervical spine wwo contrast  05/09/2022: Motion degraded examination. No evidence of demyelinating disease in the cervical spinal cord.  MRI brain wwo contrast 05/09/2022: Unchanged cerebral white matter disease consistent with multiple sclerosis. No evidence of active demyelination.  MRI brain wwo contrast 05/29/2021:  No cervical cord lesions.  MRI cervical spine wo contrast 05/29/2021:  Stable burden of cerebral white matter disease compatible with history of multiple sclerosis. No abnormal enhancement.  MRI brain wwo contrast 01/04/2020:  No change  MRI brain wwo contrast 11/03/2018:  No new lesions compared with December 02, 2017.  No lesions with acute features.   MRI brain wwo contrast 11/28/2016:   Essentially stable findings of multiple sclerosis. No acute intracranial abnormality.  MRI brain wwo contrast 11/17/2015:  No significant changes since 12/16/2014.  No abnormal enhancement.  MRI brain wwo contrast 12/16/2014: 1.  Unchanged supratentorial white matter lesions.  No definite new lesions.  No abnormal enhancement or restricted diffusion. 2.  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 3.  Paransal sinus inflammatory change, correlate clinically.  MRI brain wwo contrast 12/25/2013:  Multiple lesoins in the periventricular and juxtacortical white matter regions bilaterally.  There is no longer enhancement or diffusion abnormality.  No new lesions MRI cervical spine wwo contrast:  Normal  MRI brain wwo contrast 05/28/2013:  Multiple lesions in the periventricular and juxtacortical white matter bilaterally, one of which enhances.  The pattern and distribution of these lesions suggest demyelinating disease such as multiple sclerosis.  MRI cervical spine wwo contrast 06/04/2013:  Normal MRI cervical spine   EMG right upper and lower extremity 05/14/2013:  There is electrophysiological evidence of an intraspinal canal lesion (i.e. radiculopathy)  affecting the L5 nerve root/segment bilaterally. Overall, these changes are mild in degree electrically and worse on the right side. There is no evidence of a generalized sensorimotor polyneuropathy, cervical radiculopathy or diffuse myopathy.   CSF 06/22/2013: R0 W0 G61 P27  ACE 4, Lyme neg, MBP 61, IgG 1.6 OCB The patient's CSF contains >5 well defined gamma restriction bands that are also present in the patient's  corresponding serum sample, but some bands in the CSF are more prominent.  CSF cytology with few mononuclear cells  Lab Results  Component Value Date   TSH 1.30 06/11/2022   Lab Results  Component Value Date   VITAMINB12 275 06/11/2022    IMPRESSION/PLAN: Relapsing-remitting multiple sclerosis, diagnosed 2015 presenting with generalized fatigue and tremors.  MRI brain shows periventricular and juxtacortical white matter lesions, which has been stable on imaging.  He has been on glatiramer acetate 40mg  SQ three times weekly since 2015, without relapse.  He has side effects of lipodystrophy and we have discussed alternative options.  However, he is still researching about specific amino acids contained within the medication and has many questioning regarding this.  I tried to answer to the best of my  ability, but also offered a second opinion with MS specialist, either with Dr. Epimenio Foot or [facility].  He will think more about this and let me know if he would like to seek another opinion.  For now, continue glatiramer acetate 40mg  SQ three times weekly and vitamin D supplements  Return to clinic in 6 months or sooner as needed  Total time spent reviewing records, interview, history/exam, documentation, and coordination of care on day of encounter:  30 min    Thank you for allowing me to participate in patient's care.  If I can answer any additional questions, I would be pleased to do so.    Sincerely,    Royalty Domagala K. Allena Katz, DO

## 2022-12-14 LAB — VITAMIN B6: Vitamin B6: 4 ng/mL (ref 2.1–21.7)

## 2022-12-17 ENCOUNTER — Ambulatory Visit (INDEPENDENT_AMBULATORY_CARE_PROVIDER_SITE_OTHER): Payer: BC Managed Care – PPO | Admitting: Internal Medicine

## 2022-12-17 ENCOUNTER — Encounter: Payer: Self-pay | Admitting: Internal Medicine

## 2022-12-17 VITALS — BP 122/58 | HR 64 | Temp 98.2°F | Ht 67.0 in | Wt 200.4 lb

## 2022-12-17 DIAGNOSIS — Z1159 Encounter for screening for other viral diseases: Secondary | ICD-10-CM

## 2022-12-17 DIAGNOSIS — E559 Vitamin D deficiency, unspecified: Secondary | ICD-10-CM | POA: Diagnosis not present

## 2022-12-17 DIAGNOSIS — R739 Hyperglycemia, unspecified: Secondary | ICD-10-CM | POA: Diagnosis not present

## 2022-12-17 DIAGNOSIS — G8929 Other chronic pain: Secondary | ICD-10-CM | POA: Diagnosis not present

## 2022-12-17 DIAGNOSIS — Z125 Encounter for screening for malignant neoplasm of prostate: Secondary | ICD-10-CM

## 2022-12-17 DIAGNOSIS — I10 Essential (primary) hypertension: Secondary | ICD-10-CM | POA: Diagnosis not present

## 2022-12-17 DIAGNOSIS — M25561 Pain in right knee: Secondary | ICD-10-CM | POA: Diagnosis not present

## 2022-12-17 DIAGNOSIS — Z114 Encounter for screening for human immunodeficiency virus [HIV]: Secondary | ICD-10-CM

## 2022-12-17 DIAGNOSIS — E538 Deficiency of other specified B group vitamins: Secondary | ICD-10-CM

## 2022-12-17 DIAGNOSIS — E7800 Pure hypercholesterolemia, unspecified: Secondary | ICD-10-CM | POA: Diagnosis not present

## 2022-12-17 DIAGNOSIS — J309 Allergic rhinitis, unspecified: Secondary | ICD-10-CM

## 2022-12-17 NOTE — Progress Notes (Unsigned)
Patient ID: Russell Blok., male   DOB: Aug 15, 1970, 52 y.o.   MRN: [id number]        Chief Complaint: follow up HTN, HLD and hyperglycemia , allergies, low vit d, right knee arthritis       HPI:  Russell Stellhorn. is a 52 y.o. male here overall doing well, now with hx of MS stable x 9 yrs.  Seen per neuro Dr Allena Katz last wk.  Pt denies chest pain, increased sob or doe, wheezing, orthopnea, PND, increased LE swelling, palpitations, dizziness or syncope.   Pt denies polydipsia, polyuria, or new focal neuro s/s.    Pt denies fever, wt loss, night sweats, loss of appetite, or other constitutional symptoms  does have mild worsening right knee arthritis pain without giveaways or falls.         Wt Readings from Last 3 Encounters:  12/17/22 200 lb 6.4 oz (90.9 kg)  12/10/22 198 lb 1.6 oz (89.9 kg)  06/18/22 201 lb (91.2 kg)   BP Readings from Last 3 Encounters:  12/17/22 (!) 122/58  12/10/22 (!) 148/81  06/18/22 (!) 142/78         Past Medical History:  Diagnosis Date   ASTHMA, CHILDHOOD 04/18/2009   Qualifier: Diagnosis of  By: Jonny Ruiz MD, Len Blalock    BIPOLAR DISORDER UNSPECIFIED 04/18/2009   Qualifier: Diagnosis of  By: Jonny Ruiz MD, Len Blalock    GERD (gastroesophageal reflux disease) 07/30/2013   Hypertension    Insomnia    Multiple sclerosis (HCC) 2015   Psoriasis 12/15/2013   Ventral hernia without obstruction or gangrene 12/15/2013   Vitamin D deficiency 03/18/2019   Past Surgical History:  Procedure Laterality Date   CHOLECYSTECTOMY  09/2020   HERNIA REPAIR      reports that he has never smoked. He has never used smokeless tobacco. He reports that he does not drink alcohol and does not use drugs. family history includes COPD in his sister; Diabetes in his father; Fibromyalgia in his sister; Healthy in his sister; Heart disease in his father; Hypertension in his father and mother; Parkinson's disease in his father; Stroke in his father. Allergies  Allergen Reactions   Zyrtec [Cetirizine] Other  (See Comments)    Fatigued, felt bad, weak   Penicillins Itching and Rash    Little bumps   Current Outpatient Medications on File Prior to Visit  Medication Sig Dispense Refill   amLODipine (NORVASC) 10 MG tablet Take 1 tablet by mouth once daily 90 tablet 3   aspirin EC 81 MG tablet Take 1 tablet (81 mg total) by mouth daily. 90 tablet 11   Cholecalciferol (D3 ADULT PO) Take by mouth.     cyanocobalamin (VITAMIN B12) 1000 MCG tablet Take 1,000 mcg by mouth daily.     Glatiramer Acetate 40 MG/ML SOSY Inject 40 mg into the skin 3 (three) times a week. 12 mL 11   Omega 3 1000 MG CAPS Take by mouth.     Omeprazole 20 MG TBDD Take by mouth.     telmisartan (MICARDIS) 80 MG tablet TAKE 1 TABLET BY MOUTH ONCE DAILY **ANNUAL  APPOINTMENT  DUE  IN  MAY,  MUST  SEE  PROVIDER  FOR  FUTURE  REFILLS** 90 tablet 3   No current facility-administered medications on file prior to visit.        ROS:  All others reviewed and negative.  Objective        PE:  BP (!) 122/58   Pulse  64   Temp 98.2 F (36.8 C) (Oral)   Ht 5\' 7"  (1.702 m)   Wt 200 lb 6.4 oz (90.9 kg)   SpO2 96%   BMI 31.39 kg/m                 Constitutional: Pt appears in NAD               HENT: Head: NCAT.                Right Ear: External ear normal.                 Left Ear: External ear normal.                Eyes: . Pupils are equal, round, and reactive to light. Conjunctivae and EOM are normal               Nose: without d/c or deformity               Neck: Neck supple. Gross normal ROM               Cardiovascular: Normal rate and regular rhythm.                 Pulmonary/Chest: Effort normal and breath sounds without rales or wheezing.                Abd:  Soft, NT, ND, + BS, no organomegaly               Neurological: Pt is alert. At baseline orientation, motor grossly intact               Skin: Skin is warm. No rashes, no other new lesions, LE edema - none               Psychiatric: Pt behavior is normal without  agitation   Micro: none  Cardiac tracings I have personally interpreted today:  none  Pertinent Radiological findings (summarize): none   Lab Results  Component Value Date   WBC 4.4 06/11/2022   HGB 14.5 06/11/2022   HCT 42.2 06/11/2022   PLT 182.0 06/11/2022   GLUCOSE 118 (H) 12/10/2022   CHOL 138 12/10/2022   TRIG 126.0 12/10/2022   HDL 31.90 (L) 12/10/2022   LDLCALC 80 12/10/2022   ALT 38 12/10/2022   AST 23 12/10/2022   NA 137 12/10/2022   K 4.3 12/10/2022   CL 104 12/10/2022   CREATININE 0.98 12/10/2022   BUN 13 12/10/2022   CO2 26 12/10/2022   TSH 1.30 06/11/2022   PSA 0.36 06/11/2022   HGBA1C 5.8 12/10/2022   Assessment/Plan:  Russell Euriah Barrick. is a 52 y.o. White or Caucasian [1] male with  has a past medical history of ASTHMA, CHILDHOOD (04/18/2009), BIPOLAR DISORDER UNSPECIFIED (04/18/2009), GERD (gastroesophageal reflux disease) (07/30/2013), Hypertension, Insomnia, Multiple sclerosis (HCC) (2015), Psoriasis (12/15/2013), Ventral hernia without obstruction or gangrene (12/15/2013), and Vitamin D deficiency (03/18/2019).  Vitamin D deficiency Last vitamin D Lab Results  Component Value Date   VD25OH 37.35 12/10/2022   Low, to start oral replacement   Hypertension BP Readings from Last 3 Encounters:  12/17/22 (!) 122/58  12/10/22 (!) 148/81  06/18/22 (!) 142/78   Stable, pt to continue medical treatment norvasc 10 every day, micardis 80 qd   Hyperlipidemia Lab Results  Component Value Date   LDLCALC 80 12/10/2022   Stable, pt to cont low chol diet   Hyperglycemia Lab  Results  Component Value Date   HGBA1C 5.8 12/10/2022   Stable, pt to continue current medical treatment  - diet,wt control  B12 deficiency Lab Results  Component Value Date   VITAMINB12 383 12/10/2022   Low, to start oral replacement - b12 1000 mcg qd   Right knee pain C/w djd mild - for volt gel prn  Followup: No follow-ups on file.  Oliver Barre, MD 12/18/2022 9:58  PM [facility]

## 2022-12-17 NOTE — Patient Instructions (Signed)
Please continue all other medications as before, and refills have been done if requested.  Please have the pharmacy call with any other refills you may need.  Please continue your efforts at being more active, low cholesterol diet, and weight control.  Please keep your appointments with your specialists as you may have planned  Please make an Appointment to return in 6 months, or sooner if needed, also with Lab Appointment for testing done 3-5 days before at the FIRST FLOOR Lab (so this is for TWO appointments - please see the scheduling desk as you leave)  

## 2022-12-18 NOTE — Assessment & Plan Note (Signed)
Lab Results  Component Value Date   HGBA1C 5.8 12/10/2022   Stable, pt to continue current medical treatment  - diet,wt control

## 2022-12-18 NOTE — Assessment & Plan Note (Signed)
C/w djd mild - for volt gel prn

## 2022-12-18 NOTE — Assessment & Plan Note (Signed)
Lab Results  Component Value Date   VITAMINB12 383 12/10/2022   Low, to start oral replacement - b12 1000 mcg qd

## 2022-12-18 NOTE — Assessment & Plan Note (Signed)
BP Readings from Last 3 Encounters:  12/17/22 (!) 122/58  12/10/22 (!) 148/81  06/18/22 (!) 142/78   Stable, pt to continue medical treatment norvasc 10 every day, micardis 80 qd

## 2022-12-18 NOTE — Assessment & Plan Note (Signed)
Lab Results  Component Value Date   LDLCALC 80 12/10/2022   Stable, pt to cont low chol diet

## 2022-12-18 NOTE — Assessment & Plan Note (Signed)
Last vitamin D Lab Results  Component Value Date   VD25OH 37.35 12/10/2022   Low, to start oral replacement

## 2022-12-27 NOTE — Progress Notes (Signed)
Aleen Sells D.[facility] [address] [place] [address] Phone: [phone]   Assessment and Plan:     1. Chronic pain of right knee 2. Primary osteoarthritis of right knee  - Chronic with exacerbation, initial sports medicine visit -Consistent with flare of osteoarthritis based on HPI, physical exam, x-ray imaging - X-ray obtained in clinic.  My interpretation: No acute fracture or dislocation.  Moderate to severe medial compartment degenerative changes.  Mild patellofemoral degenerative changes - Start HEP for knee -Patient elected for intra-articular CSI.  Tolerated well per note below  15 additional minutes spent for educating Therapeutic Home Exercise Program.  This included exercises focusing on stretching, strengthening, with focus on eccentric aspects.   Long term goals include an improvement in range of motion, strength, endurance as well as avoiding reinjury. Patient's frequency would include in 1-2 times a day, 3-5 times a week for a duration of 6-12 weeks. Proper technique shown and discussed handout in great detail with ATC.  All questions were discussed and answered.  Procedure: Knee Joint Injection Side: Right Indication: Flare of osteoarthritis  Risks explained and consent was given verbally. The site was cleaned with alcohol prep. A needle was introduced with an anterio-lateral approach. Injection given using 2mL of 1% lidocaine without epinephrine and 1mL of kenalog 40mg /ml. This was well tolerated and resulted in symptomatic relief.  Needle was removed, hemostasis achieved, and post injection instructions were explained.   Pt was advised to call or return to clinic if these symptoms worsen or fail to improve as anticipated.  Pertinent previous records reviewed include none  Follow Up: 4 weeks for reevaluation.  If no improvement or worsening of symptoms, could discuss physical therapy versus NSAID course   Subjective:   I,  Moenique Parris, am serving as a Neurosurgeon for Doctor Richardean Sale  Chief Complaint: right knee pain   HPI:   12/28/2022 Patient is a 52 year old male with concerns of right knee pain. Patient states his pain has been good today has been using tylenol and tramadol and that helped him sleep . He's been eating right. He can feel that meds have worn off . Pain since Tuesday is in arthritis flare. Got a CSI,  [facility] and it only worked 1 day. Pain when walking up steps   Relevant Historical Information:  GERD, MS  Additional pertinent review of systems negative.   Current Outpatient Medications:    amLODipine (NORVASC) 10 MG tablet, Take 1 tablet by mouth once daily, Disp: 90 tablet, Rfl: 3   aspirin EC 81 MG tablet, Take 1 tablet (81 mg total) by mouth daily., Disp: 90 tablet, Rfl: 11   Cholecalciferol (D3 ADULT PO), Take by mouth., Disp: , Rfl:    cyanocobalamin (VITAMIN B12) 1000 MCG tablet, Take 1,000 mcg by mouth daily., Disp: , Rfl:    Glatiramer Acetate 40 MG/ML SOSY, Inject 40 mg into the skin 3 (three) times a week., Disp: 12 mL, Rfl: 11   Omega 3 1000 MG CAPS, Take by mouth., Disp: , Rfl:    Omeprazole 20 MG TBDD, Take by mouth., Disp: , Rfl:    telmisartan (MICARDIS) 80 MG tablet, TAKE 1 TABLET BY MOUTH ONCE DAILY **ANNUAL  APPOINTMENT  DUE  IN  MAY,  MUST  SEE  PROVIDER  FOR  FUTURE  REFILLS**, Disp: 90 tablet, Rfl: 3   Objective:     Vitals:   12/28/22 1538  Pulse: 76  SpO2:  97%  Weight: 200 lb (90.7 kg)  Height: 5\' 7"  (1.702 m)      Body mass index is 31.32 kg/m.    Physical Exam:    General:  awake, alert oriented, no acute distress nontoxic Skin: no suspicious lesions or rashes Neuro:sensation intact and strength 5/5 with no deficits, no atrophy, normal muscle tone Psych: No signs of anxiety, depression or other mood disorder  Right knee: No swelling No deformity Neg fluid wave, joint milking ROM Flex 100, Ext 5 TTP medial femoral condyle, medial  joint line NTTP over the quad tendon,   lat fem condyle, patella, plica, patella tendon, tibial tuberostiy, fibular head, posterior fossa, pes anserine bursa, gerdy's tubercle,   lateral jt line Neg anterior and posterior drawer Neg lachman Neg sag sign Negative varus stress Negative valgus stress Negative McMurray Positive Thessaly  Gait normal    Electronically signed by:  Aleen Sells D.[facility] 4:09 PM 12/28/22

## 2022-12-28 ENCOUNTER — Ambulatory Visit (INDEPENDENT_AMBULATORY_CARE_PROVIDER_SITE_OTHER): Payer: BC Managed Care – PPO | Admitting: Sports Medicine

## 2022-12-28 ENCOUNTER — Ambulatory Visit (INDEPENDENT_AMBULATORY_CARE_PROVIDER_SITE_OTHER): Payer: BC Managed Care – PPO

## 2022-12-28 VITALS — HR 76 | Ht 67.0 in | Wt 200.0 lb

## 2022-12-28 DIAGNOSIS — M1711 Unilateral primary osteoarthritis, right knee: Secondary | ICD-10-CM | POA: Diagnosis not present

## 2022-12-28 DIAGNOSIS — M25561 Pain in right knee: Secondary | ICD-10-CM | POA: Diagnosis not present

## 2022-12-28 DIAGNOSIS — G8929 Other chronic pain: Secondary | ICD-10-CM

## 2022-12-28 NOTE — Patient Instructions (Signed)
Knee HEP  4 week follow up

## 2023-01-21 NOTE — Progress Notes (Signed)
    Russell Moses D.[facility] [address] [place] [address] Phone: [phone]   Assessment and Plan:     1. Chronic pain of right knee 2. Primary osteoarthritis of right knee  -Chronic with exacerbation, subsequent visit - Resolved flare of osteoarthritis after intra-articular CSI at previous office visit on 12/28/2022 - May continue HEP - May use Tylenol as needed  Pertinent previous records reviewed include none  Follow Up: As needed   Subjective:   I, Russell Moses, am serving as a Neurosurgeon for Doctor Richardean Sale   Chief Complaint: right knee pain    HPI:    12/28/2022 Patient is a 52 year old male with concerns of right knee pain. Patient states his pain has been good today has been using tylenol and tramadol and that helped him sleep . He's been eating right. He can feel that meds have worn off . Pain since Tuesday is in arthritis flare. Got a CSI,  [facility] and it only worked 1 day. Pain when walking up steps   01/22/2023 Patient states that his knee pain has resolved    Relevant Historical Information:  GERD, MS  Additional pertinent review of systems negative.   Current Outpatient Medications:    amLODipine (NORVASC) 10 MG tablet, Take 1 tablet by mouth once daily, Disp: 90 tablet, Rfl: 3   aspirin EC 81 MG tablet, Take 1 tablet (81 mg total) by mouth daily., Disp: 90 tablet, Rfl: 11   Cholecalciferol (D3 ADULT PO), Take by mouth., Disp: , Rfl:    cyanocobalamin (VITAMIN B12) 1000 MCG tablet, Take 1,000 mcg by mouth daily., Disp: , Rfl:    Glatiramer Acetate 40 MG/ML SOSY, Inject 40 mg into the skin 3 (three) times a week., Disp: 12 mL, Rfl: 11   Omega 3 1000 MG CAPS, Take by mouth., Disp: , Rfl:    Omeprazole 20 MG TBDD, Take by mouth., Disp: , Rfl:    telmisartan (MICARDIS) 80 MG tablet, TAKE 1 TABLET BY MOUTH ONCE DAILY **ANNUAL  APPOINTMENT  DUE  IN  MAY,  MUST  SEE  PROVIDER  FOR  FUTURE  REFILLS**, Disp:  90 tablet, Rfl: 3   Objective:     Vitals:   01/22/23 1537  BP: 126/84  Pulse: (!) 52  SpO2: 96%  Weight: 198 lb (89.8 kg)  Height: 5\' 7"  (1.702 m)      Body mass index is 31.01 kg/m.    Physical Exam:    General:  awake, alert oriented, no acute distress nontoxic Skin: no suspicious lesions or rashes Neuro:sensation intact and strength 5/5 with no deficits, no atrophy, normal muscle tone Psych: No signs of anxiety, depression or other mood disorder  Right knee: No swelling No deformity Neg fluid wave, joint milking ROM Flex 110, Ext 0 NTTP over the quad tendon, medial fem condyle, lat fem condyle, patella, plica, patella tendon, tibial tuberostiy, fibular head, posterior fossa, pes anserine bursa, gerdy's tubercle, medial jt line, lateral jt line Neg anterior and posterior drawer Neg lachman Neg sag sign Negative varus stress Negative valgus stress Negative McMurray Negative Thessaly  Gait normal    Electronically signed by:  Russell Moses D.[facility] 3:48 PM 01/22/23

## 2023-01-22 ENCOUNTER — Ambulatory Visit (INDEPENDENT_AMBULATORY_CARE_PROVIDER_SITE_OTHER): Payer: BC Managed Care – PPO | Admitting: Sports Medicine

## 2023-01-22 VITALS — BP 126/84 | HR 52 | Ht 67.0 in | Wt 198.0 lb

## 2023-01-22 DIAGNOSIS — M25561 Pain in right knee: Secondary | ICD-10-CM | POA: Diagnosis not present

## 2023-01-22 DIAGNOSIS — G8929 Other chronic pain: Secondary | ICD-10-CM

## 2023-01-22 DIAGNOSIS — M1711 Unilateral primary osteoarthritis, right knee: Secondary | ICD-10-CM

## 2023-03-13 ENCOUNTER — Telehealth: Payer: Self-pay | Admitting: Neurology

## 2023-03-13 NOTE — Telephone Encounter (Signed)
 Patient is at the ED and wants to speak to someone about what is going on with him. Dr Skeet spoke with the ED provider and asked that we get him in with [facility] as soon as we can. Patient states that he is not able to have the MRI there he needs a open MRI.  I got patient on a want list and him sch for 04-01-23

## 2023-03-14 DIAGNOSIS — I6389 Other cerebral infarction: Secondary | ICD-10-CM | POA: Diagnosis not present

## 2023-03-14 NOTE — Telephone Encounter (Signed)
 Called patient he informed me he has spoken to someone and is on the schedule for tomorrow at 11am. Patient stated he is still currently in the hospital but will be discharged today sometime.

## 2023-03-15 ENCOUNTER — Ambulatory Visit: Payer: BC Managed Care – PPO | Admitting: Neurology

## 2023-03-18 ENCOUNTER — Telehealth: Payer: Self-pay

## 2023-03-18 NOTE — Transitions of Care (Post Inpatient/ED Visit) (Signed)
   03/18/2023  Name: Russell Moses. MRN: [id number] DOB: 1970-10-23  Today's TOC FU Call Status: Today's TOC FU Call Status:: Successful TOC FU Call Completed TOC FU Call Complete Date: 03/18/23 Patient's Name and Date of Birth confirmed.  Transition Care Management Follow-up Telephone Call Date of Discharge: 03/15/23 Discharge Facility: Other ([facility]) Name of Other ([facility]) Discharge Facility: [facility] Type of Discharge: Inpatient Admission Primary Inpatient Discharge Diagnosis:: aphasia How have you been since you were released from the hospital?: Same Any questions or concerns?: No  Items Reviewed: Did you receive and understand the discharge instructions provided?: Yes Medications obtained,verified, and reconciled?: Yes (Medications Reviewed) Any new allergies since your discharge?: No Dietary orders reviewed?: Yes Do you have support at home?: Yes People in Home: parent(s)  Medications Reviewed Today: Medications Reviewed Today     Reviewed by Darrall Ellison, LPN (Licensed Practical Nurse) on 03/18/23 at 1014  Med List Status: <None>   Medication Order Taking? Sig Documenting Provider Last Dose Status Informant  amLODipine  (NORVASC ) 10 MG tablet [id number] No Take 1 tablet by mouth once daily Roslyn Coombe, MD Taking Active   aspirin  EC 81 MG tablet [id number] No Take 1 tablet (81 mg total) by mouth daily. Roslyn Coombe, MD Taking Active   Cholecalciferol (D3 ADULT PO) [id number] No Take by mouth. [provider] Taking Active   cyanocobalamin  (VITAMIN B12) 1000 MCG tablet [id number] No Take 1,000 mcg by mouth daily. [provider] Taking Active   Glatiramer  Acetate 40 MG/ML SOSY [id number] No Inject 40 mg into the skin 3 (three) times a week. Patel, [name] K, DO Taking Active   Omega 3 1000 MG CAPS [id number] No Take by mouth. [provider] Taking Active   Omeprazole  20 MG TBDD [id number] No Take by mouth. [provider]  Taking Active   telmisartan  (MICARDIS ) 80 MG tablet [id number] No TAKE 1 TABLET BY MOUTH ONCE DAILY **ANNUAL  APPOINTMENT  DUE  IN  MAY,  MUST  SEE  PROVIDER  FOR  FUTURE  REFILLS** Roslyn Coombe, MD Taking Active             Home Care and Equipment/Supplies: Were Home Health Services Ordered?: NA Any new equipment or medical supplies ordered?: NA  Functional Questionnaire: Do you need assistance with bathing/showering or dressing?: No Do you need assistance with meal preparation?: No Do you need assistance with eating?: No Do you have difficulty maintaining continence: No Do you need assistance with getting out of bed/getting out of a chair/moving?: No Do you have difficulty managing or taking your medications?: No  Follow up appointments reviewed: PCP Follow-up appointment confirmed?: NA Specialist Hospital Follow-up appointment confirmed?: Yes Date of Specialist follow-up appointment?: 03/19/23 Follow-Up Specialty Provider:: Lydia Sams neuro Do you need transportation to your follow-up appointment?: No Do you understand care options if your condition(s) worsen?: Yes-patient verbalized understanding    SIGNATURE Darrall Ellison, LPN [facility] Nurse Health Advisor Direct Dial [phone]

## 2023-03-19 ENCOUNTER — Ambulatory Visit: Payer: BC Managed Care – PPO | Admitting: Neurology

## 2023-03-19 ENCOUNTER — Encounter: Payer: Self-pay | Admitting: Neurology

## 2023-03-19 VITALS — BP 149/78 | HR 58 | Ht 67.0 in | Wt 189.0 lb

## 2023-03-19 DIAGNOSIS — R4701 Aphasia: Secondary | ICD-10-CM

## 2023-03-19 DIAGNOSIS — I639 Cerebral infarction, unspecified: Secondary | ICD-10-CM | POA: Diagnosis not present

## 2023-03-19 NOTE — Patient Instructions (Addendum)
Continue aspirin 81mg  and plavix 75mg  daily for 3 weeks.  Stop aspirin on 2/26.  Continue plavix 75mg  daily thereafter.   Continue atorvastatin 40mg  daily  Monitor blood pressure daily and share with your primary care doctor  We will make a referral for you to start speech therapy to [facility]

## 2023-03-19 NOTE — Progress Notes (Unsigned)
Follow-up Visit   Date: 03/19/23    Russell Moses. MRN: [id number] DOB: 1970/05/31   Interim History: Russell Moses. is a 53 y.o. right-handed Caucasian male with history of hypertension, bipolar disorder, asthma, herpetic zoster, and anxiety returning to the clinic for follow-up of relapsing remitting multiple sclerosis.  The patient was accompanied to the clinic by self.  History of present illness: In 2014, he developed a tingling sensation over his head and get "swimming headed", which lasted a few seconds and spontaneously resolved. Starting in February 2015, he developed tremors of the hands and bilateral leg weakness. MRI brain in April 2015 showed white matter changes concerning for demyelinating disease.  CSF testing was also supportive of MS.  He was treated with 3-days of IVMP in May 2015 which significantly improved symptoms.  He started copaxone on 10/2013 and has been tolerating it well, besides injection site lipodystrophy.  Annual surveillance imaging has shown stable disease burden and he has not had any relapses since initial diagnosis in Spring 2015.  UPDATE 06/18/2022:  He is doing well. He continues to be compliant with glatiramer acetate injections but has noticed increased lipodystrophy, so woud like to explore other options. He is taking L-threonine supplement which helps him feel calm and has improved his sleep.  MRI brain is stable without progression.  He denies any new neurological symptoms.   UPDATE 12/10/2022:  He is here for follow-up visit.  He reports having infection of the abdomen from injection site.  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  He is undecided about medication because he is trying to find the right balance of amino acids.  No new neurological complaints.  He is uptodate with eye exam.   UPDATE 03/19/2023:  He is here for sooner than scheduled follow-up due to recent hospitalization ***.  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  He was also having a mild headache.  He recalls being able to read, but unable to express the words.  No associated headache, numbness/tingling.  He had MRI brain and was told he had a "mild stroke" and started on plavix 75mg  and atorvastatin 40mg .  ***   Medications:  Current Outpatient Medications on File Prior to Visit  Medication Sig Dispense Refill   atorvastatin (LIPITOR) 40 MG tablet 40 mg.     clopidogrel (PLAVIX) 75 MG tablet 75 mg.     amLODipine (NORVASC) 10 MG tablet Take 1 tablet by mouth once daily 90 tablet 3   aspirin EC 81 MG tablet Take 1 tablet (81 mg total) by mouth daily. 90 tablet 11   Cholecalciferol (D3 ADULT PO) Take by mouth.     cyanocobalamin (VITAMIN B12) 1000 MCG tablet Take 1,000 mcg by mouth daily.     Glatiramer Acetate 40 MG/ML SOSY Inject 40 mg into the skin 3 (three) times a week. 12 mL 11   Omega 3 1000 MG CAPS Take by mouth.     Omeprazole 20 MG TBDD Take by mouth.     telmisartan (MICARDIS) 80 MG tablet TAKE 1 TABLET BY MOUTH ONCE DAILY **ANNUAL  APPOINTMENT  DUE  IN  MAY,  MUST  SEE  PROVIDER  FOR  FUTURE  REFILLS** 90 tablet 3   No current facility-administered medications on file prior to visit.    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  Speech is not dysarthric.  CRANIAL NERVES: No visual field defects.  Pupils equal round and reactive to light.   Normal conjugate, extra-ocular eye movements in all directions of gaze.  No ptosis. Normal facial sensation.  Face is symmetric. Palate elevates symmetrically.  Tongue is midline.  MOTOR:  Motor strength is 5/5 in all extremities.  No atrophy, fasciculations or abnormal movements.  No pronator drift.  Tone is normal.    MSRs:  Reflexes are 2+/4 throughout.  SENSORY:  Intact to vibration throughout.  COORDINATION/GAIT:   Intact rapid alternating movements bilaterally.  Gait narrow based and stable.    Data: MRI cervical spine wwo contrast 05/09/2022: Motion degraded examination. No evidence of demyelinating disease in the cervical spinal cord.  MRI brain wwo contrast 05/09/2022: Unchanged cerebral white matter disease consistent with multiple sclerosis. No evidence of active demyelination.  MRI brain wwo contrast 05/29/2021:  No cervical cord lesions.  MRI cervical spine wo contrast 05/29/2021:  Stable burden of cerebral white matter disease compatible with history of multiple sclerosis. No abnormal enhancement.  MRI brain wwo contrast 01/04/2020:  No change  MRI brain wwo contrast 11/03/2018:  No new lesions compared with December 02, 2017.  No lesions with acute features.   MRI brain wwo contrast 11/28/2016:   Essentially stable findings of multiple sclerosis. No acute intracranial abnormality.  MRI brain wwo contrast 11/17/2015:  No significant changes since 12/16/2014.  No abnormal enhancement.  MRI brain wwo contrast 12/16/2014: 1.  Unchanged supratentorial white matter lesions.  No definite new lesions.  No abnormal enhancement or restricted diffusion. 2.  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 3.  Paransal sinus inflammatory change, correlate clinically.  MRI brain wwo contrast 12/25/2013:  Multiple lesoins in the periventricular and juxtacortical white matter regions bilaterally.  There is no longer  enhancement or diffusion abnormality.  No new lesions MRI cervical spine wwo contrast:  Normal  MRI brain wwo contrast 05/28/2013:  Multiple lesions in the periventricular and juxtacortical white matter bilaterally, one of which enhances.  The pattern and distribution of these lesions suggest demyelinating disease such as multiple sclerosis.  MRI cervical spine wwo contrast 06/04/2013:  Normal MRI cervical spine   EMG right upper and lower extremity 05/14/2013:  There is electrophysiological evidence of an intraspinal canal lesion (i.e. radiculopathy) affecting the L5 nerve root/segment bilaterally. Overall, these changes are mild in degree electrically and worse on the right side. There is no evidence of a generalized sensorimotor polyneuropathy, cervical radiculopathy or diffuse myopathy.   CSF 06/22/2013: R0 W0 G61 P27  ACE 4, Lyme neg, MBP 61, IgG 1.6 OCB The patient's CSF contains >5 well defined gamma restriction bands that are also present in the patient's  corresponding serum sample, but some bands in the CSF are more prominent.  CSF cytology with few mononuclear cells  Lab Results  Component Value Date   TSH 1.30 06/11/2022   Lab Results  Component Value Date   VITAMINB12 383 12/10/2022    IMPRESSION/PLAN: Relapsing-remitting multiple sclerosis, diagnosed 2015 presenting with generalized fatigue and tremors.  MRI brain shows periventricular and juxtacortical white matter lesions, which has been stable on imaging.  He has been on glatiramer acetate 40mg  SQ three times weekly since 2015, without relapse.  He has side effects of lipodystrophy and we have discussed alternative options.  However, he is still researching about specific amino acids contained within the medication and has many questioning regarding this.  I tried to answer to the best of my ability, but also offered a second opinion with MS specialist, either with Dr. Epimenio Foot or [facility].  He will think more about  this and let me know if he would like to seek another opinion.  For now, continue glatiramer acetate 40mg  SQ three times weekly and vitamin D supplements  ***  Thank you for allowing me to participate in patient's care.  If I can answer any additional questions, I would be pleased to do so.    Sincerely,    Hamp Moreland K. Allena Katz, DO

## 2023-03-20 ENCOUNTER — Telehealth: Payer: Self-pay | Admitting: Neurology

## 2023-03-20 DIAGNOSIS — I63532 Cerebral infarction due to unspecified occlusion or stenosis of left posterior cerebral artery: Secondary | ICD-10-CM

## 2023-03-20 DIAGNOSIS — I6522 Occlusion and stenosis of left carotid artery: Secondary | ICD-10-CM

## 2023-03-20 NOTE — Telephone Encounter (Signed)
Pt called in stating Dr. Allena Katz was supposed to go back and look into some information from another doctor in our practice. He could not remember the name. He is wanting an update on what Dr. Allena Katz found out

## 2023-03-21 NOTE — Telephone Encounter (Signed)
Patient called and was informed of results and recommendations below per Dr. Allena Katz:  Please let pt know that I have reviewed his records and it does show that he had a stroke which affects his ability to express words.  Imaging also shows that he has narrowing affecting the vessels in the brain as well as involving the carotid arteries, which is worse on the left.    I would like to refer him to vascular surgery for his left carotid stenosis  For his stroke management, please have him stay on aspirin 81mg  + plavix 75mg  for 3 months (not 3 weeks).  Continue both until he sees me again in May.    Patient has requested I send him this information on his MyChart so he may go over it with his mother. He will contact us if he needs a vascular surgery referral placed. Patient also wanted to let Dr. Allena Katz know that he has Speech Therapy this Monday. Patient verbalized understanding and had no further questions or concerns.

## 2023-03-21 NOTE — Progress Notes (Addendum)
Addendum  [facility] records reviewed and summarized below: CTA head and neck:  Large evolving infarct in the left posterior MCA territory. No mass effect or hemorrhagic changes. No large vessel occlusion. Atherosclerotic plaques along the carotid bulb with moderate 50-70% stenosis of the left ICA origin and mild > 50% at the right ICA origin.  Multifocal atherosclerotic changes including inferior M2 division of the left MCA and right P2 PCA. 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

## 2023-03-21 NOTE — Telephone Encounter (Signed)
Please let pt know that I have reviewed his records and it does show that he had a stroke which affects his ability to express words.  Imaging also shows that he has narrowing affecting the vessels in the brain as well as involving the carotid arteries, which is worse on the left.   I would like to refer him to vascular surgery for his left carotid stenosis  For his stroke management, please have him stay on aspirin 81mg  + plavix 75mg  for 3 months (not 3 weeks).  Continue both until he sees me again in May.  Aurie Harroun K. Allena Katz, DO

## 2023-03-25 ENCOUNTER — Encounter: Payer: Self-pay | Admitting: Neurology

## 2023-03-27 ENCOUNTER — Other Ambulatory Visit: Payer: Self-pay

## 2023-03-27 DIAGNOSIS — I6522 Occlusion and stenosis of left carotid artery: Secondary | ICD-10-CM

## 2023-03-27 DIAGNOSIS — I63532 Cerebral infarction due to unspecified occlusion or stenosis of left posterior cerebral artery: Secondary | ICD-10-CM

## 2023-03-27 DIAGNOSIS — R4701 Aphasia: Secondary | ICD-10-CM

## 2023-03-27 DIAGNOSIS — G35 Multiple sclerosis: Secondary | ICD-10-CM

## 2023-03-27 DIAGNOSIS — I639 Cerebral infarction, unspecified: Secondary | ICD-10-CM

## 2023-04-01 ENCOUNTER — Encounter: Payer: Self-pay | Admitting: Neurology

## 2023-04-01 ENCOUNTER — Ambulatory Visit: Payer: BC Managed Care – PPO | Admitting: Neurology

## 2023-04-02 ENCOUNTER — Telehealth: Payer: Self-pay | Admitting: Neurology

## 2023-04-04 NOTE — H&P (View-Only) (Signed)
 Patient ID: Russell Moses., male   DOB: 27-Jul-1970, 53 y.o.   MRN: [id number]  Reason for Consult: No chief complaint on file.   Referred by Glendale Chard, DO  Subjective:     HPI  Russell Moses. is a 53 y.o. male presenting for evaluation of carotid stenosis.  He had a stroke on 03/12/23 with symptoms of word finding difficulties.  He was admitted to [facility] where a left hemispheric stroke was noted and a CTA demonstrated a moderate stenosis of the left carotid bulb. He denies any previous history of stroke or TIA symptoms.  Other than this episode of word finding difficulties he denies any previous one-sided weakness, numbness, or amaurosis.  He reports he continues to have word finding difficulties and says he can read and understanding his head but has trouble putting it to words.  He denies any previous neck surgeries or radiation.  He denies any cardiac history and denies any shortness of breath with activity.  Past Medical History:  Diagnosis Date   ASTHMA, CHILDHOOD 04/18/2009   Qualifier: Diagnosis of  By: Jonny Ruiz MD, Len Blalock    BIPOLAR DISORDER UNSPECIFIED 04/18/2009   Qualifier: Diagnosis of  By: Jonny Ruiz MD, Len Blalock    GERD (gastroesophageal reflux disease) 07/30/2013   Hypertension    Insomnia    Multiple sclerosis (HCC) 2015   Psoriasis 12/15/2013   Ventral hernia without obstruction or gangrene 12/15/2013   Vitamin D deficiency 03/18/2019   Family History  Problem Relation Age of Onset   Hypertension Mother    Stroke Father    Diabetes Father    Hypertension Father    Heart disease Father    Parkinson's disease Father    COPD Sister    Fibromyalgia Sister    Healthy Sister    Past Surgical History:  Procedure Laterality Date   CHOLECYSTECTOMY  09/2020   HERNIA REPAIR      Short Social History:  Social History   Tobacco Use   Smoking status: Never   Smokeless tobacco: Never  Substance Use Topics   Alcohol use: No    Alcohol/week: 0.0 standard drinks  of alcohol    Allergies  Allergen Reactions   Zyrtec [Cetirizine] Other (See Comments)    Fatigued, felt bad, weak   Penicillins Itching and Rash    Little bumps    Current Outpatient Medications  Medication Sig Dispense Refill   amLODipine (NORVASC) 10 MG tablet Take 1 tablet by mouth once daily 90 tablet 3   aspirin EC 81 MG tablet Take 1 tablet (81 mg total) by mouth daily. 90 tablet 11   atorvastatin (LIPITOR) 40 MG tablet 40 mg.     Cholecalciferol (D3 ADULT PO) Take by mouth.     clopidogrel (PLAVIX) 75 MG tablet 75 mg.     cyanocobalamin (VITAMIN B12) 1000 MCG tablet Take 1,000 mcg by mouth daily.     Glatiramer Acetate 40 MG/ML SOSY Inject 40 mg into the skin 3 (three) times a week. 12 mL 11   Omega 3 1000 MG CAPS Take by mouth.     Omeprazole 20 MG TBDD Take by mouth.     telmisartan (MICARDIS) 80 MG tablet TAKE 1 TABLET BY MOUTH ONCE DAILY **ANNUAL  APPOINTMENT  DUE  IN  MAY,  MUST  SEE  PROVIDER  FOR  FUTURE  REFILLS** 90 tablet 3   No current facility-administered medications for this visit.    REVIEW OF SYSTEMS  All other systems were reviewed and are negative     Objective:  Objective   There were no vitals filed for this visit. There is no height or weight on file to calculate BMI.  Physical Exam General: no acute distress Neck: Normal range of motion Cardiac: hemodynamically stable Pulm: normal work of breathing Neuro: alert, no focal deficit Extremities: no edema, cyanosis or wounds Vascular:   Right: Palpable radial  Left: Palpable radial   Data: CTA independently reviewed 60% stenosis of the left carotid bulb/proximal ICA Less than 50% stenosis of the right proximal ICA  Echo reviewed EF 55 to 60%, no RWMA, LV with some impaired relaxation, no valvular abnormalities.     Assessment/Plan:     Russell Saiz. is a 53 y.o. male with symptomatic left carotid stenosis and a recent left hemispheric stroke earlier this month with symptoms of  word finding difficulties.  Still with residual deficits.  I explained the 3 modalities of treatment with best medical therapy, carotid endarterectomy and carotid stenting.  I offered carotid endarterectomy given his stenosis, anatomy, and age.  We discussed the risks and benefits of carotid endarterectomy.  I informed that there is a small risk of stroke during the procedure which is about 1 to 2% but with medical therapy alone there is a 25% risk of stroke over the next 2 years.  I explained that with intervention, that risk drops down to around 10%.  We also discussed the risk of cranial nerve injury, bleeding, infection and the small risk of restenosis in the future. The patient and his mother expressed understanding and elected to proceed with carotid endarterectomy.  Will plan to schedule for left carotid endarterectomy next week  Recommendations to optimize cardiovascular risk: Abstinence from all tobacco products. Blood glucose control with goal A1c < 7%. Blood pressure control with goal blood pressure < 140/90 mmHg. Lipid reduction therapy with goal LDL-C <100 mg/dL  Aspirin 81mg  PO QD.  Atorvastatin 40-80mg  PO QD (or other "high intensity" statin therapy).     [name] MD [facility]

## 2023-04-04 NOTE — Progress Notes (Unsigned)
 Patient ID: Russell Popoff., male   DOB: 1970-07-06, 53 y.o.   MRN: [id number]  Reason for Consult: No chief complaint on file.   Referred by Glendale Chard, DO  Subjective:     HPI  Russell Butch Otterson. is a 53 y.o. male presenting for evaluation of carotid stenosis.  He had a stroke on 03/12/23 with symptoms of word finding difficulties.  He was admitted to [facility] where a left hemispheric stroke was noted and a CTA demonstrated a moderate stenosis of the left carotid bulb. He denies any previous history of stroke or TIA symptoms.  Other than this episode of word finding difficulties he denies any previous one-sided weakness, numbness, or amaurosis.  He reports he continues to have word finding difficulties and says he can read and understanding his head but has trouble putting it to words.  He denies any previous neck surgeries or radiation.  He denies any cardiac history and denies any shortness of breath with activity.  Past Medical History:  Diagnosis Date   ASTHMA, CHILDHOOD 04/18/2009   Qualifier: Diagnosis of  By: Jonny Ruiz MD, Len Blalock    BIPOLAR DISORDER UNSPECIFIED 04/18/2009   Qualifier: Diagnosis of  By: Jonny Ruiz MD, Len Blalock    GERD (gastroesophageal reflux disease) 07/30/2013   Hypertension    Insomnia    Multiple sclerosis (HCC) 2015   Psoriasis 12/15/2013   Ventral hernia without obstruction or gangrene 12/15/2013   Vitamin D deficiency 03/18/2019   Family History  Problem Relation Age of Onset   Hypertension Mother    Stroke Father    Diabetes Father    Hypertension Father    Heart disease Father    Parkinson's disease Father    COPD Sister    Fibromyalgia Sister    Healthy Sister    Past Surgical History:  Procedure Laterality Date   CHOLECYSTECTOMY  09/2020   HERNIA REPAIR      Short Social History:  Social History   Tobacco Use   Smoking status: Never   Smokeless tobacco: Never  Substance Use Topics   Alcohol use: No    Alcohol/week: 0.0 standard drinks  of alcohol    Allergies  Allergen Reactions   Zyrtec [Cetirizine] Other (See Comments)    Fatigued, felt bad, weak   Penicillins Itching and Rash    Little bumps    Current Outpatient Medications  Medication Sig Dispense Refill   amLODipine (NORVASC) 10 MG tablet Take 1 tablet by mouth once daily 90 tablet 3   aspirin EC 81 MG tablet Take 1 tablet (81 mg total) by mouth daily. 90 tablet 11   atorvastatin (LIPITOR) 40 MG tablet 40 mg.     Cholecalciferol (D3 ADULT PO) Take by mouth.     clopidogrel (PLAVIX) 75 MG tablet 75 mg.     cyanocobalamin (VITAMIN B12) 1000 MCG tablet Take 1,000 mcg by mouth daily.     Glatiramer Acetate 40 MG/ML SOSY Inject 40 mg into the skin 3 (three) times a week. 12 mL 11   Omega 3 1000 MG CAPS Take by mouth.     Omeprazole 20 MG TBDD Take by mouth.     telmisartan (MICARDIS) 80 MG tablet TAKE 1 TABLET BY MOUTH ONCE DAILY **ANNUAL  APPOINTMENT  DUE  IN  MAY,  MUST  SEE  PROVIDER  FOR  FUTURE  REFILLS** 90 tablet 3   No current facility-administered medications for this visit.    REVIEW OF SYSTEMS  All other systems were reviewed and are negative     Objective:  Objective   There were no vitals filed for this visit. There is no height or weight on file to calculate BMI.  Physical Exam General: no acute distress Neck: Normal range of motion Cardiac: hemodynamically stable Pulm: normal work of breathing Neuro: alert, no focal deficit Extremities: no edema, cyanosis or wounds Vascular:   Right: Palpable radial  Left: Palpable radial   Data: CTA independently reviewed 60% stenosis of the left carotid bulb/proximal ICA Less than 50% stenosis of the right proximal ICA  Echo reviewed EF 55 to 60%, no RWMA, LV with some impaired relaxation, no valvular abnormalities.     Assessment/Plan:     Russell Newbern. is a 53 y.o. male with symptomatic left carotid stenosis and a recent left hemispheric stroke earlier this month with symptoms of  word finding difficulties.  Still with residual deficits.  I explained the 3 modalities of treatment with best medical therapy, carotid endarterectomy and carotid stenting.  I offered carotid endarterectomy given his stenosis, anatomy, and age.  We discussed the risks and benefits of carotid endarterectomy.  I informed that there is a small risk of stroke during the procedure which is about 1 to 2% but with medical therapy alone there is a 25% risk of stroke over the next 2 years.  I explained that with intervention, that risk drops down to around 10%.  We also discussed the risk of cranial nerve injury, bleeding, infection and the small risk of restenosis in the future. The patient and his mother expressed understanding and elected to proceed with carotid endarterectomy.  Will plan to schedule for left carotid endarterectomy next week  Recommendations to optimize cardiovascular risk: Abstinence from all tobacco products. Blood glucose control with goal A1c < 7%. Blood pressure control with goal blood pressure < 140/90 mmHg. Lipid reduction therapy with goal LDL-C <100 mg/dL  Aspirin 81mg  PO QD.  Atorvastatin 40-80mg  PO QD (or other "high intensity" statin therapy).     [name] MD [facility]

## 2023-04-05 ENCOUNTER — Encounter: Payer: Self-pay | Admitting: Vascular Surgery

## 2023-04-05 ENCOUNTER — Encounter: Payer: Self-pay | Admitting: Neurology

## 2023-04-05 ENCOUNTER — Other Ambulatory Visit: Payer: Self-pay

## 2023-04-05 ENCOUNTER — Ambulatory Visit: Payer: BC Managed Care – PPO | Admitting: Vascular Surgery

## 2023-04-05 VITALS — BP 120/72 | HR 55 | Temp 98.5°F | Resp 20 | Ht 67.0 in | Wt 190.0 lb

## 2023-04-05 DIAGNOSIS — I6522 Occlusion and stenosis of left carotid artery: Secondary | ICD-10-CM

## 2023-04-08 ENCOUNTER — Encounter (HOSPITAL_COMMUNITY): Payer: Self-pay | Admitting: Vascular Surgery

## 2023-04-08 ENCOUNTER — Other Ambulatory Visit: Payer: Self-pay

## 2023-04-08 NOTE — Anesthesia Preprocedure Evaluation (Signed)
 Anesthesia Evaluation  Patient identified by MRN, date of birth, ID band Patient awake    Reviewed: Allergy & Precautions, NPO status , Patient's Chart, lab work & pertinent test results, reviewed documented beta blocker date and time   History of Anesthesia Complications Negative for: history of anesthetic complications  Airway Mallampati: II  TM Distance: >3 FB Neck ROM: Limited    Dental  (+) Missing, Partial Upper   Pulmonary neg shortness of breath, asthma , neg sleep apnea, neg recent URI, neg PE   breath sounds clear to auscultation       Cardiovascular hypertension, (-) CAD, (-) Past MI, (-) Cardiac Stents and (-) CABG  Rhythm:Regular Rate:Normal     Neuro/Psych neg Seizures PSYCHIATRIC DISORDERS Anxiety  Bipolar Disorder   MS  Neuromuscular disease CVA, Residual Symptoms    GI/Hepatic ,GERD  Medicated and Controlled,,(+) neg Cirrhosis        Endo/Other    Renal/GU Renal disease     Musculoskeletal   Abdominal   Peds  Hematology   Anesthesia Other Findings   Reproductive/Obstetrics                              Anesthesia Physical Anesthesia Plan  ASA: 3  Anesthesia Plan: General   Post-op Pain Management:    Induction: Intravenous  PONV Risk Score and Plan: 2 and Ondansetron and Dexamethasone  Airway Management Planned: Oral ETT  Additional Equipment: Arterial line  Intra-op Plan:   Post-operative Plan: Extubation in OR  Informed Consent: I have reviewed the patients History and Physical, chart, labs and discussed the procedure including the risks, benefits and alternatives for the proposed anesthesia with the patient or authorized representative who has indicated his/her understanding and acceptance.     Dental advisory given  Plan Discussed with: CRNA  Anesthesia Plan Comments: (PAT note by Antionette Poles, PA-C: 53 year old male with pertinent history of HTN,  bipolar disorder, asthma, anxiety, GERD, relapsing remitting multiple sclerosis (maintained on glatiramer acetate 40 mg subcutaneous 3 times weekly).  He recently presented to the ED [facility] on 03/12/2023 after developing acute onset of word finding difficulty as well as difficulty getting words out.  He had evidence of stroke on MRI and was started on Plavix and atorvastatin.  He followed up with his neurologist Dr. Allena Katz on 03/19/2023.  Per note, she did not have records from recent hospitalization to review, recommended him continue ASA 81 mg and Plavix 75 mg as well as atorvastatin.  In follow-up telephone encounter 03/21/2023, Dr. Allena Katz stated, "Please let pt know that I have reviewed his records and it does show that he had a stroke which affects his ability to express words.  Imaging also shows that he has narrowing affecting the vessels in the brain as well as involving the carotid arteries, which is worse on the left.  I would like to refer him to vascular surgery for his left carotid stenosis  For his stroke management, please have him stay on aspirin 81mg  + plavix 75mg  for 3 months (not 3 weeks).  Continue both until he sees me again in May."  Patient was subsequently seen by Dr. Hetty Blend on 03/28/2023.  Carotid endarterectomy was recommended.  Patient will need day of surgery labs and evaluation.  EKG 03/12/2023 (scanned into media): Sinus rhythm with short PR.  Rate 61.  Incomplete right bundle branch block.  TTE 03/14/2023: EF 55 to 60%. Left ventricular diastolic function  has not impaired relaxation pattern. Left atrium is normal size. There is trace aortic regurgitation. Contra study is performed with 2 injections of 8 cc of agitated normal saline at rest and post Valsalva.  The bubble study was negative with no obvious evidence of shunting.  Nuclear stress 04/13/2019:  The left ventricular ejection fraction is normal (55-65%).  Nuclear stress EF: 59%.  T wave inversion was noted  during stress in the V4, V5 and V6 leads, beginning at 1 minutes of stress, and returning to baseline after less than 1 min of recovery.  This is a low risk study.   T wave inversions on ECG with normal perfusion images.    No ischemia or infarction on perfusion images.     )         Anesthesia Quick Evaluation

## 2023-04-08 NOTE — Progress Notes (Signed)
 SDW call  Patient and his mother were given pre-op instructions over the phone. They verbalized understanding of instructions provided.     PCP - Dr. Oliver Barre Neurologist: Dr. Allena Katz Cardiologist -  Pulmonary:    PPM/ICD - denies Device Orders - na Rep Notified - na   Chest x-ray - na EKG -  DOS, 04/09/2023 Stress Test - 04/13/2019 ECHO - 03/20/2023 Cardiac Cath -   Sleep Study/sleep apnea/CPAP: denies  Non-diabetic   Blood Thinner Instructions: Plavix, continue Aspirin Instructions:continue   ERAS Protcol - NPO   Anesthesia review: Yes. HTN, stroke, MS   Patient denies shortness of breath, fever, cough and chest pain over the phone call  Your procedure is scheduled on Tuesday April 09, 2023  Report to [facility] Main Entrance "A" at  0530  A.M., then check in with the Admitting office.  Call this number if you have problems the morning of surgery:  [phone]   If you have any questions prior to your surgery date call [phone]: Open Monday-Friday 8am-4pm If you experience any cold or flu symptoms such as cough, fever, chills, shortness of breath, etc. between now and your scheduled surgery, please notify us at the above number    Remember:  Do not eat or drink after midnight the night before your surgery  Take these medicines the morning of surgery with A SIP OF WATER:  Amlodipine, ASA  As of today, STOP taking any Aleve, Naproxen, Ibuprofen, Motrin, Advil, Goody's, BC's, all herbal medications, fish oil, and all vitamins.

## 2023-04-08 NOTE — Progress Notes (Signed)
 Anesthesia Chart Review: Same day workup  53 year old male with pertinent history of HTN, bipolar disorder, asthma, anxiety, GERD, relapsing remitting multiple sclerosis (maintained on glatiramer acetate 40 mg subcutaneous 3 times weekly).  He recently presented to the ED [facility] on 03/12/2023 after developing acute onset of word finding difficulty as well as difficulty getting words out.  He had evidence of stroke on MRI and was started on Plavix and atorvastatin.  He followed up with his neurologist Dr. Allena Katz on 03/19/2023.  Per note, she did not have records from recent hospitalization to review, recommended him continue ASA 81 mg and Plavix 75 mg as well as atorvastatin.  In follow-up telephone encounter 03/21/2023, Dr. Allena Katz stated, "Please let pt know that I have reviewed his records and it does show that he had a stroke which affects his ability to express words.  Imaging also shows that he has narrowing affecting the vessels in the brain as well as involving the carotid arteries, which is worse on the left.  I would like to refer him to vascular surgery for his left carotid stenosis  For his stroke management, please have him stay on aspirin 81mg  + plavix 75mg  for 3 months (not 3 weeks).  Continue both until he sees me again in May."  Patient was subsequently seen by Dr. Hetty Blend on 03/28/2023.  Carotid endarterectomy was recommended.  Patient will need day of surgery labs and evaluation.  EKG 03/12/2023 (scanned into media): Sinus rhythm with short PR.  Rate 61.  Incomplete right bundle branch block.  TTE 03/14/2023: EF 55 to 60%. Left ventricular diastolic function has not impaired relaxation pattern. Left atrium is normal size. There is trace aortic regurgitation. Contra study is performed with 2 injections of 8 cc of agitated normal saline at rest and post Valsalva.  The bubble study was negative with no obvious evidence of shunting.  Nuclear stress 04/13/2019: The left ventricular  ejection fraction is normal (55-65%). Nuclear stress EF: 59%. T wave inversion was noted during stress in the V4, V5 and V6 leads, beginning at 1 minutes of stress, and returning to baseline after less than 1 min of recovery. This is a low risk study.   T wave inversions on ECG with normal perfusion images.    No ischemia or infarction on perfusion images.       Zannie Cove [facility] Short Stay Center/Anesthesiology Phone [phone] 04/08/2023 10:23 AM

## 2023-04-09 ENCOUNTER — Inpatient Hospital Stay (HOSPITAL_COMMUNITY)
Admission: RE | Admit: 2023-04-09 | Discharge: 2023-04-11 | DRG: 039 | Disposition: A | Discharge status: Home / Self Care | Payer: BC Managed Care – PPO | Attending: Vascular Surgery | Admitting: Vascular Surgery

## 2023-04-09 ENCOUNTER — Inpatient Hospital Stay (HOSPITAL_COMMUNITY): Payer: Self-pay | Admitting: Physician Assistant

## 2023-04-09 ENCOUNTER — Other Ambulatory Visit: Payer: Self-pay

## 2023-04-09 ENCOUNTER — Encounter (HOSPITAL_COMMUNITY)
Admission: RE | Disposition: A | Discharge status: Home / Self Care | Payer: Self-pay | Source: Home / Self Care | Attending: Vascular Surgery

## 2023-04-09 ENCOUNTER — Encounter (HOSPITAL_COMMUNITY): Payer: Self-pay | Admitting: Vascular Surgery

## 2023-04-09 DIAGNOSIS — I6529 Occlusion and stenosis of unspecified carotid artery: Secondary | ICD-10-CM | POA: Diagnosis present

## 2023-04-09 DIAGNOSIS — Z9049 Acquired absence of other specified parts of digestive tract: Secondary | ICD-10-CM

## 2023-04-09 DIAGNOSIS — F319 Bipolar disorder, unspecified: Secondary | ICD-10-CM | POA: Diagnosis present

## 2023-04-09 DIAGNOSIS — Z79899 Other long term (current) drug therapy: Secondary | ICD-10-CM | POA: Diagnosis not present

## 2023-04-09 DIAGNOSIS — Z825 Family history of asthma and other chronic lower respiratory diseases: Secondary | ICD-10-CM

## 2023-04-09 DIAGNOSIS — Z833 Family history of diabetes mellitus: Secondary | ICD-10-CM

## 2023-04-09 DIAGNOSIS — Z823 Family history of stroke: Secondary | ICD-10-CM | POA: Diagnosis not present

## 2023-04-09 DIAGNOSIS — R001 Bradycardia, unspecified: Secondary | ICD-10-CM | POA: Diagnosis not present

## 2023-04-09 DIAGNOSIS — Z7982 Long term (current) use of aspirin: Secondary | ICD-10-CM

## 2023-04-09 DIAGNOSIS — G35 Multiple sclerosis: Secondary | ICD-10-CM | POA: Diagnosis present

## 2023-04-09 DIAGNOSIS — Z820 Family history of epilepsy and other diseases of the nervous system: Secondary | ICD-10-CM | POA: Diagnosis not present

## 2023-04-09 DIAGNOSIS — F419 Anxiety disorder, unspecified: Secondary | ICD-10-CM | POA: Diagnosis present

## 2023-04-09 DIAGNOSIS — Z8673 Personal history of transient ischemic attack (TIA), and cerebral infarction without residual deficits: Secondary | ICD-10-CM | POA: Diagnosis not present

## 2023-04-09 DIAGNOSIS — I6522 Occlusion and stenosis of left carotid artery: Secondary | ICD-10-CM | POA: Diagnosis present

## 2023-04-09 DIAGNOSIS — Z888 Allergy status to other drugs, medicaments and biological substances status: Secondary | ICD-10-CM | POA: Diagnosis not present

## 2023-04-09 DIAGNOSIS — Z7902 Long term (current) use of antithrombotics/antiplatelets: Secondary | ICD-10-CM

## 2023-04-09 DIAGNOSIS — Z8249 Family history of ischemic heart disease and other diseases of the circulatory system: Secondary | ICD-10-CM

## 2023-04-09 DIAGNOSIS — I10 Essential (primary) hypertension: Secondary | ICD-10-CM | POA: Diagnosis present

## 2023-04-09 DIAGNOSIS — Z880 Allergy status to penicillin: Secondary | ICD-10-CM

## 2023-04-09 DIAGNOSIS — K219 Gastro-esophageal reflux disease without esophagitis: Secondary | ICD-10-CM | POA: Diagnosis present

## 2023-04-09 HISTORY — PX: ENDARTERECTOMY: SHX5162

## 2023-04-09 HISTORY — PX: PATCH ANGIOPLASTY: SHX6230

## 2023-04-09 HISTORY — DX: Cerebral infarction, unspecified: I63.9

## 2023-04-09 LAB — POCT ACTIVATED CLOTTING TIME
Activated Clotting Time: 227 s
Activated Clotting Time: 279 s
Activated Clotting Time: 118 s

## 2023-04-09 LAB — COMPREHENSIVE METABOLIC PANEL
ALT: 50 U/L — ABNORMAL HIGH (ref 0–44)
AST: 31 U/L (ref 15–41)
Albumin: 4.1 g/dL (ref 3.5–5.0)
Alkaline Phosphatase: 81 U/L (ref 38–126)
Anion gap: 9 (ref 5–15)
BUN: 11 mg/dL (ref 6–20)
CO2: 24 mmol/L (ref 22–32)
Calcium: 9.8 mg/dL (ref 8.9–10.3)
Chloride: 105 mmol/L (ref 98–111)
Creatinine, Ser: 0.99 mg/dL (ref 0.61–1.24)
GFR, Estimated: >60 mL/min (ref >60)
Glucose, Bld: 115 mg/dL — ABNORMAL HIGH (ref 70–99)
Potassium: 3.9 mmol/L (ref 3.5–5.1)
Sodium: 138 mmol/L (ref 135–145)
Total Bilirubin: 1 mg/dL (ref 0.0–1.2)
Total Protein: 7.4 g/dL (ref 6.5–8.1)

## 2023-04-09 LAB — APTT: aPTT: 28 s (ref 24–36)

## 2023-04-09 LAB — URINALYSIS, ROUTINE W REFLEX MICROSCOPIC
Bilirubin Urine: NEGATIVE
Glucose, UA: NEGATIVE mg/dL
Hgb urine dipstick: NEGATIVE
Ketones, ur: 5 mg/dL — AB
Leukocytes,Ua: NEGATIVE
Nitrite: NEGATIVE
Protein, ur: NEGATIVE mg/dL
Specific Gravity, Urine: 1.02 (ref 1.005–1.030)
pH: 5 (ref 5.0–8.0)

## 2023-04-09 LAB — ABO/RH: ABO/RH(D): O POS

## 2023-04-09 LAB — SURGICAL PCR SCREEN
MRSA, PCR: NEGATIVE
Staphylococcus aureus: NEGATIVE

## 2023-04-09 LAB — PROTIME-INR
INR: 1 (ref 0.8–1.2)
Prothrombin Time: 12.8 s (ref 11.4–15.2)

## 2023-04-09 LAB — CBC
HCT: 43.7 % (ref 39.0–52.0)
Hemoglobin: 15 g/dL (ref 13.0–17.0)
MCH: 30.2 pg (ref 26.0–34.0)
MCHC: 34.3 g/dL (ref 30.0–36.0)
MCV: 88.1 fL (ref 80.0–100.0)
Platelets: 157 10*3/uL (ref 150–400)
RBC: 4.96 MIL/uL (ref 4.22–5.81)
RDW: 13.2 % (ref 11.5–15.5)
WBC: 6.2 10*3/uL (ref 4.0–10.5)
nRBC: 0 % (ref 0.0–0.2)

## 2023-04-09 LAB — TYPE AND SCREEN
ABO/RH(D): O POS
Antibody Screen: NEGATIVE

## 2023-04-09 SURGERY — ENDARTERECTOMY, CAROTID
Anesthesia: General | Site: Neck | Laterality: Left

## 2023-04-09 MED ORDER — ACETAMINOPHEN 10 MG/ML IV SOLN
1000.0000 mg | Freq: Once | INTRAVENOUS | Status: DC | PRN
  Administered 2023-04-09: 1000 mg via INTRAVENOUS

## 2023-04-09 MED ORDER — CHLORHEXIDINE GLUCONATE 0.12 % MT SOLN
15.0000 mL | Freq: Once | OROMUCOSAL | Status: AC
  Administered 2023-04-09: 15 mL via OROMUCOSAL
  Filled 2023-04-09: qty 15

## 2023-04-09 MED ORDER — SUGAMMADEX SODIUM 200 MG/2ML IV SOLN
INTRAVENOUS | Status: DC | PRN
Start: 2023-04-09 — End: 2023-04-09
  Administered 2023-04-09: 200 mg via INTRAVENOUS

## 2023-04-09 MED ORDER — FENTANYL CITRATE (PF) 100 MCG/2ML IJ SOLN: 25.0000 ug | INTRAMUSCULAR | Status: DC | PRN

## 2023-04-09 MED ORDER — SODIUM CHLORIDE 0.9 % IV SOLN
INTRAVENOUS | Status: DC
Start: 2023-04-09 — End: 2023-04-09

## 2023-04-09 MED ORDER — SUGAMMADEX SODIUM 200 MG/2ML IV SOLN
INTRAVENOUS | Status: AC
  Filled 2023-04-09: qty 2

## 2023-04-09 MED ORDER — LACTATED RINGERS IV SOLN: INTRAVENOUS | Status: DC | PRN

## 2023-04-09 MED ORDER — AMLODIPINE BESYLATE 10 MG PO TABS
10.0000 mg | ORAL_TABLET | Freq: Every day | ORAL | Status: DC
  Administered 2023-04-11: 10 mg via ORAL
  Filled 2023-04-09: qty 1

## 2023-04-09 MED ORDER — GLYCOPYRROLATE PF 0.2 MG/ML IJ SOSY
PREFILLED_SYRINGE | INTRAMUSCULAR | Status: AC
  Filled 2023-04-09: qty 1

## 2023-04-09 MED ORDER — PROTAMINE SULFATE 10 MG/ML IV SOLN
INTRAVENOUS | Status: DC | PRN
  Administered 2023-04-09: 40 mg via INTRAVENOUS

## 2023-04-09 MED ORDER — HEPARIN 6000 UNIT IRRIGATION SOLUTION
Status: AC
  Filled 2023-04-09: qty 500

## 2023-04-09 MED ORDER — PROTAMINE SULFATE 10 MG/ML IV SOLN
INTRAVENOUS | Status: AC
  Filled 2023-04-09: qty 5

## 2023-04-09 MED ORDER — ALUM & MAG HYDROXIDE-SIMETH 200-200-20 MG/5ML PO SUSP: 15.0000 mL | ORAL | Status: DC | PRN

## 2023-04-09 MED ORDER — CLOPIDOGREL BISULFATE 75 MG PO TABS
75.0000 mg | ORAL_TABLET | Freq: Every day | ORAL | Status: DC
Start: 2023-04-10 — End: 2023-04-11
  Administered 2023-04-10: 75 mg via ORAL
  Filled 2023-04-09: qty 1

## 2023-04-09 MED ORDER — FENTANYL CITRATE (PF) 100 MCG/2ML IJ SOLN
INTRAMUSCULAR | Status: AC
  Filled 2023-04-09: qty 2

## 2023-04-09 MED ORDER — CHLORHEXIDINE GLUCONATE CLOTH 2 % EX PADS: 6.0000 | MEDICATED_PAD | Freq: Once | CUTANEOUS | Status: DC

## 2023-04-09 MED ORDER — PROPOFOL 10 MG/ML IV BOLUS
INTRAVENOUS | Status: AC
  Filled 2023-04-09: qty 20

## 2023-04-09 MED ORDER — HYDRALAZINE HCL 20 MG/ML IJ SOLN: 5.0000 mg | INTRAMUSCULAR | Status: DC | PRN

## 2023-04-09 MED ORDER — ONDANSETRON HCL 4 MG/2ML IJ SOLN
INTRAMUSCULAR | Status: DC | PRN
  Administered 2023-04-09: 4 mg via INTRAVENOUS

## 2023-04-09 MED ORDER — LIDOCAINE 2% (20 MG/ML) 5 ML SYRINGE
INTRAMUSCULAR | Status: DC | PRN
  Administered 2023-04-09: 80 mg via INTRAVENOUS

## 2023-04-09 MED ORDER — LIDOCAINE HCL (PF) 1 % IJ SOLN
INTRAMUSCULAR | Status: AC
  Filled 2023-04-09: qty 30

## 2023-04-09 MED ORDER — OXYCODONE-ACETAMINOPHEN 5-325 MG PO TABS: 1.0000 | ORAL_TABLET | ORAL | Status: DC | PRN

## 2023-04-09 MED ORDER — MIDAZOLAM HCL 2 MG/2ML IJ SOLN
INTRAMUSCULAR | Status: AC
  Filled 2023-04-09: qty 2

## 2023-04-09 MED ORDER — HEMOSTATIC AGENTS (NO CHARGE) OPTIME
TOPICAL | Status: DC | PRN
  Administered 2023-04-09: 1 via TOPICAL

## 2023-04-09 MED ORDER — PROPOFOL 10 MG/ML IV BOLUS
INTRAVENOUS | Status: DC | PRN
  Administered 2023-04-09: 120 mg via INTRAVENOUS

## 2023-04-09 MED ORDER — ROCURONIUM BROMIDE 10 MG/ML (PF) SYRINGE
PREFILLED_SYRINGE | INTRAVENOUS | Status: DC | PRN
  Administered 2023-04-09: 50 mg via INTRAVENOUS
  Administered 2023-04-09: 20 mg via INTRAVENOUS
  Administered 2023-04-09: 30 mg via INTRAVENOUS

## 2023-04-09 MED ORDER — DEXAMETHASONE SODIUM PHOSPHATE 10 MG/ML IJ SOLN
INTRAMUSCULAR | Status: AC
  Filled 2023-04-09: qty 1

## 2023-04-09 MED ORDER — OXYCODONE HCL 5 MG/5ML PO SOLN: 5.0000 mg | Freq: Once | ORAL | Status: DC | PRN

## 2023-04-09 MED ORDER — MAGNESIUM SULFATE 2 GM/50ML IV SOLN: 2.0000 g | Freq: Every day | INTRAVENOUS | Status: DC | PRN

## 2023-04-09 MED ORDER — ACETAMINOPHEN 325 MG PO TABS: 325.0000 mg | ORAL_TABLET | ORAL | Status: DC | PRN

## 2023-04-09 MED ORDER — PHENYLEPHRINE 80 MCG/ML (10ML) SYRINGE FOR IV PUSH (FOR BLOOD PRESSURE SUPPORT)
PREFILLED_SYRINGE | INTRAVENOUS | Status: DC | PRN
  Administered 2023-04-09: 160 ug via INTRAVENOUS
  Administered 2023-04-09 (×3): 80 ug via INTRAVENOUS

## 2023-04-09 MED ORDER — ONDANSETRON HCL 4 MG/2ML IJ SOLN: 4.0000 mg | Freq: Once | INTRAMUSCULAR | Status: DC | PRN

## 2023-04-09 MED ORDER — OXYCODONE HCL 5 MG PO TABS: 5.0000 mg | ORAL_TABLET | Freq: Once | ORAL | Status: DC | PRN

## 2023-04-09 MED ORDER — GUAIFENESIN-DM 100-10 MG/5ML PO SYRP: 15.0000 mL | ORAL_SOLUTION | ORAL | Status: DC | PRN

## 2023-04-09 MED ORDER — HEPARIN SODIUM (PORCINE) 1000 UNIT/ML IJ SOLN
INTRAMUSCULAR | Status: AC
  Filled 2023-04-09: qty 10

## 2023-04-09 MED ORDER — HEPARIN SODIUM (PORCINE) 1000 UNIT/ML IJ SOLN
INTRAMUSCULAR | Status: DC | PRN
  Administered 2023-04-09: 2000 [IU] via INTRAVENOUS
  Administered 2023-04-09: 8000 [IU] via INTRAVENOUS

## 2023-04-09 MED ORDER — FENTANYL CITRATE (PF) 250 MCG/5ML IJ SOLN
INTRAMUSCULAR | Status: AC
  Filled 2023-04-09: qty 5

## 2023-04-09 MED ORDER — CEFAZOLIN SODIUM-DEXTROSE 2-4 GM/100ML-% IV SOLN
2.0000 g | Freq: Three times a day (TID) | INTRAVENOUS | Status: AC
  Administered 2023-04-09 (×2): 2 g via INTRAVENOUS
  Filled 2023-04-09 (×2): qty 100

## 2023-04-09 MED ORDER — PHENOL 1.4 % MT LIQD: 1.0000 | OROMUCOSAL | Status: DC | PRN

## 2023-04-09 MED ORDER — ROCURONIUM BROMIDE 10 MG/ML (PF) SYRINGE
PREFILLED_SYRINGE | INTRAVENOUS | Status: AC
  Filled 2023-04-09: qty 10

## 2023-04-09 MED ORDER — ACETAMINOPHEN 10 MG/ML IV SOLN
INTRAVENOUS | Status: AC
  Filled 2023-04-09: qty 100

## 2023-04-09 MED ORDER — GLYCOPYRROLATE 0.2 MG/ML IJ SOLN
INTRAMUSCULAR | Status: DC | PRN
  Administered 2023-04-09: .2 mg via INTRAVENOUS

## 2023-04-09 MED ORDER — LIDOCAINE 2% (20 MG/ML) 5 ML SYRINGE
INTRAMUSCULAR | Status: AC
  Filled 2023-04-09: qty 5

## 2023-04-09 MED ORDER — PHENYLEPHRINE HCL-NACL 20-0.9 MG/250ML-% IV SOLN
INTRAVENOUS | Status: DC | PRN
Start: 2023-04-09 — End: 2023-04-09
  Administered 2023-04-09: 40 ug/min via INTRAVENOUS

## 2023-04-09 MED ORDER — DEXAMETHASONE SODIUM PHOSPHATE 10 MG/ML IJ SOLN
INTRAMUSCULAR | Status: DC | PRN
  Administered 2023-04-09: 10 mg via INTRAVENOUS

## 2023-04-09 MED ORDER — SODIUM CHLORIDE 0.9 % IV SOLN: 500.0000 mL | Freq: Once | INTRAVENOUS | Status: DC | PRN

## 2023-04-09 MED ORDER — ONDANSETRON HCL 4 MG/2ML IJ SOLN: 4.0000 mg | Freq: Four times a day (QID) | INTRAMUSCULAR | Status: DC | PRN

## 2023-04-09 MED ORDER — LABETALOL HCL 5 MG/ML IV SOLN: 10.0000 mg | INTRAVENOUS | Status: DC | PRN

## 2023-04-09 MED ORDER — HEPARIN 6000 UNIT IRRIGATION SOLUTION
Status: DC | PRN
  Administered 2023-04-09: 1

## 2023-04-09 MED ORDER — ASPIRIN 81 MG PO TBEC
81.0000 mg | DELAYED_RELEASE_TABLET | Freq: Every day | ORAL | Status: DC
Start: 2023-04-10 — End: 2023-04-11
  Administered 2023-04-10 – 2023-04-11 (×2): 81 mg via ORAL
  Filled 2023-04-09 (×2): qty 1

## 2023-04-09 MED ORDER — SODIUM CHLORIDE 0.9 % IV SOLN: INTRAVENOUS | Status: AC

## 2023-04-09 MED ORDER — ONDANSETRON HCL 4 MG/2ML IJ SOLN
INTRAMUSCULAR | Status: AC
  Filled 2023-04-09: qty 2

## 2023-04-09 MED ORDER — ACETAMINOPHEN 650 MG RE SUPP: 325.0000 mg | RECTAL | Status: DC | PRN

## 2023-04-09 MED ORDER — PHENYLEPHRINE 80 MCG/ML (10ML) SYRINGE FOR IV PUSH (FOR BLOOD PRESSURE SUPPORT)
PREFILLED_SYRINGE | INTRAVENOUS | Status: AC
  Filled 2023-04-09: qty 10

## 2023-04-09 MED ORDER — MORPHINE SULFATE (PF) 2 MG/ML IV SOLN: 2.0000 mg | INTRAVENOUS | Status: DC | PRN

## 2023-04-09 MED ORDER — MIDAZOLAM HCL 2 MG/2ML IJ SOLN
INTRAMUSCULAR | Status: DC | PRN
  Administered 2023-04-09: 2 mg via INTRAVENOUS

## 2023-04-09 MED ORDER — PANTOPRAZOLE SODIUM 40 MG PO TBEC
40.0000 mg | DELAYED_RELEASE_TABLET | Freq: Every day | ORAL | Status: DC
  Administered 2023-04-09 – 2023-04-11 (×3): 40 mg via ORAL
  Filled 2023-04-09 (×3): qty 1

## 2023-04-09 MED ORDER — ATORVASTATIN CALCIUM 40 MG PO TABS
40.0000 mg | ORAL_TABLET | Freq: Every day | ORAL | Status: DC
  Administered 2023-04-09 – 2023-04-10 (×2): 40 mg via ORAL
  Filled 2023-04-09 (×2): qty 1

## 2023-04-09 MED ORDER — ORAL CARE MOUTH RINSE: 15.0000 mL | Freq: Once | OROMUCOSAL | Status: AC

## 2023-04-09 MED ORDER — FENTANYL CITRATE (PF) 250 MCG/5ML IJ SOLN
INTRAMUSCULAR | Status: DC | PRN
  Administered 2023-04-09: 50 ug via INTRAVENOUS
  Administered 2023-04-09: 100 ug via INTRAVENOUS

## 2023-04-09 MED ORDER — DOCUSATE SODIUM 100 MG PO CAPS
100.0000 mg | ORAL_CAPSULE | Freq: Every day | ORAL | Status: DC
  Administered 2023-04-10: 100 mg via ORAL
  Filled 2023-04-09 (×2): qty 1

## 2023-04-09 MED ORDER — METOPROLOL TARTRATE 5 MG/5ML IV SOLN: 2.0000 mg | INTRAVENOUS | Status: DC | PRN

## 2023-04-09 MED ORDER — 0.9 % SODIUM CHLORIDE (POUR BTL) OPTIME
TOPICAL | Status: DC | PRN
  Administered 2023-04-09 (×2): 1000 mL

## 2023-04-09 MED ORDER — POTASSIUM CHLORIDE CRYS ER 20 MEQ PO TBCR: 20.0000 meq | EXTENDED_RELEASE_TABLET | Freq: Every day | ORAL | Status: DC | PRN

## 2023-04-09 MED ORDER — VANCOMYCIN HCL IN DEXTROSE 1-5 GM/200ML-% IV SOLN
1000.0000 mg | INTRAVENOUS | Status: AC
  Administered 2023-04-09: 1000 mg via INTRAVENOUS
  Filled 2023-04-09: qty 200

## 2023-04-09 MED ORDER — IRBESARTAN 300 MG PO TABS
300.0000 mg | ORAL_TABLET | Freq: Every day | ORAL | Status: DC
Start: 2023-04-11 — End: 2023-04-11
  Administered 2023-04-11: 300 mg via ORAL
  Filled 2023-04-09: qty 1

## 2023-04-09 SURGICAL SUPPLY — 31 items
BAG COUNTER SPONGE SURGICOUNT (BAG) ×1 IMPLANT
CANISTER SUCT 3000ML PPV (MISCELLANEOUS) ×1 IMPLANT
CATH ROBINSON RED A/P 18FR (CATHETERS) ×1 IMPLANT
CLIP TI MEDIUM 6 (CLIP) IMPLANT
CLIP TI WIDE RED SMALL 6 (CLIP) IMPLANT
COVER SURGIBOOT TRANSDUCER 6 (DISPOSABLE) ×1 IMPLANT
DERMABOND ADVANCED .7 DNX12 (GAUZE/BANDAGES/DRESSINGS) ×1 IMPLANT
ELECT REM PT RETURN 9FT ADLT (ELECTROSURGICAL) ×1 IMPLANT
ELECTRODE REM PT RTRN 9FT ADLT (ELECTROSURGICAL) ×1 IMPLANT
GLOVE BIOGEL PI IND STRL 7.0 (GLOVE) ×1 IMPLANT
GOWN STRL REUS W/ TWL LRG LVL3 (GOWN DISPOSABLE) ×2 IMPLANT
GOWN STRL REUS W/ TWL XL LVL3 (GOWN DISPOSABLE) ×1 IMPLANT
HEMOSTAT SNOW SURGICEL 2X4 (HEMOSTASIS) IMPLANT
KIT BASIN OR (CUSTOM PROCEDURE TRAY) ×1 IMPLANT
KIT SHUNT ARGYLE CAROTID ART 6 (VASCULAR PRODUCTS) ×1 IMPLANT
KIT TURNOVER KIT B (KITS) ×1 IMPLANT
LOOP VESSEL MINI RED (MISCELLANEOUS) IMPLANT
NDL HYPO 25GX1X1/2 BEV (NEEDLE) IMPLANT
NEEDLE HYPO 25GX1X1/2 BEV (NEEDLE) IMPLANT
NS IRRIG 1000ML POUR BTL (IV SOLUTION) ×3 IMPLANT
PACK CAROTID (CUSTOM PROCEDURE TRAY) ×1 IMPLANT
PAD ARMBOARD 7.5X6 YLW CONV (MISCELLANEOUS) ×2 IMPLANT
PATCH VASC XENOSURE 1X6 (Vascular Products) IMPLANT
POSITIONER HEAD DONUT 9IN (MISCELLANEOUS) ×1 IMPLANT
SUT MNCRL AB 4-0 PS2 18 (SUTURE) ×1 IMPLANT
SUT PROLENE 6 0 BV (SUTURE) ×2 IMPLANT
SUT PROLENE 7 0 BV 1 (SUTURE) ×2 IMPLANT
SUT VIC AB 2-0 CT1 TAPERPNT 27 (SUTURE) ×1 IMPLANT
SUT VIC AB 3-0 SH 27X BRD (SUTURE) ×1 IMPLANT
TOWEL GREEN STERILE (TOWEL DISPOSABLE) ×1 IMPLANT
WATER STERILE IRR 1000ML POUR (IV SOLUTION) ×1 IMPLANT

## 2023-04-09 NOTE — Progress Notes (Addendum)
 Dr. Hetty Blend made aware that urinalysis is still in process. Ok to proceed to OR without waiting on results per Dr. Hetty Blend. 0720- Urinalysis resulted.

## 2023-04-09 NOTE — Anesthesia Procedure Notes (Signed)
 Procedure Name: Intubation Date/Time: 04/09/2023 7:42 AM  Performed by: Georgianne Fick D, CRNAPre-anesthesia Checklist: Patient identified, Emergency Drugs available, Suction available and Patient being monitored Patient Re-evaluated:Patient Re-evaluated prior to induction Oxygen Delivery Method: Circle System Utilized Preoxygenation: Pre-oxygenation with 100% oxygen Induction Type: IV induction Ventilation: Mask ventilation without difficulty Laryngoscope Size: Miller and 3 Grade View: Grade I Tube type: Oral Number of attempts: 1 Airway Equipment and Method: Stylet and Oral airway Placement Confirmation: ETT inserted through vocal cords under direct vision, positive ETCO2 and breath sounds checked- equal and bilateral Secured at: 22 cm Tube secured with: Tape Dental Injury: Teeth and Oropharynx as per pre-operative assessment

## 2023-04-09 NOTE — Progress Notes (Signed)
 Patient received from PACU, V/S obtained, CCMD notified, patient is alert and oriented *4, NIH 0, incision site is level 0, orientation provided about the unit, call bell in reach.   04/09/23 1546  Vitals  Temp 97.8 F (36.6 C)  Temp Source Oral  BP (!) 117/59  MAP (mmHg) 77  BP Location Left Arm  BP Method Automatic  Patient Position (if appropriate) Lying  Pulse Rate (!) 43  Pulse Rate Source Monitor  ECG Heart Rate (!) 43  Resp 18  MEWS COLOR  MEWS Score Color Green  Oxygen Therapy  SpO2 93 %  O2 Device Room Air  Art Line  Arterial Line BP 139/46  Arterial Line MAP (mmHg) 69 mmHg  MEWS Score  MEWS Temp 0  MEWS Systolic 0  MEWS Pulse 1  MEWS RR 0  MEWS LOC 0  MEWS Score 1

## 2023-04-09 NOTE — Interval H&P Note (Signed)
 History and Physical Interval Note:  04/09/2023 7:23 AM  Russell Moses.  has presented today for surgery, with the diagnosis of LEFT CAROTID STENOSIS.  The various methods of treatment have been discussed with the patient and family. After consideration of risks, benefits and other options for treatment, the patient has consented to  Procedure(s): ENDARTERECTOMY CAROTID (Left) as a surgical intervention.  The patient's history has been reviewed, patient examined, no change in status, stable for surgery.  I have reviewed the patient's chart and labs.  Questions were answered to the patient's satisfaction.     [name]

## 2023-04-09 NOTE — Op Note (Signed)
 OPERATIVE NOTE  PROCEDURE:   1.  left carotid endarterectomy with bovine patch angioplasty 2.  left intraoperative carotid ultrasound  PRE-OPERATIVE DIAGNOSIS: left symptomatic carotid stenosis  POST-OPERATIVE DIAGNOSIS: same as above   SURGEON: [name] MD  ASSISTANT(S): Aggie Moats, PA  Given the complexity of the case,  the assistant was necessary in order to expedient the procedure and safely perform the technical aspects of the operation.  The assistant provided traction and countertraction to assist with exposure of the common carotid artery, external carotid artery, and internal carotid artery.  They also assisted with suture ligatures and dividing the facial vein and multiple small venous branches tethering the hypoglossal nerve. The assistant also played a critical role in placing the shunt safely.  In addition they were necessary to provide adequate traction and countertraction to perform a precise endarterectomy and precise closure.  These skills, especially following the Prolene suture for the anastomosis, could not have been adequately performed by a scrub tech assistant.   ANESTHESIA: general  ESTIMATED BLOOD LOSS: 25 cc  FINDING(S): Continuous Doppler audible flow signatures are appropriate for each carotid artery No evidence of intimal flap visualized on transverse or longitudinal ultrasonography Calcific and ulcerated carotid plaque  SPECIMEN(S): None  INDICATIONS:   Russell Moses. is a 53 y.o. male who presents with left symptomatic carotid stenosis 60%.  I discussed with the patient the risks, benefits, and alternatives to carotid endarterectomy.  I discussed the procedural details of carotid endarterectomy with the patient.  The patient is aware that the risks of carotid endarterectomy include but are not limited to: bleeding, infection, stroke, myocardial infarction, death, cranial nerve injuries both temporary and permanent, neck hematoma, possible  airway compromise, labile blood pressure post-operatively, cerebral hyperperfusion syndrome, and possible need for additional interventions in the future. The patient is aware of the risks and agrees to proceed forward with the procedure.  DESCRIPTION: After full informed written consent was obtained from the patient, the patient was brought back to the operating room and placed supine upon the operating table.  Prior to induction, the patient received IV antibiotics.  After obtaining adequate anesthesia, the patient was placed into semi-Fowler position with a shoulder roll in place and the patient's neck slightly hyperextended and rotated away from the surgical site.  The patient was prepped in the standard fashion for a left carotid endarterectomy. The left carotid artery and bifurcation were mapped with a sterile ultrasound and marked. I made an incision anterior to the sternocleidomastoid muscle and dissected down through the subcutaneous tissue.  The platysma was opened with electrocautery.  Then I dissected down to the internal jugular vein.  This was dissected medially and posteriorly until I obtained visualization of the common carotid artery.  The common carotid artery was dissected out and then an umbilical tape was placed around the common carotid artery and I loosely applied a Rumel tourniquet.  I then dissected in a periadventitial fashion along the common carotid artery up to the bifurcation. I identified the facial vein, this was ligated and then transected.  I then identified the external carotid artery and the superior thyroid artery.  A 2-0 silk tie was looped around the superior thyroid artery, and a vessel loop around the external carotid. At this point, we gave the patient a therapeutic bolus of Heparin intravenously (roughly 80 units/kg) and ACT's were monitored and heparin re-dosed for a goal ACT of 250. I then continued the dissection distally on to the internal  carotid artery. In the  process of this dissection, the hypoglossal nerve was identified and protected throughout the case.  I then dissected out the internal carotid artery until I identified an area of soft tissue in the internal carotid artery.  I dissected slightly distal to this area, and placed a vessel loop that was kept loose.  I then prepared a 10 argyle shunt with heparinized flush and a silk tie at the center in order to be ready to shunt if needed.  I clamped the internal carotid artery, external carotid artery and then the common carotid artery.  I then made an arteriotomy in the common carotid artery with a 11 blade, and extended the arteriotomy with a Potts scissor down into the common carotid artery, then I carried the arteriotomy through the bifurcation into the internal carotid artery until I reached an area that was not diseased.  I briefly unclamped the ICA which demonstrated excellent back bleeding and I decided to proceed without shunting. At this point, I started the endarterectomy in the common carotid artery with a Cytogeneticist and carried this down into the common carotid artery circumferentially.  Then I transected the plaque at a segment where it was adherent.  I then carried the endarterectomy up into the external carotid artery.  The plaque was extracted by unclamping the external carotid artery and everting the artery.  The endarterectomy was then carried into the internal carotid artery, extracting the remaining portion of the carotid plaque which feathered nicely at the distal endpoint.  I passed the plaque off the field as a specimen.    I then spent the next 30 minutes removing intimal flaps and loose debris.  Eventually I reached the point where the residual plaque was densely adherent and any further dissection would compromise the integrity of the wall.  After verifying that there was no more loose intimal flaps or debris, I re-interrogated the entirety of this carotid artery.  At this point, I  was satisfied that the minimal remaining disease was densely adherent to the wall and wall integrity was intact.  I then fashioned a bovine pericardial patch for the geometry of this artery and sewed it in place with a running stitch of 6-0 Prolene.  Prior to completing this patch angioplasty, I allowed all vessels to back bleed and then copoiously irrigated the endarterectomy site with heparinized saline. I then completed the patch angioplasty in the usual fashion.  First, I released the clamp on the external carotid artery, then I released the common carotid artery.  After waiting about 10 heart beats, I released the clamp on the internal carotid artery.  I then interrogated the arterial flow with the continuous Doppler.  The audible waveforms in each artery were consistent with the expected characteristics for each artery.  The Sonosite probe was then sterilely draped and used to interrogate the carotid artery in both longitudinal and transverse views.  At this point, I washed out the wound, and placed a hemostatic agent throughout. I also gave the patient 30 mg of protamine to reverse anticoagulation.  After waiting a few minutes, I removed the hemostatic agent and washed out the wound. After a few repair stitches with 6-0 prolene, there was no active bleeding in the surgical site. I then reapproximated the SCM to the connective tissue medially. The platysma was then closed with a running stitch of 3-0 Vicryl.  The skin was then reapproximated with a running subcuticular 4-0 Monocryl stitch.  The skin was then  cleaned, dried and Dermabond was used to reinforce the skin closure.  The patient woke without any problems, neurologically intact.      COMPLICATIONS: none apparent  CONDITION: stable  [name] MD [facility] Phone Number: [phone] 04/09/2023 10:02 AM

## 2023-04-09 NOTE — Anesthesia Procedure Notes (Signed)
 Arterial Line Insertion Start/End3/05/2023 6:55 AM Performed by: Mariann Barter, MD, Noah Delaine, CRNA, CRNA  Patient location: Pre-op. Preanesthetic checklist: patient identified, IV checked, site marked, risks and benefits discussed, surgical consent, monitors and equipment checked, pre-op evaluation, timeout performed and anesthesia consent Lidocaine 1% used for infiltration Right, radial was placed Catheter size: 20 G Hand hygiene performed  and maximum sterile barriers used   Attempts: 1 Procedure performed without using ultrasound guided technique. Following insertion, dressing applied. Post procedure assessment: normal and unchanged  Patient tolerated the procedure well with no immediate complications.

## 2023-04-09 NOTE — Progress Notes (Addendum)
 Per dayshift RN, DR. Hetty Blend was paged from PACU regarding right facial droop. Right facial droop still present, and lips asymmetrical on the right, but movements present bilaterally.  Very mild expressive aphasia also noted, but this is existing symptom as well.  Will continue to monitor pt closely.  Spoke with Dr. Hetty Blend, and no new intervention at this time.

## 2023-04-09 NOTE — Plan of Care (Signed)
  Problem: Clinical Measurements: Goal: Will remain free from infection Outcome: Progressing   Problem: Clinical Measurements: Goal: Diagnostic test results will improve Outcome: Progressing   Problem: Clinical Measurements: Goal: Respiratory complications will improve Outcome: Progressing   Problem: Clinical Measurements: Goal: Respiratory complications will improve Outcome: Progressing   Problem: Pain Managment: Goal: General experience of comfort will improve and/or be controlled Outcome: Progressing   Problem: Skin Integrity: Goal: Demonstration of wound healing without infection will improve Outcome: Progressing

## 2023-04-09 NOTE — Transfer of Care (Signed)
 Immediate Anesthesia Transfer of Care Note  Patient: Russell Moses.  Procedure(s) Performed: LEFT CAROTID ENDARTERECTOMY WITH PATCH ANGIOPLASTY (Left: Neck) ANGIOPLASTY, USING 1 CM X 6 CM XENOSURE PATCH GRAFT (Left: Neck)  Patient Location: PACU  Anesthesia Type:General  Level of Consciousness: awake, alert , and oriented  Airway & Oxygen Therapy: Patient Spontanous Breathing and Patient connected to face mask oxygen  Post-op Assessment: Report given to RN and Post -op Vital signs reviewed and stable  Post vital signs: Reviewed and stable  Last Vitals:  Vitals Value Taken Time  BP 124/65 04/09/23 1012  Temp    Pulse 60 04/09/23 1014  Resp 13 04/09/23 1013  SpO2 92 % 04/09/23 1014  Vitals shown include unfiled device data.  Last Pain:  Vitals:   04/09/23 0621  TempSrc:   PainSc: 0-No pain      Patients Stated Pain Goal: 0 (04/09/23 3244)  Complications: No notable events documented.

## 2023-04-10 ENCOUNTER — Encounter (HOSPITAL_COMMUNITY): Payer: Self-pay | Admitting: Vascular Surgery

## 2023-04-10 LAB — CBC
HCT: 35.9 % — ABNORMAL LOW (ref 39.0–52.0)
Hemoglobin: 12.8 g/dL — ABNORMAL LOW (ref 13.0–17.0)
MCH: 30.7 pg (ref 26.0–34.0)
MCHC: 35.7 g/dL (ref 30.0–36.0)
MCV: 86.1 fL (ref 80.0–100.0)
Platelets: 142 10*3/uL — ABNORMAL LOW (ref 150–400)
RBC: 4.17 MIL/uL — ABNORMAL LOW (ref 4.22–5.81)
RDW: 13.1 % (ref 11.5–15.5)
WBC: 9.5 10*3/uL (ref 4.0–10.5)
nRBC: 0 % (ref 0.0–0.2)

## 2023-04-10 LAB — BASIC METABOLIC PANEL
Anion gap: 9 (ref 5–15)
BUN: 11 mg/dL (ref 6–20)
CO2: 21 mmol/L — ABNORMAL LOW (ref 22–32)
Calcium: 9 mg/dL (ref 8.9–10.3)
Chloride: 109 mmol/L (ref 98–111)
Creatinine, Ser: 0.85 mg/dL (ref 0.61–1.24)
GFR, Estimated: >60 mL/min (ref >60)
Glucose, Bld: 144 mg/dL — ABNORMAL HIGH (ref 70–99)
Potassium: 4 mmol/L (ref 3.5–5.1)
Sodium: 139 mmol/L (ref 135–145)

## 2023-04-10 MED ORDER — OXYCODONE-ACETAMINOPHEN 5-325 MG PO TABS: 1.0000 | ORAL_TABLET | Freq: Four times a day (QID) | ORAL | 0 refills | Status: DC | PRN

## 2023-04-10 NOTE — Progress Notes (Signed)
 Patient was concerned about his HR, he mentioned that he was said by the MD this morning to monitor his HR today and to discharge him tomorrow, so he wanted to stay tonight.  Informed to the PA via phone call, she mentioned he can stay, if he wants.

## 2023-04-10 NOTE — Discharge Summary (Signed)
 Discharge Summary     Russell Moses. 23-Jan-1971 53 y.o. male  [id number]  Admission Date: 04/09/2023  Discharge Date: 04/11/2023 Physician: [name], MD  Admission Diagnosis: Carotid stenosis [I65.29] Carotid artery stenosis [I65.29]   HPI:   This is a 53 y.o. male  presenting for evaluation of carotid stenosis.  He had a stroke on 03/12/23 with symptoms of word finding difficulties.  He was admitted to [facility] where a left hemispheric stroke was noted and a CTA demonstrated a moderate stenosis of the left carotid bulb. He denies any previous history of stroke or TIA symptoms.  Other than this episode of word finding difficulties he denies any previous one-sided weakness, numbness, or amaurosis.  He reports he continues to have word finding difficulties and says he can read and understanding his head but has trouble putting it to words.  He denies any previous neck surgeries or radiation.  He denies any cardiac history and denies any shortness of breath with activity.  Hospital Course:  The patient was admitted to the hospital and taken to the operating room on 04/09/2023 and underwent left CEA with bovine patch angioplasty.    Findings: -Continuous Doppler audible flow signatures are appropriate for each carotid artery -No evidence of intimal flap visualized on transverse or longitudinal ultrasonography -Calcific and ulcerated carotid plaque  The pt tolerated the procedure well and was transported to the PACU in good condition.   By POD 1, the pt neuro status he did have some bradycardia overnight.  He was not lightheaded or dizzy.  He had voided. 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  Given bradycardia, he was kept an additional day.  POD 2, pt feeling much better.  Heart rate at and near baseline and he is discharged home.    Recent Labs    04/09/23 0545 04/10/23 0515  NA 138 139  K 3.9 4.0  CL 105 109  CO2 24 21*  GLUCOSE 115* 144*  BUN 11 11   CALCIUM 9.8 9.0   Recent Labs    04/09/23 0545 04/10/23 0515  WBC 6.2 9.5  HGB 15.0 12.8*  HCT 43.7 35.9*  PLT 157 142*   Recent Labs    04/09/23 0545  INR 1.0       Discharge Diagnosis:  Carotid stenosis [I65.29] Carotid artery stenosis [I65.29]  Secondary Diagnosis: Patient Active Problem List   Diagnosis Date Noted   Carotid stenosis 04/09/2023   Carotid artery stenosis 04/09/2023   Chest pain 03/18/2019   Vitamin D deficiency 03/18/2019   B12 deficiency 03/18/2019   Right knee pain 09/26/2017   Gastroenteritis 02/26/2017   Hyperglycemia 12/16/2015   Erectile dysfunction 06/15/2015   Hyperlipidemia 06/15/2015   Obesity 06/15/2015   Allergic rhinitis 12/16/2014   Psoriasis 12/15/2013   Ventral hernia without obstruction or gangrene 12/15/2013   Multiple sclerosis (HCC) 10/27/2013   GERD (gastroesophageal reflux disease) 07/30/2013   Lumbosacral radiculopathy at L5 05/14/2013   Other malaise and fatigue 03/26/2013   Fatigue 03/26/2013   Anxiety state 03/04/2013   Hypertension 02/25/2013   Encounter for well adult exam with abnormal findings 02/21/2012   BIPOLAR DISORDER UNSPECIFIED 04/18/2009   Extrinsic asthma 04/18/2009   INSOMNIA 04/18/2009   Past Medical History:  Diagnosis Date   ASTHMA, CHILDHOOD 04/18/2009   Qualifier: Diagnosis of  By: Jonny Ruiz MD, Len Blalock    BIPOLAR DISORDER UNSPECIFIED 04/18/2009   Qualifier: Diagnosis of  By: Jonny Ruiz MD, Len Blalock    GERD (gastroesophageal  reflux disease) 07/30/2013   Hypertension    Insomnia    Multiple sclerosis (HCC) 2015   Psoriasis 12/15/2013   Stroke (HCC)    Ventral hernia without obstruction or gangrene 12/15/2013   Vitamin D deficiency 03/18/2019    Allergies as of 04/10/2023       Reactions   Zyrtec [cetirizine] Other (See Comments)   Fatigued, felt bad, weak   Penicillins Itching, Rash   Little bumps        Medication List     TAKE these medications    amLODipine 10 MG tablet Commonly  known as: NORVASC Take 1 tablet by mouth once daily   aspirin EC 81 MG tablet Take 1 tablet (81 mg total) by mouth daily.   atorvastatin 40 MG tablet Commonly known as: LIPITOR Take 40 mg by mouth at bedtime.   clopidogrel 75 MG tablet Commonly known as: PLAVIX Take 75 mg by mouth at bedtime.   D3 ADULT PO Take 5,000 Units by mouth 2 (two) times a week.   Glatiramer Acetate 40 MG/ML Sosy Inject 40 mg into the skin 3 (three) times a week.   multivitamin with minerals Tabs tablet Take by mouth 2 (two) times a week.   oxyCODONE-acetaminophen 5-325 MG tablet Commonly known as: Percocet Take 1 tablet by mouth every 6 (six) hours as needed for severe pain (pain score 7-10).   telmisartan 80 MG tablet Commonly known as: MICARDIS TAKE 1 TABLET BY MOUTH ONCE DAILY **ANNUAL  APPOINTMENT  DUE  IN  MAY,  MUST  SEE  PROVIDER  FOR  FUTURE  REFILLS**         [facility] Discharge Instructions Carotid Endarterectomy (CEA)  Please refer to the following instructions for your post-procedure care. Your surgeon or physician assistant will discuss any changes with you.  Activity  You are encouraged to walk as much as you can. You can slowly return to normal activities but must avoid strenuous activity and heavy lifting until your doctor tell you it's OK. Avoid activities such as vacuuming or swinging a golf club. You can drive after one week if you are comfortable and you are no longer taking prescription pain medications. It is normal to feel tired for serval weeks after your surgery. It is also normal to have difficulty with sleep habits, eating, and bowel movements after surgery. These will go away with time.  Bathing/Showering  You may shower after you come home. Do not soak in a bathtub, hot tub, or swim until the incision heals completely.  Incision Care  Shower every day. Clean your incision with mild soap and water. Pat the area dry with a clean  towel. You do not need a bandage unless otherwise instructed. Do not apply any ointments or creams to your incision. You may have skin glue on your incision. Do not peel it off. It will come off on its own in about one week. Your incision may feel thickened and raised for several weeks after your surgery. This is normal and the skin will soften over time. For Men Only: It's OK to shave around the incision but do not shave the incision itself for 2 weeks. It is common to have numbness under your chin that could last for several months.  Diet  Resume your normal diet. There are no special food restrictions following this procedure. A low fat/low cholesterol diet is recommended for all patients with vascular disease. In order to heal from your surgery,  it is CRITICAL to get adequate nutrition. Your body requires vitamins, minerals, and protein. Vegetables are the best source of vitamins and minerals. Vegetables also provide the perfect balance of protein. Processed food has little nutritional value, so try to avoid this.  Medications  Resume taking all of your medications unless your doctor or physician assistant tells you not to.  If your incision is causing pain, you may take over-the- counter pain relievers such as acetaminophen (Tylenol). If you were prescribed a stronger pain medication, please be aware these medications can cause nausea and constipation.  Prevent nausea by taking the medication with a snack or meal. Avoid constipation by drinking plenty of fluids and eating foods with a high amount of fiber, such as fruits, vegetables, and grains.  Do not take Tylenol if you are taking prescription pain medications.  Follow Up  Our office will schedule a follow up appointment 2-3 weeks following discharge.  Please call us immediately for any of the following conditions  Increased pain, redness, drainage (pus) from your incision site. Fever of 101 degrees or higher. If you should develop stroke  (slurred speech, difficulty swallowing, weakness on one side of your body, loss of vision) you should call 911 and go to the nearest emergency room.  Reduce your risk of vascular disease:  Stop smoking. If you would like help call [facility] at 1-800-QUIT-NOW (5185444579) or [facility] at 610-373-7647. Manage your cholesterol Maintain a desired weight Control your diabetes Keep your blood pressure down  If you have any questions, please call the office at [phone].  Prescriptions given: 1.   Roxicet #8 No Refill  Disposition: home  Patient's condition: is Good  Follow up: 1. [facility] in 2-3 weeks on Dr. Hetty Blend clinic day   Doreatha Massed, PA-C [facility] [phone]   --- For [facility] Registry use ---   Modified Rankin score at D/C (0-6): 0  IV medication needed for:  1. Hypertension: No 2. Hypotension: No  Post-op Complications: No  1. Post-op CVA or TIA: No  If yes: Event classification (right eye, left eye, right cortical, left cortical, verterobasilar, other): n/a  If yes: Timing of event (intra-op, <6 hrs post-op, >=6 hrs post-op, unknown): n/a  2. CN injury: No  If yes: CN n/a injuried   3. Myocardial infarction: No  If yes: Dx by (EKG or clinical, Troponin): n/a  4.  CHF: No  5.  Dysrhythmia (new): No  6. Wound infection: No  7. Reperfusion symptoms: No  8. Return to OR: No  If yes: return to OR for (bleeding, neurologic, other CEA incision, other): n/a  Discharge medications: Statin use:  Yes ASA use:  Yes   Beta blocker use:  No ACE-Inhibitor use:  No  ARB use:  Yes CCB use: No P2Y12 Antagonist use: Yes, [ ]  Plavix, [ ]  Plasugrel, [ ]  Ticlopinine, [ ]  Ticagrelor, [ ]  Other, [ ]  No for medical reason, [ ]  Non-compliant, [ ]  Not-indicated Anti-coagulant use:  No, [ ]  Warfarin, [ ]  Rivaroxaban, [ ]  Dabigatran,

## 2023-04-10 NOTE — Discharge Instructions (Signed)

## 2023-04-10 NOTE — Plan of Care (Signed)
  Problem: Clinical Measurements: Goal: Will remain free from infection Outcome: Progressing   Problem: Clinical Measurements: Goal: Diagnostic test results will improve Outcome: Progressing   Problem: Pain Managment: Goal: General experience of comfort will improve and/or be controlled Outcome: Progressing   Problem: Safety: Goal: Ability to remain free from injury will improve Outcome: Progressing   Problem: Clinical Measurements: Goal: Postoperative complications will be avoided or minimized Outcome: Progressing   Problem: Skin Integrity: Goal: Demonstration of wound healing without infection will improve Outcome: Progressing

## 2023-04-10 NOTE — Progress Notes (Addendum)
  Progress Note    04/10/2023 6:45 AM 1 Day Post-Op  Subjective:  denies trouble swallowing; hasn't been out of bed.  Has voided.  Denies any lightheadedness or dizziness.   Afebrile HR 30's-50's  120's-150's systolic 94% RA  Gtts:  none   Vitals:   04/10/23 0500 04/10/23 0503  BP: (!) 153/62   Pulse: (!) 38 (!) 43  Resp: 14 15  Temp: 98 F (36.7 C)   SpO2: 94% 94%     Physical Exam: Neuro:  in tact; tongue is midline; smile asymmetrical  Lungs:  non labored Incision:  clean and dry with mild fullness.    CBC    Component Value Date/Time   WBC 9.5 04/10/2023 0515   RBC 4.17 (L) 04/10/2023 0515   HGB 12.8 (L) 04/10/2023 0515   HCT 35.9 (L) 04/10/2023 0515   PLT 142 (L) 04/10/2023 0515   MCV 86.1 04/10/2023 0515   MCH 30.7 04/10/2023 0515   MCHC 35.7 04/10/2023 0515   RDW 13.1 04/10/2023 0515   LYMPHSABS 1.8 06/11/2022 0758   MONOABS 0.4 06/11/2022 0758   EOSABS 0.2 06/11/2022 0758   BASOSABS 0.0 06/11/2022 0758    BMET    Component Value Date/Time   NA 139 04/10/2023 0515   K 4.0 04/10/2023 0515   CL 109 04/10/2023 0515   CO2 21 (L) 04/10/2023 0515   GLUCOSE 144 (H) 04/10/2023 0515   BUN 11 04/10/2023 0515   CREATININE 0.85 04/10/2023 0515   CREATININE 0.80 03/16/2014 1032   CALCIUM 9.0 04/10/2023 0515   GFRNONAA >60 04/10/2023 0515   GFRAA >90 05/26/2013 1230     Intake/Output Summary (Last 24 hours) at 04/10/2023 0645 Last data filed at 04/10/2023 0507 Gross per 24 hour  Intake 2017.08 ml  Output 1495 ml  Net 522.08 ml     Assessment/Plan:  This is a 53 y.o. male who is s/p left CEA  1 Day Post-Op  -pt is doing well this am.  He does have some bradycardia with HR dipping into the 30's and 40's overnight.   Blood pressure is tolerating.   -pt neuro exam is in tact -pt has not ambulated -pt has voided -continue asa/plavix/statin -needs to mobilize.  Most likely will need another day due to bradycardia.  -f/u with [facility] in 2-3 weeks on Dr.  Hetty Blend clinic day.    Doreatha Massed, PA-C [facility] [phone]  VASCULAR STAFF ADDENDUM: I have independently interviewed and examined the patient. I agree with the above.  S/p L CEA, recovering well. 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 Which should improve over time. Plan to ambulate and discharge with f/u as above. Continue aspirin, plavix, statin  [name] MD [facility] Phone Number: [phone] 04/10/2023 8:02 AM

## 2023-04-10 NOTE — Progress Notes (Signed)
 Mobility Specialist Progress Note:   04/10/23 0923  Mobility  Activity Ambulated with assistance in hallway  Level of Assistance Contact guard assist, steadying assist  Assistive Device None  Distance Ambulated (ft) 450 ft  Activity Response Tolerated well  Mobility Referral Yes  Mobility visit 1 Mobility  Mobility Specialist Start Time (ACUTE ONLY) [id number]  Mobility Specialist Stop Time (ACUTE ONLY) 0925  Mobility Specialist Time Calculation (min) (ACUTE ONLY) 7 min   Pt received in bed, agreeable to mobility session. Ambulated in hallway with CGA. Tolerated well, HR 67 bpm, SpO2 97% on RA. Returned pt to room, left with all needs met.   Feliciana Rossetti Mobility Specialist Please contact via Special educational needs teacher or  Rehab office at [phone]

## 2023-04-10 NOTE — Anesthesia Postprocedure Evaluation (Signed)
 Anesthesia Post Note  Patient: Russell Moses.  Procedure(s) Performed: LEFT CAROTID ENDARTERECTOMY WITH PATCH ANGIOPLASTY (Left: Neck) ANGIOPLASTY, USING 1 CM X 6 CM XENOSURE PATCH GRAFT (Left: Neck)     Patient location during evaluation: PACU Anesthesia Type: General Level of consciousness: awake and alert Pain management: pain level controlled Vital Signs Assessment: post-procedure vital signs reviewed and stable Respiratory status: spontaneous breathing, nonlabored ventilation, respiratory function stable and patient connected to nasal cannula oxygen Cardiovascular status: blood pressure returned to baseline and stable Postop Assessment: no apparent nausea or vomiting Anesthetic complications: no   No notable events documented.              Mariann Barter

## 2023-04-11 NOTE — Progress Notes (Addendum)
  Progress Note    04/11/2023 6:49 AM 2 Days Post-Op  Subjective:  says he is glad he stayed another night.  Feels better today.  Feels his mouth is some better.  No difficulty swallowing.  Walked and has voided.   Afebrile HR 30's-40's 120's-150's systolic 92% RA  Vitals:   04/11/23 0035 04/11/23 0338  BP:  127/69  Pulse:  (!) 41  Resp:  12  Temp: 97.6 F (36.4 C) 97.6 F (36.4 C)  SpO2:  94%    Physical Exam: General:  no distress Lungs:  non labored Incisions:  clean and dry   CBC    Component Value Date/Time   WBC 9.5 04/10/2023 0515   RBC 4.17 (L) 04/10/2023 0515   HGB 12.8 (L) 04/10/2023 0515   HCT 35.9 (L) 04/10/2023 0515   PLT 142 (L) 04/10/2023 0515   MCV 86.1 04/10/2023 0515   MCH 30.7 04/10/2023 0515   MCHC 35.7 04/10/2023 0515   RDW 13.1 04/10/2023 0515   LYMPHSABS 1.8 06/11/2022 0758   MONOABS 0.4 06/11/2022 0758   EOSABS 0.2 06/11/2022 0758   BASOSABS 0.0 06/11/2022 0758    BMET    Component Value Date/Time   NA 139 04/10/2023 0515   K 4.0 04/10/2023 0515   CL 109 04/10/2023 0515   CO2 21 (L) 04/10/2023 0515   GLUCOSE 144 (H) 04/10/2023 0515   BUN 11 04/10/2023 0515   CREATININE 0.85 04/10/2023 0515   CREATININE 0.80 03/16/2014 1032   CALCIUM 9.0 04/10/2023 0515   GFRNONAA >60 04/10/2023 0515   GFRAA >90 05/26/2013 1230    INR    Component Value Date/Time   INR 1.0 04/09/2023 0545     Intake/Output Summary (Last 24 hours) at 04/11/2023 0649 Last data filed at 04/10/2023 1859 Gross per 24 hour  Intake 720 ml  Output 750 ml  Net -30 ml      Assessment/Plan:  53 y.o. male is s/p:  Left CEA  2 Days Post-Op   -pt doing well today and feeling better.  HR in the high 40's and 50's while I was in the room.  His initial HR on admit was 51.   -will discharge home today and f/u in 2-3 weeks    Doreatha Massed, PA-C [facility] [phone] 04/11/2023 6:49 AM   VASCULAR STAFF ADDENDUM: I have independently  interviewed and examined the patient. I agree with the above.  Some left marginal mandibular nerve palsy and left jaw tingling and numbness. Explained that this can get better over time. Okay for discharge as above today.  [name] MD [facility] Phone Number: [phone] 04/11/2023 9:27 AM

## 2023-04-11 NOTE — Progress Notes (Signed)
 Discharge instructions (including medications) discussed with and copy provided to patient/caregiver

## 2023-04-11 NOTE — Progress Notes (Signed)
 Mobility Specialist Progress Note:    04/11/23 0843  Mobility  Activity Ambulated independently in hallway  Level of Assistance Standby assist, set-up cues, supervision of patient - no hands on  Assistive Device None  Distance Ambulated (ft) 450 ft  Activity Response Tolerated well  Mobility Referral Yes  Mobility visit 1 Mobility  Mobility Specialist Start Time (ACUTE ONLY) [id number]  Mobility Specialist Stop Time (ACUTE ONLY) 0843  Mobility Specialist Time Calculation (min) (ACUTE ONLY) 4 min   Pt received in bed, agreeable to mobility session. Ambulated independently with no AD, just SV. Tolerated well, Max HR 62 bpm. Returned pt to room, eager for d/c. Left with all needs met.   Feliciana Rossetti Mobility Specialist Please contact via Special educational needs teacher or  Rehab office at [phone]

## 2023-04-11 NOTE — Plan of Care (Signed)
  Problem: Education: Goal: Knowledge of General Education information will improve Description: Including pain rating scale, medication(s)/side effects and non-pharmacologic comfort measures Outcome: Adequate for Discharge   Problem: Health Behavior/Discharge Planning: Goal: Ability to manage health-related needs will improve Outcome: Adequate for Discharge   Problem: Clinical Measurements: Goal: Ability to maintain clinical measurements within normal limits will improve Outcome: Adequate for Discharge Goal: Will remain free from infection Outcome: Adequate for Discharge Goal: Diagnostic test results will improve Outcome: Adequate for Discharge Goal: Respiratory complications will improve Outcome: Adequate for Discharge Goal: Cardiovascular complication will be avoided Outcome: Adequate for Discharge   Problem: Activity: Goal: Risk for activity intolerance will decrease Outcome: Adequate for Discharge   Problem: Nutrition: Goal: Adequate nutrition will be maintained Outcome: Adequate for Discharge   Problem: Coping: Goal: Level of anxiety will decrease Outcome: Adequate for Discharge   Problem: Elimination: Goal: Will not experience complications related to bowel motility Outcome: Adequate for Discharge Goal: Will not experience complications related to urinary retention Outcome: Adequate for Discharge   Problem: Pain Managment: Goal: General experience of comfort will improve and/or be controlled Outcome: Adequate for Discharge   Problem: Safety: Goal: Ability to remain free from injury will improve Outcome: Adequate for Discharge   Problem: Skin Integrity: Goal: Risk for impaired skin integrity will decrease Outcome: Adequate for Discharge   Problem: Education: Goal: Knowledge of discharge needs will improve Outcome: Adequate for Discharge   Problem: Clinical Measurements: Goal: Postoperative complications will be avoided or minimized Outcome: Adequate for  Discharge   Problem: Respiratory: Goal: Will achieve and/or maintain a regular respiratory rate, without signs or symptoms of dyspnea Outcome: Adequate for Discharge   Problem: Skin Integrity: Goal: Demonstration of wound healing without infection will improve Outcome: Adequate for Discharge

## 2023-04-14 ENCOUNTER — Encounter: Payer: Self-pay | Admitting: Internal Medicine

## 2023-04-26 ENCOUNTER — Ambulatory Visit (INDEPENDENT_AMBULATORY_CARE_PROVIDER_SITE_OTHER): Admitting: Physician Assistant

## 2023-04-26 VITALS — BP 136/78 | HR 49 | Temp 98.2°F | Ht 67.0 in | Wt 186.5 lb

## 2023-04-26 DIAGNOSIS — I6522 Occlusion and stenosis of left carotid artery: Secondary | ICD-10-CM

## 2023-04-26 NOTE — Progress Notes (Signed)
  POST OPERATIVE OFFICE NOTE    CC:  F/u for surgery  HPI:  This is a 53 y.o. male who is s/p left carotid endarterectomy by Dr. Hetty Blend on 04/09/2023 due to symptomatic left ICA stenosis.  He believes the left neck incision is healing well.  He denies any further strokelike symptoms since surgery.  He believes his mobility is back to baseline.  He is on aspirin, Plavix, statin daily.  He has a follow-up with neurology next month.  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  He does not perform any heavy lifting at work.  Allergies  Allergen Reactions   Zyrtec [Cetirizine] Other (See Comments)    Fatigued, felt bad, weak   Penicillins Itching and Rash    Little bumps    Current Outpatient Medications  Medication Sig Dispense Refill   amLODipine (NORVASC) 10 MG tablet Take 1 tablet by mouth once daily 90 tablet 3   aspirin EC 81 MG tablet Take 1 tablet (81 mg total) by mouth daily. 90 tablet 11   atorvastatin (LIPITOR) 40 MG tablet Take 40 mg by mouth at bedtime.     Cholecalciferol (D3 ADULT PO) Take 5,000 Units by mouth 2 (two) times a week.     clopidogrel (PLAVIX) 75 MG tablet Take 75 mg by mouth at bedtime.     Multiple Vitamin (MULTIVITAMIN WITH MINERALS) TABS tablet Take by mouth 2 (two) times a week.     telmisartan (MICARDIS) 80 MG tablet TAKE 1 TABLET BY MOUTH ONCE DAILY **ANNUAL  APPOINTMENT  DUE  IN  MAY,  MUST  SEE  PROVIDER  FOR  FUTURE  REFILLS** 90 tablet 3   Glatiramer Acetate 40 MG/ML SOSY Inject 40 mg into the skin 3 (three) times a week. (Patient not taking: Reported on 04/26/2023) 12 mL 11   oxyCODONE-acetaminophen (PERCOCET) 5-325 MG tablet Take 1 tablet by mouth every 6 (six) hours as needed for severe pain (pain score 7-10). (Patient not taking: Reported on 04/26/2023) 8 tablet 0   No current facility-administered medications for this visit.     ROS:  See HPI  Physical Exam:  Vitals:   04/26/23 1049 04/26/23 1052  BP: (!) 149/94  136/78  Pulse: (!) 49   Temp: 98.2 F (36.8 C)   TempSrc: Temporal   SpO2: 96%   Weight: 186 lb 8 oz (84.6 kg)   Height: 5\' 7"  (1.702 m)     Incision: Left neck incision healing well with palpable healing ridge but no firm hematoma Extremities:  moving all ext wel  Neuro: A&O; CN grossly intact   Assessment/Plan:  This is a 53 y.o. male who is s/p: Left carotid endarterectomy for symptomatic left ICA stenosis  Subjectively, he has not had any further strokelike symptoms since surgery.  Left neck incision is healing well.  Contralateral carotid with less than 50% stenosis on preoperative imaging.  Continue aspirin, Plavix, statin.  Patient will discuss necessity of Plavix with neurology next month.  Okay to return to work without restriction.  We will check a carotid duplex in 9 months.   Emilie Rutter, PA-C [facility] [phone]  Clinic MD:  Randie Heinz on call

## 2023-04-29 ENCOUNTER — Encounter: Payer: Self-pay | Admitting: Neurology

## 2023-04-29 ENCOUNTER — Encounter: Payer: Self-pay | Admitting: Physician Assistant

## 2023-05-01 ENCOUNTER — Encounter: Payer: Self-pay | Admitting: Vascular Surgery

## 2023-05-01 ENCOUNTER — Telehealth: Payer: Self-pay

## 2023-05-01 NOTE — Telephone Encounter (Signed)
 Return to Work: Form completed and faxed to [place] and pt home fax # per request

## 2023-05-01 NOTE — Telephone Encounter (Signed)
 Called patient to get clarification on what he is needing as his Mychart message was a bit unclear. Patient stated that he was wanting to know if any part of his Aphasia could be coming from his MS? Or is the Aphasia connected to MS in anyway? Patient stated he does understand he had a stroke, but was curious if his MS is contributing to this.   Patient also stated that he wanted to let Dr. Allena Katz know that he is very happy with the Vascular doctor he has been referred to by Dr. Allena Katz.

## 2023-05-03 ENCOUNTER — Encounter: Payer: Self-pay | Admitting: Internal Medicine

## 2023-05-06 ENCOUNTER — Encounter: Payer: Self-pay | Admitting: Podiatry

## 2023-05-06 ENCOUNTER — Ambulatory Visit: Admitting: Podiatry

## 2023-05-06 ENCOUNTER — Encounter: Payer: Self-pay | Admitting: Vascular Surgery

## 2023-05-06 ENCOUNTER — Ambulatory Visit (INDEPENDENT_AMBULATORY_CARE_PROVIDER_SITE_OTHER)

## 2023-05-06 DIAGNOSIS — D2371 Other benign neoplasm of skin of right lower limb, including hip: Secondary | ICD-10-CM

## 2023-05-06 DIAGNOSIS — Q828 Other specified congenital malformations of skin: Secondary | ICD-10-CM

## 2023-05-06 DIAGNOSIS — Q6671 Congenital pes cavus, right foot: Secondary | ICD-10-CM

## 2023-05-06 DIAGNOSIS — Q6672 Congenital pes cavus, left foot: Secondary | ICD-10-CM

## 2023-05-06 NOTE — Progress Notes (Unsigned)
 Chief Complaint  Patient presents with   Callouses    Right foot on the ball of the foot are 2 corns. He had them treated in 2023, and has been using otc corn pads since. Not diabetic and takes ASA 81 and Plavix    HPI: 53 y.o. male presents today with painful skin lesions under the right forefoot subsecond subfourth metatarsal head region.  He has previously had these treated.  He has been using over-the-counter corn pads.  He has difficulty maintaining himself.  Past Medical History:  Diagnosis Date   ASTHMA, CHILDHOOD 04/18/2009   Qualifier: Diagnosis of  By: Jonny Ruiz MD, Len Blalock    BIPOLAR DISORDER UNSPECIFIED 04/18/2009   Qualifier: Diagnosis of  By: Jonny Ruiz MD, Len Blalock    GERD (gastroesophageal reflux disease) 07/30/2013   Hypertension    Insomnia    Multiple sclerosis (HCC) 2015   Psoriasis 12/15/2013   Stroke (HCC)    Ventral hernia without obstruction or gangrene 12/15/2013   Vitamin D deficiency 03/18/2019    Past Surgical History:  Procedure Laterality Date   CHOLECYSTECTOMY  09/2020   ENDARTERECTOMY Left 04/09/2023   Procedure: LEFT CAROTID ENDARTERECTOMY WITH PATCH ANGIOPLASTY;  Surgeon: [name], MD;  Location: [facility] OR;  Service: Vascular;  Laterality: Left;   HERNIA REPAIR     x2   PATCH ANGIOPLASTY Left 04/09/2023   Procedure: ANGIOPLASTY, USING 1 CM X 6 CM XENOSURE PATCH GRAFT;  Surgeon: [name], MD;  Location: [facility] OR;  Service: Vascular;  Laterality: Left;    Allergies  Allergen Reactions   Zyrtec [Cetirizine] Other (See Comments)    Fatigued, felt bad, weak   Penicillins Itching and Rash    Little bumps    ROS denies any nausea, vomiting, fever, chills, chest pain, shortness of breath   Physical Exam: There were no vitals filed for this visit.  General: The patient is alert and oriented x3 in no acute distress.  Dermatology: Skin is warm, dry and supple bilateral lower extremities. Interspaces are clear of maceration and debris.   Painful hyperkeratotic skin lesions with nucleated cores present near the second and fourth metatarsal head right foot.  They are painful on direct palpation.  Vascular: Palpable pedal pulses bilaterally. Capillary refill within normal limits.  No appreciable edema.  No erythema or calor.  Neurological: Light touch sensation grossly intact bilateral feet.   Musculoskeletal Exam: Manual muscle strength testing 5/5 in dorsiflexion, plantarflexion, inversion, eversion of both feet.  Decreased ankle joint dorsiflexion range of motion noted.  High arched foot type.  Radiographic Exam: Right foot 3 views weightbearing 05/06/2023 Normal osseous mineralization. Joint spaces preserved.  No fractures or osseous irregularities noted.  High arched foot type noted.  Bunion deformity noted.  Met adductus noted.  Assessment/Plan of Care: 1. Benign neoplasm of skin of right foot   2. Pes cavus of both feet      No orders of the defined types were placed in this encounter.  None  Discussed clinical findings with patient today.  Painful skin lesions x 2 right foot under the fourth metatarsal head and second metatarsal head region were safely debrided with a sterile #312 blade to patient's level of comfort without incident.  Salinocaine cream was applied to the lesions which were then bandaged.  Offloading felt pad applied.  We discussed preventative and palliative care of these lesions including supportive and accommodative shoegear, padding, prefabricated and custom molded accommodative orthoses, use of  a pumice stone and lotions/creams daily.    Srihari Shellhammer L. Marchia Bond, AACFAS [facility]     [address] [place], [place] [address]                Office [phone]  Fax [phone]

## 2023-05-06 NOTE — Patient Instructions (Signed)
 More silicone and felt pads can be purchased from:  [email or web address]  Look for metatarsal pads, horseshoe pads, callus pads  Look for urea 40% cream or ointment and apply to the thickened dry skin / calluses. This can be bought over the counter, at a pharmacy or online such as [employer].

## 2023-05-08 ENCOUNTER — Encounter: Payer: Self-pay | Admitting: Internal Medicine

## 2023-05-13 ENCOUNTER — Encounter: Payer: Self-pay | Admitting: Internal Medicine

## 2023-05-13 ENCOUNTER — Ambulatory Visit: Admitting: Internal Medicine

## 2023-05-13 VITALS — BP 126/78 | HR 47 | Temp 98.0°F | Ht 67.0 in | Wt 189.0 lb

## 2023-05-13 DIAGNOSIS — I639 Cerebral infarction, unspecified: Secondary | ICD-10-CM

## 2023-05-13 DIAGNOSIS — E559 Vitamin D deficiency, unspecified: Secondary | ICD-10-CM | POA: Diagnosis not present

## 2023-05-13 DIAGNOSIS — T50901A Poisoning by unspecified drugs, medicaments and biological substances, accidental (unintentional), initial encounter: Secondary | ICD-10-CM

## 2023-05-13 DIAGNOSIS — R519 Headache, unspecified: Secondary | ICD-10-CM | POA: Insufficient documentation

## 2023-05-13 DIAGNOSIS — R739 Hyperglycemia, unspecified: Secondary | ICD-10-CM

## 2023-05-13 DIAGNOSIS — R001 Bradycardia, unspecified: Secondary | ICD-10-CM | POA: Diagnosis not present

## 2023-05-13 DIAGNOSIS — I160 Hypertensive urgency: Secondary | ICD-10-CM | POA: Insufficient documentation

## 2023-05-13 DIAGNOSIS — E7800 Pure hypercholesterolemia, unspecified: Secondary | ICD-10-CM

## 2023-05-13 DIAGNOSIS — I10 Essential (primary) hypertension: Secondary | ICD-10-CM | POA: Diagnosis not present

## 2023-05-13 DIAGNOSIS — E538 Deficiency of other specified B group vitamins: Secondary | ICD-10-CM

## 2023-05-13 HISTORY — DX: Cerebral infarction, unspecified: I63.9

## 2023-05-13 HISTORY — DX: Poisoning by unspecified drugs, medicaments and biological substances, accidental (unintentional), initial encounter: T50.901A

## 2023-05-13 MED ORDER — TELMISARTAN 80 MG PO TABS: 40.0000 mg | ORAL_TABLET | Freq: Every day | ORAL | Status: AC

## 2023-05-13 NOTE — Progress Notes (Unsigned)
 Patient ID: Russell Moses., male   DOB: Nov 04, 1970, 53 y.o.   MRN: [id number]        Chief Complaint: follow up bradycardia, htn, hyperglycemia, s/p CEA mar 2025        HPI:  Russell Moses. is a 53 y.o. male here overall doing ok, check BP and HR frequently at home , HR often in 50's but not lower, BP has been low normal at times as well, and often lightheaded he did not have with the micardis 40 mg - asks to go back to that dose.  Pt denies chest pain, increased sob or doe, wheezing, orthopnea, PND, increased LE swelling, palpitations, or syncope.   Pt denies polydipsia, polyuria, or new focal neuro s/s.    Pt denies fever, wt loss, night sweats, loss of appetite, or other constitutional symptoms  now s/p recent left CEA mar 2025 after CVA.  Prefers to take only liquids as meds but no dysphagia. Wt Readings from Last 3 Encounters:  05/13/23 189 lb (85.7 kg)  04/26/23 186 lb 8 oz (84.6 kg)  04/09/23 185 lb (83.9 kg)   BP Readings from Last 3 Encounters:  05/13/23 126/78  04/26/23 136/78  04/11/23 128/63         Past Medical History:  Diagnosis Date   ASTHMA, CHILDHOOD 04/18/2009   Qualifier: Diagnosis of  By: Jonny Ruiz MD, Len Blalock    BIPOLAR DISORDER UNSPECIFIED 04/18/2009   Qualifier: Diagnosis of  By: Jonny Ruiz MD, Len Blalock    GERD (gastroesophageal reflux disease) 07/30/2013   Hypertension    Insomnia    Multiple sclerosis (HCC) 2015   Psoriasis 12/15/2013   Stroke (HCC)    Ventral hernia without obstruction or gangrene 12/15/2013   Vitamin D deficiency 03/18/2019   Past Surgical History:  Procedure Laterality Date   CHOLECYSTECTOMY  09/2020   ENDARTERECTOMY Left 04/09/2023   Procedure: LEFT CAROTID ENDARTERECTOMY WITH PATCH ANGIOPLASTY;  Surgeon: [name], MD;  Location: [facility] OR;  Service: Vascular;  Laterality: Left;   HERNIA REPAIR     x2   PATCH ANGIOPLASTY Left 04/09/2023   Procedure: ANGIOPLASTY, USING 1 CM X 6 CM XENOSURE PATCH GRAFT;  Surgeon: [name], MD;   Location: [facility] OR;  Service: Vascular;  Laterality: Left;    reports that he has never smoked. He has never used smokeless tobacco. He reports that he does not drink alcohol and does not use drugs. family history includes COPD in his sister; Diabetes in his father; Fibromyalgia in his sister; Healthy in his sister; Heart disease in his father; Hypertension in his father and mother; Parkinson's disease in his father; Stroke in his father. Allergies  Allergen Reactions   Zyrtec [Cetirizine] Other (See Comments)    Fatigued, felt bad, weak   Penicillins Itching and Rash    Little bumps   Current Outpatient Medications on File Prior to Visit  Medication Sig Dispense Refill   amLODipine (NORVASC) 10 MG tablet Take 1 tablet by mouth once daily 90 tablet 3   aspirin EC 81 MG tablet Take 1 tablet (81 mg total) by mouth daily. 90 tablet 11   atorvastatin (LIPITOR) 40 MG tablet Take 40 mg by mouth at bedtime.     Cholecalciferol (D3 ADULT PO) Take 5,000 Units by mouth 2 (two) times a week.     clopidogrel (PLAVIX) 75 MG tablet Take 75 mg by mouth at bedtime.     Glatiramer Acetate 40 MG/ML SOSY Inject 40 mg into  the skin 3 (three) times a week. 12 mL 11   Multiple Vitamin (MULTIVITAMIN WITH MINERALS) TABS tablet Take by mouth 2 (two) times a week.     oxyCODONE-acetaminophen (PERCOCET) 5-325 MG tablet Take 1 tablet by mouth every 6 (six) hours as needed for severe pain (pain score 7-10). 8 tablet 0   No current facility-administered medications on file prior to visit.        ROS:  All others reviewed and negative.  Objective        PE:  BP 126/78 (BP Location: Right Arm, Patient Position: Sitting, Cuff Size: Normal)   Pulse (!) 47   Temp 98 F (36.7 C) (Oral)   Ht 5\' 7"  (1.702 m)   Wt 189 lb (85.7 kg)   SpO2 96%   BMI 29.60 kg/m                 Constitutional: Pt appears in NAD               HENT: Head: NCAT.                Right Ear: External ear normal.                 Left Ear:  External ear normal.                Eyes: . Pupils are equal, round, and reactive to light. Conjunctivae and EOM are normal               Nose: without d/c or deformity               Neck: Neck supple. Gross normal ROM               Cardiovascular: Normal rate and regular rhythm.                 Pulmonary/Chest: Effort normal and breath sounds without rales or wheezing.                Abd:  Soft, NT, ND, + BS, no organomegaly               Neurological: Pt is alert. At baseline orientation, motor grossly intact               Skin: Skin is warm. No rashes, no other new lesions, LE edema - none, healing left CEA scar               Psychiatric: Pt behavior is normal without agitation   Micro: none  Cardiac tracings I have personally interpreted today:  none  Pertinent Radiological findings (summarize): none   Lab Results  Component Value Date   WBC 9.5 04/10/2023   HGB 12.8 (L) 04/10/2023   HCT 35.9 (L) 04/10/2023   PLT 142 (L) 04/10/2023   GLUCOSE 144 (H) 04/10/2023   CHOL 138 12/10/2022   TRIG 126.0 12/10/2022   HDL 31.90 (L) 12/10/2022   LDLCALC 80 12/10/2022   ALT 50 (H) 04/09/2023   AST 31 04/09/2023   NA 139 04/10/2023   K 4.0 04/10/2023   CL 109 04/10/2023   CREATININE 0.85 04/10/2023   BUN 11 04/10/2023   CO2 21 (L) 04/10/2023   TSH 1.30 06/11/2022   PSA 0.36 06/11/2022   INR 1.0 04/09/2023   HGBA1C 5.8 12/10/2022   Assessment/Plan:  Russell Araceli Coufal. is a 53 y.o. White or Caucasian [1] male with  has a past medical history of  ASTHMA, CHILDHOOD (04/18/2009), BIPOLAR DISORDER UNSPECIFIED (04/18/2009), GERD (gastroesophageal reflux disease) (07/30/2013), Hypertension, Insomnia, Multiple sclerosis (HCC) (2015), Psoriasis (12/15/2013), Stroke (HCC), Ventral hernia without obstruction or gangrene (12/15/2013), and Vitamin D deficiency (03/18/2019).  Hypertension BP Readings from Last 3 Encounters:  05/13/23 126/78  04/26/23 136/78  04/11/23 128/63   Possibly  overcontrolled per pt, pt to continue medical treatment amlodipine 10 every day but reduce micardis 40 mg qd   Hyperlipidemia Lab Results  Component Value Date   LDLCALC 80 12/10/2022   Uncontrolled , pt to continue current statin lipitor 40 mg with f/u lipids   Hyperglycemia Lab Results  Component Value Date   HGBA1C 5.8 12/10/2022   Stable, pt to continue current medical treatment  - diet, wt control   Vitamin D deficiency Last vitamin D Lab Results  Component Value Date   VD25OH 37.35 12/10/2022   Low, to start oral replacement   B12 deficiency Lab Results  Component Value Date   VITAMINB12 383 12/10/2022   Stable, cont oral replacement - b12 1000 mcg qd   Bradycardia Asympt, declines ECG, pt is not on negative chronotrope,  to f/u any worsening symptoms or concerns  Followup: Return in about 4 weeks (around 06/10/2023).  Oliver Barre, MD 05/15/2023 8:52 PM [facility]

## 2023-05-13 NOTE — Patient Instructions (Signed)
 Ok to try decreased micardis to 40 mg (just take half of the 80 mg)  Please continue all other medications as before, and refills have been done if requested.  Please have the pharmacy call with any other refills you may need.  Please keep your appointments with your specialists as you may have planned  Please make an Appointment to return in 1 months, or sooner if needed, also with Lab Appointment for testing done 3-5 days before at the FIRST FLOOR Lab (so this is for TWO appointments - please see the scheduling desk as you leave)

## 2023-05-15 ENCOUNTER — Encounter: Payer: Self-pay | Admitting: Internal Medicine

## 2023-05-15 DIAGNOSIS — R001 Bradycardia, unspecified: Secondary | ICD-10-CM | POA: Insufficient documentation

## 2023-05-15 NOTE — Assessment & Plan Note (Signed)
 BP Readings from Last 3 Encounters:  05/13/23 126/78  04/26/23 136/78  04/11/23 128/63   Possibly overcontrolled per pt, pt to continue medical treatment amlodipine 10 every day but reduce micardis 40 mg qd

## 2023-05-15 NOTE — Assessment & Plan Note (Signed)
 Asympt, declines ECG, pt is not on negative chronotrope,  to f/u any worsening symptoms or concerns

## 2023-05-15 NOTE — Assessment & Plan Note (Signed)
 Lab Results  Component Value Date   HGBA1C 5.8 12/10/2022   Stable, pt to continue current medical treatment  - diet,wt control

## 2023-05-15 NOTE — Assessment & Plan Note (Signed)
 Last vitamin D Lab Results  Component Value Date   VD25OH 37.35 12/10/2022   Low, to start oral replacement

## 2023-05-15 NOTE — Assessment & Plan Note (Signed)
 Lab Results  Component Value Date   LDLCALC 80 12/10/2022   Uncontrolled , pt to continue current statin lipitor 40 mg with f/u lipids

## 2023-05-15 NOTE — Assessment & Plan Note (Signed)
 Lab Results  Component Value Date   VITAMINB12 383 12/10/2022   Stable, cont oral replacement - b12 1000 mcg qd

## 2023-05-17 ENCOUNTER — Other Ambulatory Visit: Payer: Self-pay | Admitting: Internal Medicine

## 2023-05-17 MED ORDER — ATORVASTATIN CALCIUM 40 MG PO TABS: 40.0000 mg | ORAL_TABLET | Freq: Every day | ORAL | 3 refills | Status: AC

## 2023-05-17 NOTE — Telephone Encounter (Signed)
 Copied from CRM [id number]. Topic: Clinical - Medication Refill >> May 17, 2023  3:46 PM Fredrich Romans wrote: Most Recent Primary Care Visit:  Provider: Corwin Levins  Department: [facility]  Visit Type: OFFICE VISIT  Date: 05/13/2023  Medication: atorvastatin (LIPITOR) 40 MG tablet  Has the patient contacted their pharmacy? Yes (Agent: If no, request that the patient contact the pharmacy for the refill. If patient does not wish to contact the pharmacy document the reason why and proceed with request.) (Agent: If yes, when and what did the pharmacy advise?)  Is this the correct pharmacy for this prescription? Yes If no, delete pharmacy and type the correct one.  This is the patient's preferred pharmacy:  [facility] [address], [place] - [address] [facility] [place] [address] Phone: [phone] Fax: [phone]    Has the prescription been filled recently? Yes  Is the patient out of the medication? Yes  Has the patient been seen for an appointment in the last year OR does the patient have an upcoming appointment? Yes  Can we respond through MyChart? Yes  Agent: Please be advised that Rx refills may take up to 3 business days. We ask that you follow-up with your pharmacy.

## 2023-05-22 ENCOUNTER — Encounter: Payer: Self-pay | Admitting: Family Medicine

## 2023-05-22 ENCOUNTER — Telehealth: Payer: Self-pay

## 2023-05-22 NOTE — Telephone Encounter (Signed)
 Copied from CRM [id number]. Topic: Clinical - Request for Lab/Test Order >> May 21, 2023  5:20 PM Russell Moses wrote: Reason for CRM: Patient would like to know if the office would like him to complete a Lab appointment before his 06/24/23 Dr appointment with the transfer of care Patient asking to be proactive  Patients call back number is [phone] (M)

## 2023-05-23 NOTE — Telephone Encounter (Signed)
 I spoke with the patient. I have moved the appointment up with Dr. Sirivol for 06/10/2023.

## 2023-06-10 ENCOUNTER — Ambulatory Visit (INDEPENDENT_AMBULATORY_CARE_PROVIDER_SITE_OTHER)

## 2023-06-10 ENCOUNTER — Ambulatory Visit (INDEPENDENT_AMBULATORY_CARE_PROVIDER_SITE_OTHER): Payer: BC Managed Care – PPO | Admitting: Neurology

## 2023-06-10 ENCOUNTER — Encounter: Payer: Self-pay | Admitting: Neurology

## 2023-06-10 VITALS — BP 118/76 | HR 60 | Temp 97.6°F | Ht 67.5 in | Wt 189.0 lb

## 2023-06-10 VITALS — BP 142/74 | HR 60 | Resp 18 | Ht 67.5 in | Wt 189.0 lb

## 2023-06-10 DIAGNOSIS — I10 Essential (primary) hypertension: Secondary | ICD-10-CM

## 2023-06-10 DIAGNOSIS — R4701 Aphasia: Secondary | ICD-10-CM | POA: Diagnosis not present

## 2023-06-10 DIAGNOSIS — G35 Multiple sclerosis: Secondary | ICD-10-CM

## 2023-06-10 DIAGNOSIS — E785 Hyperlipidemia, unspecified: Secondary | ICD-10-CM

## 2023-06-10 DIAGNOSIS — I639 Cerebral infarction, unspecified: Secondary | ICD-10-CM | POA: Diagnosis not present

## 2023-06-10 DIAGNOSIS — K219 Gastro-esophageal reflux disease without esophagitis: Secondary | ICD-10-CM

## 2023-06-10 MED ORDER — CLOPIDOGREL BISULFATE 75 MG PO TABS: 75.0000 mg | ORAL_TABLET | Freq: Every day | ORAL | 1 refills | Status: DC

## 2023-06-10 NOTE — Assessment & Plan Note (Signed)
 Hyperlipidemia managed with atorvastatin  40 mg daily. Recent cholesterol panel showed low LDL and HDL levels. No immediate concerns.

## 2023-06-10 NOTE — Assessment & Plan Note (Addendum)
 Relapsing-remitting multiple sclerosis Diagnosed in 2015. 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 Considering alternative treatments such as dimethyl fumarate. No recent exacerbations reported. - Consider alternative MS treatment options with neurologist    . Discussed general health maintenance, including diet and exercise. Prefers to manage health through diet and lifestyle changes rather than medications. - Encourage healthy diet with fruits and vegetables - Advise regular exercise - Avoid excessive calories and alcohol

## 2023-06-10 NOTE — Progress Notes (Signed)
 Follow-up Visit   Date: 06/10/23    Russell Moses. MRN: [id number] DOB: 1971/01/11   Interim History: Russell Tryce Genaw. is a 53 y.o. right-handed Caucasian male with history of hypertension, bipolar disorder, asthma, herpetic zoster, and anxiety returning for follow-up of LMCA stroke and multiple sclerosis.  The patient was accompanied to the clinic by mother.  History of present illness: In 2014, he developed a tingling sensation over his head and get "swimming headed", which lasted a few seconds and spontaneously resolved. Starting in February 2015, he developed tremors of the hands and bilateral leg weakness. MRI brain in April 2015 showed white matter changes concerning for demyelinating disease.  CSF testing was also supportive of MS.  He was treated with 3-days of IVMP in May 2015 which significantly improved symptoms.  He started copaxone  on 10/2013 and has been tolerating it well, besides injection site lipodystrophy.  Annual surveillance imaging has shown stable disease burden and he has not had any relapses since initial diagnosis in Spring 2015.  UPDATE 06/18/2022:  He is doing well. He continues to be compliant with glatiramer  acetate injections but has noticed increased lipodystrophy, so woud like to explore other options. He is taking L-threonine supplement which helps him feel calm and has improved his sleep.  MRI brain is stable without progression.  He denies any new neurological symptoms.   UPDATE 12/10/2022:  He is here for follow-up visit.  He reports having infection of the abdomen from injection site.  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  He is undecided about medication because he is trying to find the right balance of amino acids.  No new neurological complaints.  He is uptodate with eye exam.   UPDATE 03/19/2023:  He is here for sooner than scheduled follow-up due to recent hospitalization.  He developed acute onset of  word-finding difficulty as well as trying to get words out to speak on 2/4, which prompted him to go to [facility] ER.  He was also having a mild headache.  He recalls being able to read, but unable to express the words.  No associated headache, numbness/tingling.  He had MRI brain and was told he had a "mild stroke" and started on plavix  75mg  and atorvastatin  40mg .  I do not have any records to review.  He continues to have difficulty with naming and getting words out.  No weakness, numbness/tingling, or vision changes.    UPDATE 06/10/2023:  He is here for follow-up visit.  He underwent left CEA in March and doing well.  He completed speech therapy which has helped.  He continues to have some word-finding difficulty, but is able to recall the word better now.  No new neurological symptoms.  He is compliant with aspirin , plavix , and statin therapy.  He has not taken his glatiramer  acetate 40mg  SQ injection since February.  He will be returning home tomorrow.    Medications:  Current Outpatient Medications on File Prior to Visit  Medication Sig Dispense Refill   amLODipine  (NORVASC ) 10 MG tablet Take 1 tablet by mouth once daily 90 tablet 3   aspirin  EC 81 MG tablet Take 1 tablet (81 mg total) by mouth daily. 90 tablet 11   atorvastatin  (LIPITOR) 40 MG tablet Take 1 tablet (40 mg total) by mouth at bedtime. 90 tablet 3   Cholecalciferol (D3 ADULT PO) Take 5,000 Units by mouth 2 (two) times a week.     clopidogrel  (PLAVIX )  75 MG tablet Take 75 mg by mouth at bedtime.     Multiple Vitamin (MULTIVITAMIN WITH MINERALS) TABS tablet Take by mouth 2 (two) times a week.     telmisartan  (MICARDIS ) 80 MG tablet Take 0.5 tablets (40 mg total) by mouth daily.     Glatiramer  Acetate 40 MG/ML SOSY Inject 40 mg into the skin 3 (three) times a week. (Patient not taking: Reported on 06/10/2023) 12 mL 11   oxyCODONE -acetaminophen  (PERCOCET) 5-325 MG tablet Take 1 tablet by mouth every 6 (six) hours as needed for  severe pain (pain score 7-10). (Patient not taking: Reported on 06/10/2023) 8 tablet 0   No current facility-administered medications on file prior to visit.    Allergies:  Allergies  Allergen Reactions   Zyrtec  [Cetirizine ] Other (See Comments)    Fatigued, felt bad, weak   Penicillins Itching and Rash    Little bumps     Vital Signs:  BP (!) 161/80   Pulse 60   Resp 18   Ht 5' 7.5" (1.715 m)   Wt 189 lb (85.7 kg)   SpO2 97%   BMI 29.16 kg/m    Neurological Exam: MENTAL STATUS including orientation to time, place, person, recent and remote memory, attention span and concentration, language, and fund of knowledge is normal.  Speech is not dysarthric.  Repetition is intact.  Naming is mostly intact (improved).  CRANIAL NERVES:  Pupils equal round and reactive to light.  Normal conjugate, extra-ocular eye movements in all directions of gaze.  No ptosis. Normal facial sensation.  Face is symmetric. Palate elevates symmetrically.  Tongue is midline.  MOTOR:  Motor strength is 5/5 in all extremities.  No atrophy, fasciculations or abnormal movements.  No pronator drift.  Tone is normal.    MSRs:  Reflexes are 2+/4 throughout.  SENSORY:  Intact to vibration throughout.  COORDINATION/GAIT:   Intact rapid alternating movements bilaterally.  Gait narrow based and stable.    Data: MRI cervical spine wwo contrast 05/09/2022: Motion degraded examination. No evidence of demyelinating disease in the cervical spinal cord.  MRI brain wwo contrast 05/09/2022: Unchanged cerebral white matter disease consistent with multiple sclerosis. No evidence of active demyelination.  MRI brain wwo contrast 05/29/2021:  No cervical cord lesions.  MRI cervical spine wo contrast 05/29/2021:  Stable burden of cerebral white matter disease compatible with history of multiple sclerosis. No abnormal enhancement.  MRI brain wwo contrast 01/04/2020:  No change  MRI brain wwo contrast 11/03/2018:  No new  lesions compared with December 02, 2017.  No lesions with acute features.   MRI brain wwo contrast 11/28/2016:   Essentially stable findings of multiple sclerosis. No acute intracranial abnormality.  MRI brain wwo contrast 11/17/2015:  No significant changes since 12/16/2014.  No abnormal enhancement.  MRI brain wwo contrast 12/16/2014: 1.  Unchanged supratentorial white matter lesions.  No definite new lesions.  No abnormal enhancement or restricted diffusion. 2.  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 3.  Paransal sinus inflammatory change, correlate clinically.  MRI brain wwo contrast 12/25/2013:  Multiple lesoins in the periventricular and juxtacortical white matter regions bilaterally.  There is no longer enhancement or diffusion abnormality.  No new lesions MRI cervical spine wwo contrast:  Normal  MRI brain wwo contrast 05/28/2013:  Multiple lesions in the periventricular and juxtacortical white matter bilaterally, one of which enhances.  The pattern and distribution of these lesions suggest  demyelinating disease such as multiple sclerosis.  MRI cervical spine wwo contrast 06/04/2013:  Normal MRI cervical spine   EMG right upper and lower extremity 05/14/2013:  There is electrophysiological evidence of an intraspinal canal lesion (i.e. radiculopathy) affecting the L5 nerve root/segment bilaterally. Overall, these changes are mild in degree electrically and worse on the right side. There is no evidence of a generalized sensorimotor polyneuropathy, cervical radiculopathy or diffuse myopathy.   CSF 06/22/2013: R0 W0 G61 P27  ACE 4, Lyme neg, MBP 61, IgG 1.6 OCB The patient's CSF contains >5 well defined gamma restriction bands that are also present in the patient's  corresponding serum sample, but some bands in the CSF are more prominent.  CSF cytology with few mononuclear cells  Lab Results  Component Value Date    TSH 1.30 06/11/2022   Lab Results  Component Value Date   VITAMINB12 383 12/10/2022   [facility] records reviewed and summarized below: CTA head and neck:  Large evolving infarct in the left posterior MCA territory. No mass effect or hemorrhagic changes. No large vessel occlusion. Atherosclerotic plaques along the carotid bulb with moderate 50-70% stenosis of the left ICA origin and mild > 50% at the right ICA origin.  Multifocal atherosclerotic changes including inferior M2 division of the left MCA and right P2 PCA. MRI brain 03/14/2023: moderate sized acute to subacute infarct involving the left posterior MCA territory EKG:  NSR TTE with bubble 03/15/2023:  EF 50-60%, left ventricular diastolic function has an impaired relaxation, trace AR, no PFO Lipid panel:  LDL 105, TG 98, Chol 160, HDL 35 HBA1c 5.7  IMPRESSION/PLAN: Left posterior MCA territory stroke manifesting with expressive aphasia (03/2023), minimal residual word-finding difficulty.  Etiology:  Left carotid stenosis s/p CEA in March 2025.  He has intracranial stenosis including inferior left M2 MCA and right P2 PCA.  - He has completed 3 months of dual antiplatelet therapy, it would be reasonable to continue him on monotherapy now.  Since stroke occurred while taking aspirin , I recommend plavix  75mg  daily.  I will check with vascular surgery to be sure would be okay from their standpoint.  - Continue atorvastatin  40mg  daily.  Relapsing-remitting multiple sclerosis, diagnosed 2015.  He would like to consider alternative options to glatiramer  acetate 40mg  SQ due to lipodystrophy.  He will look into dimethyl fumerate and let me know.   Continue vitamin D  supplements.   Return to clinic in 3 months    Thank you for allowing me to participate in patient's care.  If I can answer any additional questions, I would be pleased to do so.    Sincerely,    Russell Mangiapane K. Lydia Sams, DO

## 2023-06-10 NOTE — Patient Instructions (Signed)
  VISIT SUMMARY: You had a follow-up visit after your carotid endarterectomy. We discussed your recovery, ongoing management of multiple sclerosis, and other health concerns. Your blood pressure and cholesterol levels are well-managed, and you are maintaining your vitamin D  levels through supplements.  YOUR PLAN: RELAPSING-REMITTING MULTIPLE SCLEROSIS: You have multiple sclerosis, diagnosed in 2015, and were previously on glatiramer  acetate injections, which you stopped due to side effects. -Consider alternative treatments such as dimethyl fumarate. Please discuss these options with your neurologist.  EXPRESSIVE APHASIA POST-STROKE: You experienced expressive aphasia following a stroke in February 2025. You have completed speech therapy and have minimal residual speech difficulties. -No new treatment needed at this time.  LEFT CAROTID STENOSIS WITH ENDARTERECTOMY: You had surgery in March 2025 to treat left carotid stenosis and completed three months of dual antiplatelet therapy. -Continue taking Plavix  75 mg daily. -We are awaiting confirmation from vascular surgery regarding the use of aspirin .  HYPERTENSION: Your high blood pressure is well-managed with your current medications. -Continue taking amlodipine  10 mg daily and telmisartan  40 mg daily.  HYPERLIPIDEMIA: Your cholesterol levels are well-managed with atorvastatin . -Continue taking atorvastatin  40 mg daily.  VITAMIN D  DEFICIENCY: You have a vitamin D  deficiency that is being managed with supplements. -Continue taking your vitamin D  supplements.  WELLNESS VISIT: We discussed general health maintenance, including diet and exercise. -Continue eating a healthy diet with plenty of fruits and vegetables. -Engage in regular exercise. -Avoid excessive calories and alcohol.                      Contains text generated by Abridge.                                 Contains text generated  by Abridge.

## 2023-06-10 NOTE — Patient Instructions (Signed)
 Please look into dimethyl fumerate (Tecfidera) for multiple sclerosis and let me know if you would like to proceed with this.   Continue all your medications as you are taking.

## 2023-06-10 NOTE — Assessment & Plan Note (Signed)
 Decently managed with lifestyle. No meds at this time

## 2023-06-10 NOTE — Progress Notes (Signed)
 Subjective:  Patient ID: Russell Los., male    DOB: 23-Jun-1970  Age: 53 y.o. MRN: [id number]  Chief Complaint  Patient presents with   Establish Care    HPI:  Patient is establishing as a new patient. Discussed the use of AI scribe software for clinical note transcription with the patient, who gave verbal consent to proceed.  History of Present Illness   Russell Moses. is a 53 year old male, here to establish care.   He underwent a left carotid endarterectomy on April 09, 2023, following an event on March 12, 2023, where he experienced a severe headache at work. Subsequent tests revealed 60-65% stenosis in the left carotid artery. He experienced expressive aphasia, which has improved but still causes occasional speech difficulties. He completed speech therapy and was on dual antiplatelet therapy post-surgery.  He has a history of multiple sclerosis diagnosed in 2015, characterized by numbness and weakness in his hands. He was previously on glatiramer  acetate injections but stopped in February 2025 due to issues with injection sites. He is considering alternative treatments.  He takes amlodipine  10 mg daily, telmisartan  40 mg daily, atorvastatin  40 mg daily, clopidogrel  75 mg daily, aspirin  81 mg daily, and vitamin D  supplements. He reports that his body has been rejecting certain foods post-surgery, preferring healthier options like fruits and salmon.  No bipolar disorder, asthma, chronic low back pain, or erectile dysfunction. Occasional acid reflux depending on diet and a history of psoriasis that resolved with dietary changes. He has a belly button hernia and seasonal allergies managed with over-the-counter medication.  He works at [employer], is on his feet all day, and lives with his mother. No issues with sleep, anxiety, or chronic pain. He had an eye exam last month, which was normal.          06/10/2023    2:45 PM 05/13/2023    9:59 AM 06/11/2022    9:11 AM 06/05/2021    1:31 PM  05/30/2020   11:09 AM  Depression screen PHQ 2/9  Decreased Interest 0 0 0 0 0  Down, Depressed, Hopeless 0 0 0 0 0  PHQ - 2 Score 0 0 0 0 0  Altered sleeping    0   Tired, decreased energy    1   Change in appetite    0   Feeling bad or failure about yourself     0   Trouble concentrating    0   Moving slowly or fidgety/restless    0   Suicidal thoughts    0   PHQ-9 Score    1          12/10/2022    9:22 AM 03/19/2023    3:19 PM 05/13/2023   10:07 AM 06/10/2023    8:09 AM 06/10/2023    2:45 PM  Fall Risk  Falls in the past year? 0 0 0 0 0  Was there an injury with Fall? 0 0 0 0 0  Fall Risk Category Calculator 0 0 0 0 0  Patient at Risk for Falls Due to   No Fall Risks  No Fall Risks  Fall risk Follow up Falls evaluation completed Falls evaluation completed Falls evaluation completed Falls evaluation completed      Current Outpatient Medications on File Prior to Visit  Medication Sig Dispense Refill   amLODipine  (NORVASC ) 10 MG tablet Take 1 tablet by mouth once daily 90 tablet 3   aspirin  EC 81 MG tablet Take  1 tablet (81 mg total) by mouth daily. 90 tablet 11   atorvastatin  (LIPITOR) 40 MG tablet Take 1 tablet (40 mg total) by mouth at bedtime. 90 tablet 3   Cholecalciferol (D3 ADULT PO) Take 5,000 Units by mouth 2 (two) times a week.     cyanocobalamin  (VITAMIN B12) 1000 MCG tablet Take 1,000 mcg by mouth once a week.     Multiple Vitamin (MULTIVITAMIN WITH MINERALS) TABS tablet Take by mouth 2 (two) times a week.     Omega-3 Fatty Acids (OMEGA-3 FISH OIL PO) Take 970 mg by mouth once a week.     oxyCODONE -acetaminophen  (PERCOCET) 5-325 MG tablet Take 1 tablet by mouth every 6 (six) hours as needed for severe pain (pain score 7-10). 8 tablet 0   telmisartan  (MICARDIS ) 80 MG tablet Take 0.5 tablets (40 mg total) by mouth daily.     No current facility-administered medications on file prior to visit.  . 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   He lives with his parents.  He has no children.   Highest level of education:  High school   Right handed   One story home   Social Drivers of Health   Financial Resource Strain: Low Risk  (05/09/2023)   Overall Financial Resource Strain (CARDIA)    Difficulty of Paying Living Expenses: Not hard at all  Food Insecurity: No Food Insecurity (05/09/2023)   Hunger Vital Sign    Worried About Running Out of Food in the Last Year: Never true    Ran Out of Food in the Last Year: Never true  Transportation Needs: No Transportation Needs (05/09/2023)   PRAPARE - Administrator, Civil Service (Medical): No    Lack of Transportation (Non-Medical): No  Physical Activity: Unknown (05/09/2023)   Exercise Vital Sign    Days of Exercise per Week: Patient declined    Minutes of Exercise per Session: Not on file  Stress: No Stress Concern Present (05/09/2023)   [employer] of Occupational Health - Occupational Stress Questionnaire    Feeling of Stress : Not at all  Social Connections: Moderately Integrated (05/09/2023)   Social Connection and Isolation Panel [NHANES]    Frequency of Communication with Friends and Family: Twice a week    Frequency of Social Gatherings with Friends and Family: More than three times a week    Attends Religious Services: More than 4 times per year    Active Member of [employer] or Organizations: Yes    Attends Tax inspector Meetings: Never    Marital Status: Never married   Past Medical History:  Diagnosis Date   Accidental overdose 05/13/2023   ASTHMA, CHILDHOOD 04/18/2009   Qualifier: Diagnosis of  By: Autry Legions MD, Alveda Aures    BIPOLAR DISORDER UNSPECIFIED 04/18/2009  Qualifier: Diagnosis of  By: Autry Legions MD, Alveda Aures    GERD (gastroesophageal reflux disease) 07/30/2013   Hypertension    Insomnia    Multiple sclerosis (HCC) 2015   Psoriasis 12/15/2013   Stroke (HCC)    Ventral hernia without obstruction or gangrene 12/15/2013   Vitamin D  deficiency 03/18/2019   Family History  Problem Relation Age of Onset   Hypertension Mother    Stroke Father    Diabetes Father    Hypertension Father    Heart disease Father    Parkinson's disease Father    COPD Sister    Fibromyalgia Sister    Healthy Sister     Review of Systems  Constitutional:  Negative for chills, fatigue and fever.  HENT:  Negative for congestion, ear pain, sinus pressure and sore throat.   Respiratory:  Negative for cough and shortness of breath.   Cardiovascular:  Negative for chest pain.  Gastrointestinal:  Negative for abdominal pain, constipation, diarrhea, nausea and vomiting.  Genitourinary:  Negative for dysuria and frequency.  Musculoskeletal:  Negative for arthralgias, back pain and myalgias.  Neurological:  Negative for dizziness and headaches.  Psychiatric/Behavioral:  Negative for dysphoric mood. The patient is not nervous/anxious.      Objective:  BP 118/76   Pulse 60   Temp 97.6 F (36.4 C)   Ht 5' 7.5" (1.715 m)   Wt 189 lb (85.7 kg)   SpO2 98%   BMI 29.16 kg/m      06/10/2023    2:41 PM 06/10/2023    9:00 AM 06/10/2023    8:10 AM  BP/Weight  Systolic BP 118 142 161  Diastolic BP 76 74 80  Wt. (Lbs) 189  189  BMI 29.16 kg/m2  29.16 kg/m2    Physical Exam Vitals and nursing note reviewed.  Constitutional:      Appearance: Normal appearance.  HENT:     Head: Normocephalic.  Neck:      Comments: No carotid bruit noted. Scar from left carotid endarterectomy noted.  Cardiovascular:     Rate and Rhythm: Normal rate and regular rhythm.  Pulmonary:     Effort: Pulmonary effort is normal.     Breath sounds: Normal breath sounds.  Musculoskeletal:        General: Normal range of motion.  Neurological:     General: No focal deficit present.     Mental Status: He is alert.    Diabetic Foot Exam - Simple   No data filed      Lab Results  Component Value Date   WBC 9.5 04/10/2023   HGB 12.8 (L) 04/10/2023   HCT 35.9 (L) 04/10/2023   PLT 142 (L) 04/10/2023   GLUCOSE 144 (H) 04/10/2023   CHOL 138 12/10/2022   TRIG 126.0 12/10/2022   HDL 31.90 (L) 12/10/2022   LDLCALC 80 12/10/2022   ALT 50 (H) 04/09/2023   AST 31 04/09/2023   NA 139 04/10/2023   K 4.0 04/10/2023   CL 109 04/10/2023   CREATININE 0.85 04/10/2023   BUN 11 04/10/2023   CO2 21 (L) 04/10/2023   TSH 1.30 06/11/2022   PSA 0.36 06/11/2022   INR 1.0 04/09/2023   HGBA1C 5.8 12/10/2022      Assessment & Plan:  Primary hypertension  Gastroesophageal reflux disease, unspecified whether esophagitis present Assessment & Plan: Decently managed with lifestyle. No meds at this time    Hyperlipidemia, unspecified hyperlipidemia type Assessment & Plan: Hyperlipidemia managed with atorvastatin  40 mg daily.  Recent cholesterol panel showed low LDL and HDL levels. No immediate concerns.   Multiple sclerosis (HCC) Assessment & Plan: Relapsing-remitting multiple sclerosis Diagnosed in 2015. 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 Considering alternative treatments such as dimethyl fumarate. No recent exacerbations reported. - Consider alternative MS treatment options with neurologist    . Discussed general health maintenance, including diet and exercise. Prefers to manage health through diet and lifestyle changes rather than medications. - Encourage healthy diet  with fruits and vegetables - Advise regular exercise - Avoid excessive calories and alcohol    Other orders -     Clopidogrel  Bisulfate; Take 1 tablet (75 mg total) by mouth at bedtime.  Dispense: 90 tablet; Refill: 1     Assessment and Plan           Meds ordered this encounter  Medications   clopidogrel  (PLAVIX ) 75 MG tablet    Sig: Take 1 tablet (75 mg total) by mouth at bedtime.    Dispense:  90 tablet    Refill:  1   No orders of the defined types were placed in this encounter.     Follow-up: Return in about 8 weeks (around 08/05/2023) for chronic disease follow up.  AVS was given to patient prior to departure.  [facility] [phone]

## 2023-06-11 ENCOUNTER — Encounter: Payer: Self-pay | Admitting: Neurology

## 2023-06-17 ENCOUNTER — Ambulatory Visit: Payer: BC Managed Care – PPO | Admitting: Internal Medicine

## 2023-06-19 ENCOUNTER — Other Ambulatory Visit: Payer: Self-pay | Admitting: Internal Medicine

## 2023-06-19 ENCOUNTER — Other Ambulatory Visit: Payer: Self-pay

## 2023-06-24 ENCOUNTER — Encounter

## 2023-06-27 ENCOUNTER — Encounter: Payer: Self-pay | Admitting: Vascular Surgery

## 2023-07-02 ENCOUNTER — Other Ambulatory Visit: Payer: Self-pay

## 2023-07-02 MED ORDER — AMLODIPINE BESYLATE 10 MG PO TABS: 10.0000 mg | ORAL_TABLET | Freq: Every day | ORAL | 1 refills | Status: AC

## 2023-07-02 NOTE — Telephone Encounter (Signed)
 Refill sent to pharmacy.

## 2023-07-11 ENCOUNTER — Encounter: Payer: Self-pay | Admitting: Vascular Surgery

## 2023-08-05 ENCOUNTER — Ambulatory Visit (INDEPENDENT_AMBULATORY_CARE_PROVIDER_SITE_OTHER)

## 2023-08-05 VITALS — BP 130/64 | HR 60 | Temp 97.5°F | Resp 16 | Ht 67.5 in | Wt 184.0 lb

## 2023-08-05 DIAGNOSIS — Z8673 Personal history of transient ischemic attack (TIA), and cerebral infarction without residual deficits: Secondary | ICD-10-CM | POA: Diagnosis not present

## 2023-08-05 DIAGNOSIS — E785 Hyperlipidemia, unspecified: Secondary | ICD-10-CM

## 2023-08-05 DIAGNOSIS — G35 Multiple sclerosis: Secondary | ICD-10-CM | POA: Diagnosis not present

## 2023-08-05 DIAGNOSIS — I10 Essential (primary) hypertension: Secondary | ICD-10-CM

## 2023-08-05 NOTE — Assessment & Plan Note (Signed)
 Hyperlipidemia managed with atorvastatin  40 mg daily. Recent cholesterol panel showed low LDL and HDL levels. No immediate concerns. Will repeat labs

## 2023-08-05 NOTE — Assessment & Plan Note (Signed)
 Hypertension is well-controlled with amlodipine  10 mg daily and telmisartan  40 mg daily. Blood pressure is approximately 130/60 mmHg. He is monitoring salt intake and has lost weight since the last visit. Medication adjustments may be considered if blood pressure decreases to 120s or 110s. Lifestyle modifications are encouraged to maintain control. - Continue amlodipine  10 mg daily and telmisartan  40 mg daily. - Monitor blood pressure at home. - Encourage continued weight loss and salt intake monitoring. - if BP lowers further, could cut the dose of Amlodipine  to 5 mg daily

## 2023-08-05 NOTE — Patient Instructions (Signed)
  VISIT SUMMARY:  Today, we discussed your medication management and general health. We reviewed your blood pressure, sleep issues, and overall health maintenance, including diet and physical activity.  YOUR PLAN:  HYPERTENSION: Your blood pressure is well-controlled with your current medications. -Continue taking amlodipine  10 mg daily and telmisartan  40 mg daily. -Monitor your blood pressure at home. -Keep up with your weight loss and monitor your salt intake. - IF BP COMES DOWN, OKAY TO TAKE HALF OF AMLODIPINE   CAROTID ARTERY STENOSIS: You had surgery to address carotid artery stenosis and are on medications to prevent stroke. -Continue taking Plavix  and Lipitor as prescribed.  SLEEP DISTURBANCE: You have been having trouble sleeping, which is not a known side effect of your medications. -Consider taking melatonin 1 mg for sleep. -Increase your physical activity, including brisk walking and strength training, to help improve sleep quality.  GENERAL HEALTH MAINTENANCE: You are actively managing your diet and considering increasing your physical activity. -Order blood work to check your cholesterol levels. -Continue with your healthy diet and weight loss. -Increase your physical activity, including brisk walking and strength training.                      Contains text generated by Abridge.                                 Contains text generated by Abridge.

## 2023-08-05 NOTE — Assessment & Plan Note (Signed)
 Relapsing-remitting multiple sclerosis Diagnosed in 2015. 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 Considering alternative treatments such as dimethyl fumarate. No recent exacerbations reported. - Consider alternative MS treatment options with neurologist    . Discussed general health maintenance, including diet and exercise. Prefers to manage health through diet and lifestyle changes rather than medications. - Encourage healthy diet with fruits and vegetables - Advise regular exercise - Avoid excessive calories and alcohol

## 2023-08-05 NOTE — Assessment & Plan Note (Addendum)
 Left carotid endarterectomy performed. He is on Plavix  and Lipitor for stroke prevention and carotid artery stenosis management. He is on Plavix  and Lipitor for stroke prevention and carotid artery stenosis management. These medications are necessary due to his stroke and carotid artery stenosis. - Continue Plavix  and Lipitor as prescribed.      Sleep Disturbance Reports difficulty sleeping, not a known side effect of Plavix  or Lipitor. Considering lifestyle modifications and melatonin for sleep improvement. Exercise and melatonin are preferred over sleep-inducing medications due to potential weight gain and other side effects. - Consider taking melatonin 1 mg for sleep. - Increase physical activity, including brisk walking and strength training, to improve sleep quality.  General Health Maintenance Actively managing diet by reducing salt and sugar intake and has lost weight. Considering increasing physical activity by walking at a local recreation area. Blood work is due for cholesterol monitoring. Declined hepatitis and HIV screening. - Order blood work to check cholesterol levels. - Encourage continued healthy diet and weight loss. - Encourage increased physical activity, including brisk walking and strength training.

## 2023-08-05 NOTE — Progress Notes (Signed)
 Subjective:  Patient ID: Russell Claudene Raddle., male    DOB: August 12, 1970  Age: 53 y.o. MRN: [id number]  Chief Complaint  Patient presents with   Medical Management of Chronic Issues    HPI: Discussed the use of AI scribe software for clinical note transcription with the patient, who gave verbal consent to proceed.  History of Present Illness   Russell Saheed Carrington. is a 53 year old male with hypertension and coronary artery disease who presents for medication management and follow-up after carotid endarterectomy which he had earlier this year.   Medication side effects and adherence - Difficulty sleeping since initiation of atorvastatin  (Lipitor) and clopidogrel  (Plavix ) - Occasionally forgets to take antihypertensive medications; sometimes takes them later in the day - Slept well on one occasion after taking antihypertensive medication with milk - Uncertainty regarding continuation of antihypertensive regimen given current blood pressure readings around 130/60 mmHg  Hypertension and cardiovascular disease management - History of hypertension and coronary artery disease - Current antihypertensive regimen: amlodipine  10 mg daily and telmisartan  40 mg daily - Blood pressure readings approximately 130/60 mmHg - Underwent left carotid endarterectomy after a left MCA stroke in Feb 2025 - Careful with salt intake and dietary choices since surgery  Dietary habits and weight changes - Change in appetite; guided by internal cues, often limiting intake of sweets and other foods - Five-pound weight loss since May 5th - Primarily drinks water; minimal tea consumption - Eating healthier since carotid endarterectomy  Physical activity - 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), frequently walking during shifts - Considering additional walking for exercise on days off  Hematologic and metabolic history - History of anemia - Elevated blood glucose levels following surgery  Gastrointestinal and oral health - No  abdominal pain or discomfort - Normal bowel movements - No bleeding with tongue scraping - No recollection of snoring - Dentures in both jaws; often does not wear lower denture  Lipid management - Cholesterol levels last checked in November 2012         08/05/2023    9:17 AM 06/10/2023    2:45 PM 05/13/2023    9:59 AM 06/11/2022    9:11 AM 06/05/2021    1:31 PM  Depression screen PHQ 2/9  Decreased Interest 0 0 0 0 0  Down, Depressed, Hopeless 0 0 0 0 0  PHQ - 2 Score 0 0 0 0 0  Altered sleeping 1    0  Tired, decreased energy 0    1  Change in appetite 0    0  Feeling bad or failure about yourself  0    0  Trouble concentrating 0    0  Moving slowly or fidgety/restless 0    0  Suicidal thoughts 0    0  PHQ-9 Score 1    1  Difficult doing work/chores Not difficult at all            08/05/2023    9:16 AM  Fall Risk   Falls in the past year? 0  Number falls in past yr: 0  Injury with Fall? 0  Risk for fall due to : No Fall Risks  Follow up Education provided    Patient Care Team: Trayshawn Durkin, MD as PCP - General (Family Medicine) Patel, [name] K, DO as Consulting Physician (Neurology)   Review of Systems  Constitutional:  Negative for chills, fatigue, fever and unexpected weight change.  HENT:  Negative for congestion, ear pain, sinus pain and sore throat.  Cardiovascular:  Negative for chest pain and palpitations.  Gastrointestinal:  Negative for abdominal pain, blood in stool, constipation, diarrhea, nausea and vomiting.  Endocrine: Negative for polydipsia.  Genitourinary:  Negative for dysuria.  Musculoskeletal:  Negative for back pain.  Skin:  Negative for rash.  Neurological:  Negative for headaches.  Psychiatric/Behavioral:  Positive for sleep disturbance.     Current Outpatient Medications on File Prior to Visit  Medication Sig Dispense Refill   amLODipine  (NORVASC ) 10 MG tablet Take 1 tablet (10 mg total) by mouth daily. 90 tablet 1   atorvastatin   (LIPITOR) 40 MG tablet Take 1 tablet (40 mg total) by mouth at bedtime. 90 tablet 3   b complex vitamins capsule Take 1 capsule by mouth daily.     Cholecalciferol (D3 ADULT PO) Take 5,000 Units by mouth 2 (two) times a week.     clopidogrel  (PLAVIX ) 75 MG tablet Take 1 tablet (75 mg total) by mouth at bedtime. 90 tablet 1   cyanocobalamin  (VITAMIN B12) 1000 MCG tablet Take 1,000 mcg by mouth once a week.     Multiple Vitamin (MULTIVITAMIN WITH MINERALS) TABS tablet Take by mouth 2 (two) times a week.     Omega-3 Fatty Acids (OMEGA-3 FISH OIL PO) Take 970 mg by mouth once a week.     telmisartan  (MICARDIS ) 80 MG tablet Take 0.5 tablets (40 mg total) by mouth daily.     No current facility-administered medications on file prior to visit.   Past Medical History:  Diagnosis Date   Accidental overdose 05/13/2023   ASTHMA, CHILDHOOD 04/18/2009   Qualifier: Diagnosis of  By: Norleen MD, [place] ORN    BIPOLAR DISORDER UNSPECIFIED 04/18/2009   Qualifier: Diagnosis of  By: Norleen MD, [place] ORN    GERD (gastroesophageal reflux disease) 07/30/2013   Hypertension    Insomnia    Multiple sclerosis (HCC) 2015   Psoriasis 12/15/2013   Stroke (HCC)    Ventral hernia without obstruction or gangrene 12/15/2013   Vitamin D  deficiency 03/18/2019   Past Surgical History:  Procedure Laterality Date   CHOLECYSTECTOMY  09/2020   ENDARTERECTOMY Left 04/09/2023   Procedure: LEFT CAROTID ENDARTERECTOMY WITH PATCH ANGIOPLASTY;  Surgeon: Pearline Norman RAMAN, MD;  Location: [facility] OR;  Service: Vascular;  Laterality: Left;   HERNIA REPAIR     x2   PATCH ANGIOPLASTY Left 04/09/2023   Procedure: ANGIOPLASTY, USING 1 CM X 6 CM XENOSURE PATCH GRAFT;  Surgeon: Pearline Norman RAMAN, MD;  Location: [facility] OR;  Service: Vascular;  Laterality: Left;    Family History  Problem Relation Age of Onset   Hypertension Mother    Stroke Father    Diabetes Father    Hypertension Father    Heart disease Father    Parkinson's disease Father    COPD  Sister    Fibromyalgia Sister    Healthy Sister    Social History   Socioeconomic History   Marital status: Single    Spouse name: Not on file   Number of children: 0   Years of education: 12   Highest education level: 12th grade  Occupational History   Occupation: Lobbyist: [employer]  Tobacco Use   Smoking status: Never   Smokeless tobacco: Never  Vaping Use   Vaping status: Never Used  Substance and Sexual Activity   Alcohol use: No    Alcohol/week: 0.0 standard drinks of alcohol   Drug use: No   Sexual activity: Not Currently  Other  Topics Concern   Not on file  Social History Narrative   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   He lives with his parents.  He has no children.   Highest level of education:  High school   Right handed   One story home   Social Drivers of Health   Financial Resource Strain: Low Risk  (08/01/2023)   Overall Financial Resource Strain (CARDIA)    Difficulty of Paying Living Expenses: Not hard at all  Food Insecurity: No Food Insecurity (08/01/2023)   Hunger Vital Sign    Worried About Running Out of Food in the Last Year: Never true    Ran Out of Food in the Last Year: Never true  Transportation Needs: No Transportation Needs (08/01/2023)   PRAPARE - Administrator, Civil Service (Medical): No    Lack of Transportation (Non-Medical): No  Physical Activity: Unknown (08/01/2023)   Exercise Vital Sign    Days of Exercise per Week: Patient declined    Minutes of Exercise per Session: Not on file  Stress: Patient Declined (08/01/2023)   [employer] of Occupational Health - Occupational Stress Questionnaire    Feeling of Stress: Patient declined  Social Connections: Moderately Isolated (08/01/2023)   Social Connection and Isolation Panel    Frequency of Communication with Friends and Family: Patient declined    Frequency of Social Gatherings with Friends and Family: More than three times a week     Attends Religious Services: More than 4 times per year    Active Member of [employer] or Organizations: No    Attends Engineer, structural: Not on file    Marital Status: Never married    Objective:  BP 130/64   Pulse 60   Temp (!) 97.5 F (36.4 C)   Resp 16   Ht 5' 7.5 (1.715 m)   Wt 184 lb (83.5 kg)   SpO2 97%   BMI 28.39 kg/m      08/05/2023    9:08 AM 06/10/2023    2:41 PM 06/10/2023    9:00 AM  BP/Weight  Systolic BP 130 118 142  Diastolic BP 64 76 74  Wt. (Lbs) 184 189   BMI 28.39 kg/m2 29.16 kg/m2     Physical Exam Vitals and nursing note reviewed.  Constitutional:      Appearance: Normal appearance.  HENT:     Head: Normocephalic and atraumatic.     Mouth/Throat:     Comments: Dentures noted on upper jaw Neck:     Vascular: No carotid bruit.     Comments: Left carotid endarterectomy scar noted Cardiovascular:     Rate and Rhythm: Normal rate and regular rhythm.  Pulmonary:     Effort: Pulmonary effort is normal.     Breath sounds: Normal breath sounds.   Musculoskeletal:        General: Normal range of motion.     Cervical back: Normal range of motion.  Lymphadenopathy:     Cervical: No cervical adenopathy.   Skin:    General: Skin is warm.   Neurological:     General: No focal deficit present.     Mental Status: He is alert.   Psychiatric:        Mood and Affect: Mood normal.         Lab Results  Component Value Date   WBC 9.5 04/10/2023   HGB 12.8 (L) 04/10/2023   HCT 35.9 (L) 04/10/2023   PLT 142 (  L) 04/10/2023   GLUCOSE 144 (H) 04/10/2023   CHOL 138 12/10/2022   TRIG 126.0 12/10/2022   HDL 31.90 (L) 12/10/2022   LDLCALC 80 12/10/2022   ALT 50 (H) 04/09/2023   AST 31 04/09/2023   NA 139 04/10/2023   K 4.0 04/10/2023   CL 109 04/10/2023   CREATININE 0.85 04/10/2023   BUN 11 04/10/2023   CO2 21 (L) 04/10/2023   TSH 1.30 06/11/2022   PSA 0.36 06/11/2022   INR 1.0 04/09/2023   HGBA1C 5.8 12/10/2022      Assessment &  Plan:  Primary hypertension Assessment & Plan: Hypertension is well-controlled with amlodipine  10 mg daily and telmisartan  40 mg daily. Blood pressure is approximately 130/60 mmHg. He is monitoring salt intake and has lost weight since the last visit. Medication adjustments may be considered if blood pressure decreases to 120s or 110s. Lifestyle modifications are encouraged to maintain control. - Continue amlodipine  10 mg daily and telmisartan  40 mg daily. - Monitor blood pressure at home. - Encourage continued weight loss and salt intake monitoring. - if BP lowers further, could cut the dose of Amlodipine  to 5 mg daily  Orders: -     CBC with Differential/Platelet -     Comprehensive metabolic panel with GFR  Hyperlipidemia, unspecified hyperlipidemia type Assessment & Plan: Hyperlipidemia managed with atorvastatin  40 mg daily. Recent cholesterol panel showed low LDL and HDL levels. No immediate concerns. Will repeat labs  Orders: -     Lipid panel -     TSH  Multiple sclerosis (HCC) Assessment & Plan: Relapsing-remitting multiple sclerosis Diagnosed in 2015. 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 Considering alternative treatments such as dimethyl fumarate. No recent exacerbations reported. - Consider alternative MS treatment options with neurologist    . Discussed general health maintenance, including diet and exercise. Prefers to manage health through diet and lifestyle changes rather than medications. 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 He is on Plavix  and Lipitor for stroke prevention and carotid artery stenosis management. He is on Plavix  and Lipitor for stroke  prevention and carotid artery stenosis management. These medications are necessary due to his stroke and carotid artery stenosis. - Continue Plavix  and Lipitor as prescribed.      Sleep Disturbance Reports difficulty sleeping, not a known side effect of Plavix  or Lipitor. Considering lifestyle modifications and melatonin for sleep improvement. Exercise and melatonin are preferred over sleep-inducing medications due to potential weight gain and other side effects. - Consider taking melatonin 1 mg for sleep. - Increase physical activity, including brisk walking and strength training, to improve sleep quality.  General Health Maintenance Actively managing diet by reducing salt and sugar intake and has lost weight. Considering increasing physical activity by walking at a local recreation area. Blood work is due for cholesterol monitoring. Declined hepatitis and HIV screening. - Order blood work to check cholesterol levels. - Encourage continued healthy diet and weight loss. - Encourage increased physical activity, including brisk walking and strength training.     Assessment and Plan            No orders of the defined types were placed in this encounter.   Orders Placed This Encounter  Procedures   CBC with Differential/Platelet   Comprehensive metabolic  panel with GFR   Lipid panel   TSH     Follow-up: Return for chronic disease follow up.    An After Visit Summary was printed and given to the patient.  Rieley Hausman, MD [facility] [phone]

## 2023-08-06 ENCOUNTER — Ambulatory Visit: Payer: Self-pay

## 2023-08-06 LAB — CBC WITH DIFFERENTIAL/PLATELET
Basophils Absolute: 0 10*3/uL (ref 0.0–0.2)
Basos: 1 %
EOS (ABSOLUTE): 0.2 10*3/uL (ref 0.0–0.4)
Eos: 5 %
Hematocrit: 45.4 % (ref 37.5–51.0)
Hemoglobin: 14.7 g/dL (ref 13.0–17.7)
Immature Grans (Abs): 0 10*3/uL (ref 0.0–0.1)
Immature Granulocytes: 0 %
Lymphocytes Absolute: 1.7 10*3/uL (ref 0.7–3.1)
Lymphs: 35 %
MCH: 30.2 pg (ref 26.6–33.0)
MCHC: 32.4 g/dL (ref 31.5–35.7)
MCV: 93 fL (ref 79–97)
Monocytes Absolute: 0.4 10*3/uL (ref 0.1–0.9)
Monocytes: 9 %
Neutrophils Absolute: 2.4 10*3/uL (ref 1.4–7.0)
Neutrophils: 50 %
Platelets: 169 10*3/uL (ref 150–450)
RBC: 4.86 x10E6/uL (ref 4.14–5.80)
RDW: 12.3 % (ref 11.6–15.4)
WBC: 4.8 10*3/uL (ref 3.4–10.8)

## 2023-08-06 LAB — COMPREHENSIVE METABOLIC PANEL WITH GFR
ALT: 39 IU/L (ref 0–44)
AST: 28 IU/L (ref 0–40)
Albumin: 4.4 g/dL (ref 3.8–4.9)
Alkaline Phosphatase: 109 IU/L (ref 44–121)
BUN/Creatinine Ratio: 9 (ref 9–20)
BUN: 9 mg/dL (ref 6–24)
Bilirubin Total: 0.5 mg/dL (ref 0.0–1.2)
CO2: 19 mmol/L — ABNORMAL LOW (ref 20–29)
Calcium: 9.8 mg/dL (ref 8.7–10.2)
Chloride: 104 mmol/L (ref 96–106)
Creatinine, Ser: 0.96 mg/dL (ref 0.76–1.27)
Globulin, Total: 2.3 g/dL (ref 1.5–4.5)
Glucose: 107 mg/dL — ABNORMAL HIGH (ref 70–99)
Potassium: 4.7 mmol/L (ref 3.5–5.2)
Sodium: 139 mmol/L (ref 134–144)
Total Protein: 6.7 g/dL (ref 6.0–8.5)
eGFR: 94 mL/min/{1.73_m2} (ref >59)

## 2023-08-06 LAB — TSH: TSH: 1.45 u[IU]/mL (ref 0.450–4.500)

## 2023-08-06 LAB — LIPID PANEL
Chol/HDL Ratio: 2.3 ratio (ref 0.0–5.0)
Cholesterol, Total: 81 mg/dL — ABNORMAL LOW (ref 100–199)
HDL: 35 mg/dL — ABNORMAL LOW (ref >39)
LDL Chol Calc (NIH): 29 mg/dL (ref 0–99)
Triglycerides: 86 mg/dL (ref 0–149)
VLDL Cholesterol Cal: 17 mg/dL (ref 5–40)

## 2023-08-12 ENCOUNTER — Encounter: Payer: Self-pay | Admitting: Sports Medicine

## 2023-08-19 NOTE — Progress Notes (Unsigned)
    Ben Jackson D.[facility] [address] [place] [address] Phone: [phone]   Assessment and Plan:     There are no diagnoses linked to this encounter.  ***   Pertinent previous records reviewed include ***    Follow Up: ***     Subjective:   I, Russell Moses, am serving as a Neurosurgeon for Doctor Morene Mace   Chief Complaint: right knee pain    HPI:    12/28/2022 Patient is a 53 year old male with concerns of right knee pain. Patient states his pain has been good today has been using tylenol  and tramadol  and that helped him sleep . He's been eating right. He can feel that meds have worn off . Pain since Tuesday is in arthritis flare. Got a CSI,  [facility] and it only worked 1 day. Pain when walking up steps    01/22/2023 Patient states that his knee pain has resolved   08/20/2023 Patient states   Relevant Historical Information:  GERD, MS  Additional pertinent review of systems negative.   Current Outpatient Medications:    amLODipine  (NORVASC ) 10 MG tablet, Take 1 tablet (10 mg total) by mouth daily., Disp: 90 tablet, Rfl: 1   atorvastatin  (LIPITOR) 40 MG tablet, Take 1 tablet (40 mg total) by mouth at bedtime., Disp: 90 tablet, Rfl: 3   b complex vitamins capsule, Take 1 capsule by mouth daily., Disp: , Rfl:    Cholecalciferol (D3 ADULT PO), Take 5,000 Units by mouth 2 (two) times a week., Disp: , Rfl:    clopidogrel  (PLAVIX ) 75 MG tablet, Take 1 tablet (75 mg total) by mouth at bedtime., Disp: 90 tablet, Rfl: 1   cyanocobalamin  (VITAMIN B12) 1000 MCG tablet, Take 1,000 mcg by mouth once a week., Disp: , Rfl:    Multiple Vitamin (MULTIVITAMIN WITH MINERALS) TABS tablet, Take by mouth 2 (two) times a week., Disp: , Rfl:    Omega-3 Fatty Acids (OMEGA-3 FISH OIL PO), Take 970 mg by mouth once a week., Disp: , Rfl:    telmisartan  (MICARDIS ) 80 MG tablet, Take 0.5 tablets (40 mg total) by mouth daily., Disp: , Rfl:     Objective:     There were no vitals filed for this visit.    There is no height or weight on file to calculate BMI.    Physical Exam:    ***   Electronically signed by:  Odis Mace D.[facility] 12:36 PM 08/19/23

## 2023-08-20 ENCOUNTER — Ambulatory Visit: Admitting: Sports Medicine

## 2023-08-20 VITALS — HR 44 | Ht 67.0 in | Wt 184.0 lb

## 2023-08-20 DIAGNOSIS — G8929 Other chronic pain: Secondary | ICD-10-CM | POA: Diagnosis not present

## 2023-08-20 DIAGNOSIS — M1711 Unilateral primary osteoarthritis, right knee: Secondary | ICD-10-CM | POA: Diagnosis not present

## 2023-08-20 DIAGNOSIS — M25561 Pain in right knee: Secondary | ICD-10-CM

## 2023-08-20 NOTE — Patient Instructions (Signed)
 Tylenol  [phone] mg 2-3 times a day for pain relief  Continue knee HEP  As needed follow up

## 2023-10-02 NOTE — Telephone Encounter (Signed)
 Error

## 2023-10-10 ENCOUNTER — Encounter: Payer: Self-pay | Admitting: Vascular Surgery

## 2023-10-28 ENCOUNTER — Ambulatory Visit (INDEPENDENT_AMBULATORY_CARE_PROVIDER_SITE_OTHER): Admitting: Neurology

## 2023-10-28 ENCOUNTER — Encounter: Payer: Self-pay | Admitting: Neurology

## 2023-10-28 VITALS — BP 162/72 | HR 49 | Ht 67.0 in | Wt 181.0 lb

## 2023-10-28 DIAGNOSIS — I63132 Cerebral infarction due to embolism of left carotid artery: Secondary | ICD-10-CM | POA: Diagnosis not present

## 2023-10-28 DIAGNOSIS — G35 Multiple sclerosis: Secondary | ICD-10-CM | POA: Diagnosis not present

## 2023-10-28 NOTE — Progress Notes (Signed)
 Follow-up Visit   Date: 10/28/23    Russell Moses. MRN: [id number] DOB: 1970/06/17   Interim History: Russell Moses. is a 53 y.o. right-handed Caucasian male with history of hypertension, bipolar disorder, asthma, herpetic zoster, and anxiety returning for follow-up of LMCA stroke and multiple sclerosis.  The patient was accompanied to the clinic by mother.  History of present illness: In 2014, he developed a tingling sensation over his head and get swimming headed, which lasted a few seconds and spontaneously resolved. Starting in February 2015, he developed tremors of the hands and bilateral leg weakness. MRI brain in April 2015 showed white matter changes concerning for demyelinating disease.  CSF testing was also supportive of MS.  He was treated with 3-days of IVMP in May 2015 which significantly improved symptoms.  He started copaxone  on 10/2013 and has been tolerating it well, besides injection site lipodystrophy.  Annual surveillance imaging has shown stable disease burden and he has not had any relapses since initial diagnosis in Spring 2015.  UPDATE 06/18/2022:  He is doing well. He continues to be compliant with glatiramer  acetate injections but has noticed increased lipodystrophy, so woud like to explore other options. He is taking L-threonine supplement which helps him feel calm and has improved his sleep.  MRI brain is stable without progression.  He denies any new neurological symptoms.   UPDATE 12/10/2022:  He is here for follow-up visit.  He reports having infection of the abdomen from injection site.  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  He is undecided about medication because he is trying to find the right balance of amino acids.  No new neurological complaints.  He is uptodate with eye exam.   UPDATE 03/19/2023:  He is here for sooner than scheduled follow-up due to recent hospitalization.  He developed acute onset of  word-finding difficulty as well as trying to get words out to speak on 2/4, which prompted him to go to [facility] ER.  He was also having a mild headache.  He recalls being able to read, but unable to express the words.  No associated headache, numbness/tingling.  He had MRI brain and was told he had a mild stroke and started on plavix  75mg  and atorvastatin  40mg .  I do not have any records to review.  He continues to have difficulty with naming and getting words out.  No weakness, numbness/tingling, or vision changes.    UPDATE 06/10/2023:  He is here for follow-up visit.  He underwent left CEA in March and doing well.  He completed speech therapy which has helped.  He continues to have some word-finding difficulty, but is able to recall the word better now.  No new neurological symptoms.  He is compliant with aspirin , plavix , and statin therapy.  He has not taken his glatiramer  acetate 40mg  SQ injection since February.  He will be returning home tomorrow.   UPDATE 10/28/2023:  He is here for follow-up visit.  He reports stopping glatiramer  acetate since his stroke in February and has not restarted this.  He denies having any new neurological symptoms.  He has mild residual aphasia from his stroke.  He has been trying to make lifestyle changes with diet and exercise.  His blood pressure is elevated today.  He did take morning medications.  Home BP is SBP 130-150s.   Medications:  Current Outpatient Medications on File Prior to Visit  Medication Sig Dispense Refill   amLODipine  (  NORVASC ) 10 MG tablet Take 1 tablet (10 mg total) by mouth daily. 90 tablet 1   atorvastatin  (LIPITOR) 40 MG tablet Take 1 tablet (40 mg total) by mouth at bedtime. 90 tablet 3   b complex vitamins capsule Take 1 capsule by mouth daily.     Cholecalciferol (D3 ADULT PO) Take 5,000 Units by mouth 2 (two) times a week.     clopidogrel  (PLAVIX ) 75 MG tablet Take 1 tablet (75 mg total) by mouth at bedtime. 90 tablet 1    cyanocobalamin  (VITAMIN B12) 1000 MCG tablet Take 1,000 mcg by mouth once a week.     Multiple Vitamin (MULTIVITAMIN WITH MINERALS) TABS tablet Take by mouth 2 (two) times a week.     Omega-3 Fatty Acids (OMEGA-3 FISH OIL PO) Take 970 mg by mouth once a week.     telmisartan  (MICARDIS ) 80 MG tablet Take 0.5 tablets (40 mg total) by mouth daily.     No current facility-administered medications on file prior to visit.    Allergies:  Allergies  Allergen Reactions   Zyrtec  [Cetirizine ] Other (See Comments)    Fatigued, felt bad, weak   Penicillins Itching and Rash    Little bumps     Vital Signs:  BP (!) 188/83   Pulse (!) 47   Ht 5' 7 (1.702 m)   Wt 181 lb (82.1 kg)   SpO2 97%   BMI 28.35 kg/m    Neurological Exam: MENTAL STATUS including orientation to time, place, person, recent and remote memory, attention span and concentration, language, and fund of knowledge is normal.  Speech is not dysarthric.  Repetition is intact.  Naming is intact.   CRANIAL NERVES:  Pupils equal round and reactive to light.  Normal conjugate, extra-ocular eye movements in all directions of gaze.  No ptosis. Normal facial sensation.  Face is symmetric. Palate elevates symmetrically.  Tongue is midline.  MOTOR:  Motor strength is 5/5 in all extremities.  No atrophy, fasciculations or abnormal movements.  No pronator drift.  Tone is normal.    MSRs:  Reflexes are 2+/4 throughout.  SENSORY:  Intact to vibration throughout.  COORDINATION/GAIT:   Intact rapid alternating movements bilaterally.  Gait narrow based and stable.    Data: MRI cervical spine wwo contrast 05/09/2022: Motion degraded examination. No evidence of demyelinating disease in the cervical spinal cord.  MRI brain wwo contrast 05/09/2022: Unchanged cerebral white matter disease consistent with multiple sclerosis. No evidence of active demyelination.  MRI brain wwo contrast 05/29/2021:  No cervical cord lesions.  MRI cervical spine  wo contrast 05/29/2021:  Stable burden of cerebral white matter disease compatible with history of multiple sclerosis. No abnormal enhancement.  MRI brain wwo contrast 01/04/2020:  No change  MRI brain wwo contrast 11/03/2018:  No new lesions compared with December 02, 2017.  No lesions with acute features.   MRI brain wwo contrast 11/28/2016:   Essentially stable findings of multiple sclerosis. No acute intracranial abnormality.  MRI brain wwo contrast 11/17/2015:  No significant changes since 12/16/2014.  No abnormal enhancement.  MRI brain wwo contrast 12/16/2014: 1.  Unchanged supratentorial white matter lesions.  No definite new lesions.  No abnormal enhancement or restricted diffusion. 2.  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 3.  Paransal sinus inflammatory change, correlate clinically.  MRI brain wwo contrast 12/25/2013:  Multiple lesoins in the periventricular and juxtacortical white matter regions bilaterally.  There is no longer enhancement or diffusion abnormality.  No new lesions MRI cervical spine wwo contrast:  Normal  MRI brain wwo contrast 05/28/2013:  Multiple lesions in the periventricular and juxtacortical white matter bilaterally, one of which enhances.  The pattern and distribution of these lesions suggest demyelinating disease such as multiple sclerosis.  MRI cervical spine wwo contrast 06/04/2013:  Normal MRI cervical spine   EMG right upper and lower extremity 05/14/2013:  There is electrophysiological evidence of an intraspinal canal lesion (i.e. radiculopathy) affecting the L5 nerve root/segment bilaterally. Overall, these changes are mild in degree electrically and worse on the right side. There is no evidence of a generalized sensorimotor polyneuropathy, cervical radiculopathy or diffuse myopathy.   CSF 06/22/2013: R0 W0 G61 P27  ACE 4, Lyme neg, MBP 61, IgG 1.6 OCB The patient's CSF contains  >5 well defined gamma restriction bands that are also present in the patient's  corresponding serum sample, but some bands in the CSF are more prominent.  CSF cytology with few mononuclear cells  Lab Results  Component Value Date   TSH 1.450 08/05/2023   Lab Results  Component Value Date   VITAMINB12 383 12/10/2022   [facility] records reviewed and summarized below: CTA head and neck:  Large evolving infarct in the left posterior MCA territory. No mass effect or hemorrhagic changes. No large vessel occlusion. Atherosclerotic plaques along the carotid bulb with moderate 50-70% stenosis of the left ICA origin and mild > 50% at the right ICA origin.  Multifocal atherosclerotic changes including inferior M2 division of the left MCA and right P2 PCA. MRI brain 03/14/2023: moderate sized acute to subacute infarct involving the left posterior MCA territory EKG:  NSR TTE with bubble 03/15/2023:  EF 50-60%, left ventricular diastolic function has an impaired relaxation, trace AR, no PFO Lipid panel:  LDL 105, TG 98, Chol 160, HDL 35 HBA1c 5.7  IMPRESSION/PLAN: Left posterior MCA territory stroke manifesting with expressive aphasia (03/2023), minimal residual word-finding difficulty.  Etiology:  Left carotid stenosis s/p CEA in March 2025.  He has intracranial stenosis including inferior left M2 MCA and right P2 PCA.  Completed dual antiplatelet therapy, now on plavix  75mg  daily.  - Continue plavix  75mg  daily - Continue atorvastatin  40mg  daily - There is room to optimize his hypertension,  BP is elevated upon recheck and there is also bradycardia.  He is asymptomatic.  Prior EKG from 04/2023 was reviewed and shows sinus bradycardia.  Referral to cardiology was declined, he would like to talk to his PCP.    Relapsing-remitting multiple sclerosis, diagnosed 2015. He has been off glatiramer  acetate since February 2025. Alternative DMTs discussed, specifically dimethyl fumerate, however, he prefers to  continue lifestyle changes.  Continue vitamin D  supplements.   Return to clinic in 3 months    Thank you for allowing me to participate in patient's care.  If I can answer any additional questions, I would be pleased to do so.    Sincerely,    Nikita Surman K. Tobie, DO

## 2023-10-28 NOTE — Patient Instructions (Signed)
 Please monitor blood pressure at home and if it remains > 130/90, follow-up with PCP.

## 2023-12-01 ENCOUNTER — Other Ambulatory Visit: Payer: Self-pay

## 2023-12-05 ENCOUNTER — Other Ambulatory Visit: Payer: Self-pay | Admitting: Vascular Surgery

## 2023-12-05 DIAGNOSIS — I6522 Occlusion and stenosis of left carotid artery: Secondary | ICD-10-CM

## 2023-12-18 ENCOUNTER — Ambulatory Visit

## 2023-12-18 VITALS — BP 140/80 | HR 60 | Temp 98.9°F | Resp 18 | Ht 67.0 in | Wt 181.0 lb

## 2023-12-18 DIAGNOSIS — R0981 Nasal congestion: Secondary | ICD-10-CM | POA: Diagnosis not present

## 2023-12-18 DIAGNOSIS — J069 Acute upper respiratory infection, unspecified: Secondary | ICD-10-CM

## 2023-12-18 NOTE — Patient Instructions (Signed)
  VISIT SUMMARY: During your visit, we discussed your sore throat and congestion, which started mildly and worsened over the weekend. We also reviewed your blood pressure and multiple sclerosis management.  YOUR PLAN: ACUTE UPPER RESPIRATORY INFECTION WITH NASAL CONGESTION: You have symptoms of a common cold, including a sore throat, nasal congestion, and drainage. There are no signs of pneumonia or strep throat. -Continue using over-the-counter remedies and dietary adjustments. -Increase your fluid intake. -Use salt water gargles and steam inhalation to relieve symptoms. -Take your allergy medication as needed. -We will reassess your symptoms at your follow-up appointment on Monday.  HYPERTENSION: Your blood pressure was slightly elevated, likely due to your current illness. Your home readings were within the normal range. -Continue taking your current blood pressure medications. -We rechecked your blood pressure after the visit.  MULTIPLE SCLEROSIS: You are managing your multiple sclerosis with regular physical activity. -Continue your regular physical activity regimen, including walking and exercises at the recreation center.                      Contains text generated by [employer].                                 Contains text generated by [employer].

## 2023-12-18 NOTE — Progress Notes (Signed)
 Acute Office Visit  Subjective:    Patient ID: Russell Burzynski., male    DOB: 1970-03-19, 53 y.o.   MRN: [id number]  Chief Complaint  Patient presents with   Sore Throat   Sinusitis    HPI:   Discussed the use of AI scribe software for clinical note transcription with the patient, who gave verbal consent to proceed.  History of Present Illness   Russell Bradd Merlos. is a 53 year old male who presents with a sore throat and congestion.  Pharyngitis and upper respiratory symptoms - Sore throat and congestion began mildly on Friday and worsened by Saturday - Throat discomfort described as 'real deep' with significant congestion - Nasal congestion and postnasal drainage present - Sweating occurred, attributed to illness - No over-the-counter medications taken except for a brand of Pitocin for throat relief - Consuming oranges and orange juice, which he feels helps, but orange juice did not feel right this morning - Did not go to work on Tuesday due to feeling unwell, currently at work after taking an early lunch for this appointment  Medication use and allergies - Currently taking Plavix  and blood pressure medications as prescribed - Missed vitamins and minerals on Sunday and Monday due to illness - Took blood pressure medication at 8 AM today - Allergic to Zyrtec  - Uses an off-brand allergy medication effectively, but has not taken it in the past few days  Blood pressure monitoring - Checked blood pressure at home on Sunday or Monday, result was 135/69  Multiple sclerosis and physical activity - History of multiple sclerosis with associated weakness - Engages in regular physical activity at a recreation center to manage MS-related weakness - Walks four to eight times during visits to the center         Past Medical History:  Diagnosis Date   Accidental overdose 05/13/2023   ASTHMA, CHILDHOOD 04/18/2009   Qualifier: Diagnosis of  By: Norleen MD, [place] ORN    BIPOLAR DISORDER  UNSPECIFIED 04/18/2009   Qualifier: Diagnosis of  By: Norleen MD, [place] ORN    GERD (gastroesophageal reflux disease) 07/30/2013   Hypertension    Insomnia    Multiple sclerosis 2015   Psoriasis 12/15/2013   Stroke (HCC)    Ventral hernia without obstruction or gangrene 12/15/2013   Vitamin D  deficiency 03/18/2019    Past Surgical History:  Procedure Laterality Date   CHOLECYSTECTOMY  09/2020   ENDARTERECTOMY Left 04/09/2023   Procedure: LEFT CAROTID ENDARTERECTOMY WITH PATCH ANGIOPLASTY;  Surgeon: Pearline Norman RAMAN, MD;  Location: [facility] OR;  Service: Vascular;  Laterality: Left;   HERNIA REPAIR     x2   PATCH ANGIOPLASTY Left 04/09/2023   Procedure: ANGIOPLASTY, USING 1 CM X 6 CM XENOSURE PATCH GRAFT;  Surgeon: Pearline Norman RAMAN, MD;  Location: [facility] OR;  Service: Vascular;  Laterality: Left;    Family History  Problem Relation Age of Onset   Hypertension Mother    Stroke Father    Diabetes Father    Hypertension Father    Heart disease Father    Parkinson's disease Father    COPD Sister    Fibromyalgia Sister    Healthy Sister     Social History   Socioeconomic History   Marital status: Single    Spouse name: Not on file   Number of children: 0   Years of education: 12   Highest education level: 12th grade  Occupational History   Occupation: Lobbyist: [employer]  Tobacco Use   Smoking status: Never   Smokeless tobacco: Never  Vaping Use   Vaping status: Never Used  Substance and Sexual Activity   Alcohol use: No    Alcohol/week: 0.0 standard drinks of alcohol   Drug use: No   Sexual activity: Not Currently  Other Topics Concern   Not on file  Social History Narrative   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   He lives with his parents.  He has no children.   Highest level of education:  High school   Right handed   One story home   Social Drivers of Health   Financial Resource Strain: Low Risk  (12/18/2023)   Overall Financial Resource  Strain (CARDIA)    Difficulty of Paying Living Expenses: Not hard at all  Food Insecurity: No Food Insecurity (12/18/2023)   Hunger Vital Sign    Worried About Running Out of Food in the Last Year: Never true    Ran Out of Food in the Last Year: Never true  Transportation Needs: No Transportation Needs (12/18/2023)   PRAPARE - Administrator, Civil Service (Medical): No    Lack of Transportation (Non-Medical): No  Physical Activity: Unknown (12/18/2023)   Exercise Vital Sign    Days of Exercise per Week: Patient declined    Minutes of Exercise per Session: Not on file  Stress: No Stress Concern Present (12/18/2023)   [employer] of Occupational Health - Occupational Stress Questionnaire    Feeling of Stress: Not at all  Social Connections: Moderately Integrated (12/18/2023)   Social Connection and Isolation Panel    Frequency of Communication with Friends and Family: More than three times a week    Frequency of Social Gatherings with Friends and Family: More than three times a week    Attends Religious Services: More than 4 times per year    Active Member of [employer] or Organizations: Yes    Attends Banker Meetings: More than 4 times per year    Marital Status: Never married  Intimate Partner Violence: Not At Risk (04/10/2023)   Humiliation, Afraid, Rape, and Kick questionnaire    Fear of Current or Ex-Partner: No    Emotionally Abused: No    Physically Abused: No    Sexually Abused: No    Outpatient Medications Prior to Visit  Medication Sig Dispense Refill   amLODipine  (NORVASC ) 10 MG tablet Take 1 tablet (10 mg total) by mouth daily. 90 tablet 1   atorvastatin  (LIPITOR) 40 MG tablet Take 1 tablet (40 mg total) by mouth at bedtime. 90 tablet 3   b complex vitamins capsule Take 1 capsule by mouth daily.     Cholecalciferol (D3 ADULT PO) Take 5,000 Units by mouth 2 (two) times a week.     clopidogrel  (PLAVIX ) 75 MG tablet TAKE 1 TABLET BY MOUTH AT  BEDTIME 90 tablet 0   cyanocobalamin  (VITAMIN B12) 1000 MCG tablet Take 1,000 mcg by mouth once a week.     Multiple Vitamin (MULTIVITAMIN WITH MINERALS) TABS tablet Take by mouth 2 (two) times a week.     Omega-3 Fatty Acids (OMEGA-3 FISH OIL PO) Take 970 mg by mouth once a week.     telmisartan  (MICARDIS ) 80 MG tablet Take 0.5 tablets (40 mg total) by mouth daily.     No facility-administered medications prior to visit.    Allergies  Allergen Reactions   Zyrtec  Hali.griffins ] Other (See Comments)    Fatigued, felt  bad, weak   Penicillins Itching and Rash    Little bumps    Review of Systems  Constitutional:  Negative for appetite change, fatigue and fever.  HENT:  Positive for sinus pressure and sore throat. Negative for congestion and ear pain.   Eyes: Negative.   Respiratory:  Positive for cough. Negative for chest tightness, shortness of breath and wheezing.   Cardiovascular:  Negative for chest pain and palpitations.  Gastrointestinal:  Negative for abdominal pain, constipation, diarrhea, nausea and vomiting.  Endocrine: Negative.   Genitourinary:  Negative for dysuria, frequency, hematuria and urgency.  Musculoskeletal:  Negative for arthralgias, back pain, joint swelling and myalgias.  Skin:  Negative for rash.  Allergic/Immunologic: Negative.   Neurological:  Negative for dizziness, weakness, light-headedness and headaches.  Hematological: Negative.   Psychiatric/Behavioral:  Negative for dysphoric mood. The patient is not nervous/anxious.        Objective:        12/18/2023    9:05 AM 10/28/2023    9:06 AM 10/28/2023    8:25 AM  Vitals with BMI  Height 5' 7    Weight 181 lbs    BMI 28.34    Systolic 140 162 811  Diastolic 80 72 83  Pulse 60 49     No data found.   Physical Exam Vitals and nursing note reviewed.  Constitutional:      Appearance: He is well-developed.  HENT:     Head: Normocephalic and atraumatic.     Right Ear: Tympanic membrane  normal.     Left Ear: Tympanic membrane is erythematous (mild erythema).     Nose: Congestion and rhinorrhea present.     Mouth/Throat:     Comments: Post nasal drainage noted Eyes:     Conjunctiva/sclera: Conjunctivae normal.  Cardiovascular:     Rate and Rhythm: Normal rate and regular rhythm.  Musculoskeletal:     Cervical back: Normal range of motion.  Neurological:     Mental Status: He is alert.     Health Maintenance Due  Topic Date Due   HIV Screening  Never done   Hepatitis C Screening  Never done   Hepatitis B Vaccines 19-59 Average Risk (1 of 3 - 19+ 3-dose series) Never done   Pneumococcal Vaccine: 50+ Years (1 of 1 - PCV) Never done   Influenza Vaccine  Never done       Topic Date Due   Hepatitis B Vaccines 19-59 Average Risk (1 of 3 - 19+ 3-dose series) Never done     Lab Results  Component Value Date   TSH 1.450 08/05/2023   Lab Results  Component Value Date   WBC 4.8 08/05/2023   HGB 14.7 08/05/2023   HCT 45.4 08/05/2023   MCV 93 08/05/2023   PLT 169 08/05/2023   Lab Results  Component Value Date   NA 139 08/05/2023   K 4.7 08/05/2023   CO2 19 (L) 08/05/2023   GLUCOSE 107 (H) 08/05/2023   BUN 9 08/05/2023   CREATININE 0.96 08/05/2023   BILITOT 0.5 08/05/2023   ALKPHOS 109 08/05/2023   AST 28 08/05/2023   ALT 39 08/05/2023   PROT 6.7 08/05/2023   ALBUMIN 4.4 08/05/2023   CALCIUM  9.8 08/05/2023   ANIONGAP 9 04/10/2023   EGFR 94 08/05/2023   GFR 88.00 12/10/2022   Lab Results  Component Value Date   CHOL 81 (L) 08/05/2023   Lab Results  Component Value Date   HDL 35 (L) 08/05/2023  Lab Results  Component Value Date   LDLCALC 29 08/05/2023   Lab Results  Component Value Date   TRIG 86 08/05/2023   Lab Results  Component Value Date   CHOLHDL 2.3 08/05/2023   Lab Results  Component Value Date   HGBA1C 5.8 12/10/2022        Results for orders placed or performed in visit on 08/05/23  CBC with Differential/Platelet    Collection Time: 08/05/23  9:47 AM  Result Value Ref Range   WBC 4.8 3.4 - 10.8 x10E3/uL   RBC 4.86 4.14 - 5.80 x10E6/uL   Hemoglobin 14.7 13.0 - 17.7 g/dL   Hematocrit 54.5 62.4 - 51.0 %   MCV 93 79 - 97 fL   MCH 30.2 26.6 - 33.0 pg   MCHC 32.4 31.5 - 35.7 g/dL   RDW 87.6 88.3 - 84.5 %   Platelets 169 150 - 450 x10E3/uL   Neutrophils 50 Not Estab. %   Lymphs 35 Not Estab. %   Monocytes 9 Not Estab. %   Eos 5 Not Estab. %   Basos 1 Not Estab. %   Neutrophils Absolute 2.4 1.4 - 7.0 x10E3/uL   Lymphocytes Absolute 1.7 0.7 - 3.1 x10E3/uL   Monocytes Absolute 0.4 0.1 - 0.9 x10E3/uL   EOS (ABSOLUTE) 0.2 0.0 - 0.4 x10E3/uL   Basophils Absolute 0.0 0.0 - 0.2 x10E3/uL   Immature Granulocytes 0 Not Estab. %   Immature Grans (Abs) 0.0 0.0 - 0.1 x10E3/uL  Comprehensive metabolic panel with GFR   Collection Time: 08/05/23  9:47 AM  Result Value Ref Range   Glucose 107 (H) 70 - 99 mg/dL   BUN 9 6 - 24 mg/dL   Creatinine, Ser 9.03 0.76 - 1.27 mg/dL   eGFR 94 >40 fO/fpw/8.26   BUN/Creatinine Ratio 9 9 - 20   Sodium 139 134 - 144 mmol/L   Potassium 4.7 3.5 - 5.2 mmol/L   Chloride 104 96 - 106 mmol/L   CO2 19 (L) 20 - 29 mmol/L   Calcium  9.8 8.7 - 10.2 mg/dL   Total Protein 6.7 6.0 - 8.5 g/dL   Albumin 4.4 3.8 - 4.9 g/dL   Globulin, Total 2.3 1.5 - 4.5 g/dL   Bilirubin Total 0.5 0.0 - 1.2 mg/dL   Alkaline Phosphatase 109 44 - 121 IU/L   AST 28 0 - 40 IU/L   ALT 39 0 - 44 IU/L  Lipid panel   Collection Time: 08/05/23  9:47 AM  Result Value Ref Range   Cholesterol, Total 81 (L) 100 - 199 mg/dL   Triglycerides 86 0 - 149 mg/dL   HDL 35 (L) >60 mg/dL   VLDL Cholesterol Cal 17 5 - 40 mg/dL   LDL Chol Calc (NIH) 29 0 - 99 mg/dL   Chol/HDL Ratio 2.3 0.0 - 5.0 ratio  TSH   Collection Time: 08/05/23  9:47 AM  Result Value Ref Range   TSH 1.450 0.450 - 4.500 uIU/mL     Assessment & Plan:   Assessment & Plan Viral URI with cough Acute upper respiratory infection with nasal  congestion Symptoms began on Friday with worsening on Saturday, including deep throat scratchiness, nasal congestion, and drainage. No signs of pneumonia or strep throat. Differential includes common cold, COVID-19, or influenza, but symptoms are not severe enough to suggest COVID-19 or influenza. Improvement noted with sweating and dietary changes. - Continue current management with over-the-counter remedies and dietary adjustments. - Encouraged increased fluid intake. - Advised salt water  gargles and steam inhalation for symptom relief. - Recommended taking allergy medication as needed. - Will reassess symptoms at follow-up appointment on Monday.  Hypertension Blood pressure slightly elevated, possibly due to current illness. Home blood pressure readings were within normal range (135/69 mmHg). - Rechecked blood pressure after the visit. - Continue current antihypertensive medication regimen.  Multiple sclerosis Engages in regular physical activity, including walking and exercises at a recreation center, to manage symptoms. - Continue regular physical activity regimen.          Nasal congestion        Body mass index is 28.35 kg/m.SABRA  Assessment and Plan      No orders of the defined types were placed in this encounter.   No orders of the defined types were placed in this encounter.    Follow-up: No follow-ups on file.  An After Visit Summary was printed and given to the patient.  Chella Chapdelaine, MD [facility] [phone]

## 2023-12-18 NOTE — Assessment & Plan Note (Signed)
 SABRA

## 2023-12-18 NOTE — Assessment & Plan Note (Addendum)
 Acute upper respiratory infection with nasal congestion Symptoms began on Friday with worsening on Saturday, including deep throat scratchiness, nasal congestion, and drainage. No signs of pneumonia or strep throat. Differential includes common cold, COVID-19, or influenza, but symptoms are not severe enough to suggest COVID-19 or influenza. Improvement noted with sweating and dietary changes. - Continue current management with over-the-counter remedies and dietary adjustments. - Encouraged increased fluid intake. - Advised salt water gargles and steam inhalation for symptom relief. - Recommended taking allergy medication as needed. - Will reassess symptoms at follow-up appointment on Monday.  Hypertension Blood pressure slightly elevated, possibly due to current illness. Home blood pressure readings were within normal range (135/69 mmHg). - Rechecked blood pressure after the visit. - Continue current antihypertensive medication regimen.  Multiple sclerosis Engages in regular physical activity, including walking and exercises at a recreation center, to manage symptoms. - Continue regular physical activity regimen.

## 2023-12-23 ENCOUNTER — Ambulatory Visit (INDEPENDENT_AMBULATORY_CARE_PROVIDER_SITE_OTHER)

## 2023-12-23 VITALS — BP 122/72 | HR 68 | Temp 97.6°F | Ht 67.0 in | Wt 178.4 lb

## 2023-12-23 DIAGNOSIS — R7303 Prediabetes: Secondary | ICD-10-CM | POA: Insufficient documentation

## 2023-12-23 DIAGNOSIS — I158 Other secondary hypertension: Secondary | ICD-10-CM | POA: Diagnosis not present

## 2023-12-23 DIAGNOSIS — E785 Hyperlipidemia, unspecified: Secondary | ICD-10-CM | POA: Diagnosis not present

## 2023-12-23 DIAGNOSIS — G35D Multiple sclerosis, unspecified: Secondary | ICD-10-CM | POA: Diagnosis not present

## 2023-12-23 LAB — LIPID PANEL
Chol/HDL Ratio: 2.4 ratio (ref 0.0–5.0)
Cholesterol, Total: 82 mg/dL — ABNORMAL LOW (ref 100–199)
HDL: 34 mg/dL — ABNORMAL LOW (ref >39)
LDL Chol Calc (NIH): 35 mg/dL (ref 0–99)
Triglycerides: 54 mg/dL (ref 0–149)
VLDL Cholesterol Cal: 13 mg/dL (ref 5–40)

## 2023-12-23 LAB — CBC WITH DIFFERENTIAL/PLATELET
Basophils Absolute: 0 x10E3/uL (ref 0.0–0.2)
Basos: 0 %
EOS (ABSOLUTE): 0.1 x10E3/uL (ref 0.0–0.4)
Eos: 2 %
Hematocrit: 45.4 % (ref 37.5–51.0)
Hemoglobin: 15.1 g/dL (ref 13.0–17.7)
Immature Grans (Abs): 0 x10E3/uL (ref 0.0–0.1)
Immature Granulocytes: 0 %
Lymphocytes Absolute: 1.6 x10E3/uL (ref 0.7–3.1)
Lymphs: 34 %
MCH: 31.5 pg (ref 26.6–33.0)
MCHC: 33.3 g/dL (ref 31.5–35.7)
MCV: 95 fL (ref 79–97)
Monocytes Absolute: 0.4 x10E3/uL (ref 0.1–0.9)
Monocytes: 8 %
Neutrophils Absolute: 2.7 x10E3/uL (ref 1.4–7.0)
Neutrophils: 55 %
Platelets: 168 x10E3/uL (ref 150–450)
RBC: 4.8 x10E6/uL (ref 4.14–5.80)
RDW: 12.3 % (ref 11.6–15.4)
WBC: 4.8 x10E3/uL (ref 3.4–10.8)

## 2023-12-23 LAB — COMPREHENSIVE METABOLIC PANEL WITH GFR
ALT: 31 IU/L (ref 0–44)
AST: 24 IU/L (ref 0–40)
Albumin: 4.2 g/dL (ref 3.8–4.9)
Alkaline Phosphatase: 107 IU/L (ref 47–123)
BUN/Creatinine Ratio: 11 (ref 9–20)
BUN: 9 mg/dL (ref 6–24)
Bilirubin Total: 0.5 mg/dL (ref 0.0–1.2)
CO2: 24 mmol/L (ref 20–29)
Calcium: 9.8 mg/dL (ref 8.7–10.2)
Chloride: 104 mmol/L (ref 96–106)
Creatinine, Ser: 0.85 mg/dL (ref 0.76–1.27)
Globulin, Total: 2.2 g/dL (ref 1.5–4.5)
Glucose: 110 mg/dL — ABNORMAL HIGH (ref 70–99)
Potassium: 4.7 mmol/L (ref 3.5–5.2)
Sodium: 139 mmol/L (ref 134–144)
Total Protein: 6.4 g/dL (ref 6.0–8.5)
eGFR: 103 mL/min/1.73 (ref >59)

## 2023-12-23 LAB — HEMOGLOBIN A1C
Est. average glucose Bld gHb Est-mCnc: 117 mg/dL
Hgb A1c MFr Bld: 5.7 % — ABNORMAL HIGH (ref 4.8–5.6)

## 2023-12-23 NOTE — Assessment & Plan Note (Addendum)
 Blood pressure is well-controlled with current medication regimen. He is taking telmisartan  40 mg daily and amlodipine  5 mg daily, both achieved by cutting pills in half. He prefers this method as it allows for faster absorption. - Continue current antihypertensive regimen Orders:   Comprehensive metabolic panel with GFR   CBC with Differential/Platelet

## 2023-12-23 NOTE — Assessment & Plan Note (Addendum)
 Prediabetes Previous A1c was borderline at 5.8-5.9. He monitors blood glucose levels at home, which fluctuate based on dietary intake. He is aware of the impact of diet on blood glucose levels and is making dietary adjustments accordingly. - Continue monitoring blood glucose levels at home  Orders:   Hemoglobin A1c

## 2023-12-23 NOTE — Assessment & Plan Note (Addendum)
 Hyperlipidemia Cholesterol levels were previously within acceptable range. He is taking Lipitor 40 mg daily. - Continue Lipitor 40 mg daily Orders:   Lipid panel

## 2023-12-23 NOTE — Progress Notes (Signed)
 Subjective:  Patient ID: Russell Claudene Raddle., male    DOB: [birth year]  Age: 53 y.o. MRN: [id number]  Chief Complaint  Patient presents with   Medical Management of Chronic Issues    HPI: Discussed the use of AI scribe software for clinical note transcription with the patient, who gave verbal consent to proceed. History of Present Illness   History of Present Illness   Russell Odie Edmonds. is a 53 year old male who presents for a routine follow-up.  Upper respiratory symptoms - Experienced symptoms of an upper respiratory infection five days ago - Managed with supportive treatment - Symptoms have resolved  Weight management and physical activity - Lost approximately 2.5 pounds over the past five days - [facility] - Monitored diet, including consumption of papayas  Sleep quality - Sleeps well, especially after eating papayas - Believes papayas improve sleep due to tryptophan content  Glycemic control - Monitors blood glucose at home once a week or every two weeks - Blood glucose levels fluctuate with dietary intake - Higher readings after consuming sweets - Lower readings after eating fruits and exercising - Last A1c was borderline at 5.8-5.9  Cardiovascular risk management - Currently taking Lipitor 40 mg daily for hyperlipidemia - Plavix  75 mg daily - Fish oil supplementation - Telmisartan  40 mg daily (by splitting an 80 mg tablet) - Amlodipine  5 mg daily (split for ease of ingestion)  General well-being - No pain, discomfort, or swelling - No falls - No depression - No alcohol or tobacco use - Denies symptoms of sleep apnea  Laboratory monitoring - Blood work performed in June - Due for repeat blood work today to assess electrolytes, cholesterol, and A1c             12/23/2023    8:47 AM 08/05/2023    9:17 AM 06/10/2023    2:45 PM 05/13/2023    9:59 AM 06/11/2022    9:11 AM  Depression screen PHQ 2/9  Decreased Interest  0 0 0 0 0  Down, Depressed, Hopeless 0 0 0 0 0  PHQ - 2 Score 0 0 0 0 0  Altered sleeping  1     Tired, decreased energy  0     Change in appetite  0     Feeling bad or failure about yourself   0     Trouble concentrating  0     Moving slowly or fidgety/restless  0     Suicidal thoughts  0     PHQ-9 Score  1      Difficult doing work/chores  Not difficult at all        Data saved with a previous flowsheet row definition        12/23/2023    8:47 AM  Fall Risk   Falls in the past year? 0  Number falls in past yr: 0  Injury with Fall? 0  Risk for fall due to : No Fall Risks  Follow up Falls evaluation completed    Patient Care Team: Jackeline Gutknecht, MD as PCP - General (Family Medicine) Patel, [name] K, DO as Consulting Physician (Neurology)   Review of Systems  Constitutional:  Negative for chills, fatigue and fever.  HENT:  Negative for congestion, ear pain, sinus pressure and sore throat.   Respiratory:  Negative for cough and shortness of breath.   Cardiovascular:  Negative for chest pain.  Gastrointestinal:  Negative for abdominal pain, constipation, diarrhea,  nausea and vomiting.  Genitourinary:  Negative for dysuria and frequency.  Musculoskeletal:  Negative for arthralgias, back pain and myalgias.  Neurological:  Negative for dizziness and headaches.  Psychiatric/Behavioral:  Negative for dysphoric mood. The patient is not nervous/anxious.     Current Outpatient Medications on File Prior to Visit  Medication Sig Dispense Refill   atorvastatin  (LIPITOR) 40 MG tablet Take 1 tablet (40 mg total) by mouth at bedtime. 90 tablet 3   Cholecalciferol (D3 ADULT PO) Take 5,000 Units by mouth 2 (two) times a week.     clopidogrel  (PLAVIX ) 75 MG tablet TAKE 1 TABLET BY MOUTH AT BEDTIME 90 tablet 0   cyanocobalamin  (VITAMIN B12) 1000 MCG tablet Take 1,000 mcg by mouth once a week.     Multiple Vitamin (MULTIVITAMIN WITH MINERALS) TABS tablet Take by mouth 2 (two) times a  week.     Omega-3 Fatty Acids (OMEGA-3 FISH OIL PO) Take 970 mg by mouth once a week.     telmisartan  (MICARDIS ) 80 MG tablet Take 0.5 tablets (40 mg total) by mouth daily.     amLODipine  (NORVASC ) 10 MG tablet Take 1 tablet (10 mg total) by mouth daily. 90 tablet 1   No current facility-administered medications on file prior to visit.   Past Medical History:  Diagnosis Date   Accidental overdose 05/13/2023   ASTHMA, CHILDHOOD 04/18/2009   Qualifier: Diagnosis of  By: Norleen MD, Lynwood ORN    BIPOLAR DISORDER UNSPECIFIED 04/18/2009   Qualifier: Diagnosis of  By: Norleen MD, Lynwood ORN    CVA (cerebral vascular accident) (HCC) 05/13/2023   GERD (gastroesophageal reflux disease) 07/30/2013   Hypertension    Insomnia    Multiple sclerosis 2015   Psoriasis 12/15/2013   Stroke (HCC)    Ventral hernia without obstruction or gangrene 12/15/2013   Vitamin D  deficiency 03/18/2019   Past Surgical History:  Procedure Laterality Date   CHOLECYSTECTOMY  09/2020   ENDARTERECTOMY Left 04/09/2023   Procedure: LEFT CAROTID ENDARTERECTOMY WITH PATCH ANGIOPLASTY;  Surgeon: Pearline Norman RAMAN, MD;  Location: [facility] OR;  Service: Vascular;  Laterality: Left;   HERNIA REPAIR     x2   PATCH ANGIOPLASTY Left 04/09/2023   Procedure: ANGIOPLASTY, USING 1 CM X 6 CM XENOSURE PATCH GRAFT;  Surgeon: Pearline Norman RAMAN, MD;  Location: [facility] OR;  Service: Vascular;  Laterality: Left;    Family History  Problem Relation Age of Onset   Hypertension Mother    Stroke Father    Diabetes Father    Hypertension Father    Heart disease Father    Parkinson's disease Father    COPD Sister    Fibromyalgia Sister    Healthy Sister    Social History   Socioeconomic History   Marital status: Single    Spouse name: Not on file   Number of children: 0   Years of education: 12   Highest education level: 12th grade  Occupational History   Occupation: Lobbyist: [employer]  Tobacco Use   Smoking status: Never   Smokeless  tobacco: Never  Vaping Use   Vaping status: Never Used  Substance and Sexual Activity   Alcohol use: No    Alcohol/week: 0.0 standard drinks of alcohol   Drug use: No   Sexual activity: Not Currently  Other Topics Concern   Not on file  Social History Narrative   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   He lives with his parents.  He has no children.   Highest level of education:  High school   Right handed   One story home   Social Drivers of Health   Financial Resource Strain: Low Risk  (12/18/2023)   Overall Financial Resource Strain (CARDIA)    Difficulty of Paying Living Expenses: Not hard at all  Food Insecurity: No Food Insecurity (12/18/2023)   Hunger Vital Sign    Worried About Running Out of Food in the Last Year: Never true    Ran Out of Food in the Last Year: Never true  Transportation Needs: No Transportation Needs (12/18/2023)   PRAPARE - Administrator, Civil Service (Medical): No    Lack of Transportation (Non-Medical): No  Physical Activity: Unknown (12/18/2023)   Exercise Vital Sign    Days of Exercise per Week: Patient declined    Minutes of Exercise per Session: Not on file  Stress: No Stress Concern Present (12/18/2023)   [employer] of Occupational Health - Occupational Stress Questionnaire    Feeling of Stress: Not at all  Social Connections: Moderately Integrated (12/18/2023)   Social Connection and Isolation Panel    Frequency of Communication with Friends and Family: More than three times a week    Frequency of Social Gatherings with Friends and Family: More than three times a week    Attends Religious Services: More than 4 times per year    Active Member of [employer] or Organizations: Yes    Attends Engineer, Structural: More than 4 times per year    Marital Status: Never married    Objective:  BP 122/72   Pulse 68   Temp 97.6 F (36.4 C)   Ht 5' 7 (1.702 m)   Wt 178 lb 6.4 oz (80.9 kg)   SpO2 98%    BMI 27.94 kg/m      12/23/2023    8:45 AM 12/18/2023    9:28 AM 12/18/2023    9:05 AM  BP/Weight  Systolic BP 122 140 140  Diastolic BP 72 80 80  Wt. (Lbs) 178.4  181  BMI 27.94 kg/m2  28.35 kg/m2    Physical Exam Vitals and nursing note reviewed.  Constitutional:      Appearance: Normal appearance.  HENT:     Head: Normocephalic and atraumatic.  Eyes:     Pupils: Pupils are equal, round, and reactive to light.  Cardiovascular:     Rate and Rhythm: Normal rate and regular rhythm.  Pulmonary:     Effort: Pulmonary effort is normal.     Breath sounds: Normal breath sounds.  Abdominal:     General: Bowel sounds are normal.     Palpations: Abdomen is soft.  Musculoskeletal:        General: Normal range of motion.     Cervical back: Normal range of motion.  Neurological:     General: No focal deficit present.     Mental Status: He is alert and oriented to person, place, and time.  Psychiatric:        Mood and Affect: Mood normal.         Lab Results  Component Value Date   WBC 4.8 08/05/2023   HGB 14.7 08/05/2023   HCT 45.4 08/05/2023   PLT 169 08/05/2023   GLUCOSE 107 (H) 08/05/2023   CHOL 81 (L) 08/05/2023   TRIG 86 08/05/2023   HDL 35 (L) 08/05/2023   LDLCALC 29 08/05/2023   ALT 39 08/05/2023   AST 28 08/05/2023  NA 139 08/05/2023   K 4.7 08/05/2023   CL 104 08/05/2023   CREATININE 0.96 08/05/2023   BUN 9 08/05/2023   CO2 19 (L) 08/05/2023   TSH 1.450 08/05/2023   PSA 0.36 06/11/2022   INR 1.0 04/09/2023   HGBA1C 5.8 12/10/2022    Results for orders placed or performed in visit on 08/05/23  CBC with Differential/Platelet   Collection Time: 08/05/23  9:47 AM  Result Value Ref Range   WBC 4.8 3.4 - 10.8 x10E3/uL   RBC 4.86 4.14 - 5.80 x10E6/uL   Hemoglobin 14.7 13.0 - 17.7 g/dL   Hematocrit 54.5 62.4 - 51.0 %   MCV 93 79 - 97 fL   MCH 30.2 26.6 - 33.0 pg   MCHC 32.4 31.5 - 35.7 g/dL   RDW 87.6 88.3 - 84.5 %   Platelets 169 150 - 450  x10E3/uL   Neutrophils 50 Not Estab. %   Lymphs 35 Not Estab. %   Monocytes 9 Not Estab. %   Eos 5 Not Estab. %   Basos 1 Not Estab. %   Neutrophils Absolute 2.4 1.4 - 7.0 x10E3/uL   Lymphocytes Absolute 1.7 0.7 - 3.1 x10E3/uL   Monocytes Absolute 0.4 0.1 - 0.9 x10E3/uL   EOS (ABSOLUTE) 0.2 0.0 - 0.4 x10E3/uL   Basophils Absolute 0.0 0.0 - 0.2 x10E3/uL   Immature Granulocytes 0 Not Estab. %   Immature Grans (Abs) 0.0 0.0 - 0.1 x10E3/uL  Comprehensive metabolic panel with GFR   Collection Time: 08/05/23  9:47 AM  Result Value Ref Range   Glucose 107 (H) 70 - 99 mg/dL   BUN 9 6 - 24 mg/dL   Creatinine, Ser 9.03 0.76 - 1.27 mg/dL   eGFR 94 >40 fO/fpw/8.26   BUN/Creatinine Ratio 9 9 - 20   Sodium 139 134 - 144 mmol/L   Potassium 4.7 3.5 - 5.2 mmol/L   Chloride 104 96 - 106 mmol/L   CO2 19 (L) 20 - 29 mmol/L   Calcium  9.8 8.7 - 10.2 mg/dL   Total Protein 6.7 6.0 - 8.5 g/dL   Albumin 4.4 3.8 - 4.9 g/dL   Globulin, Total 2.3 1.5 - 4.5 g/dL   Bilirubin Total 0.5 0.0 - 1.2 mg/dL   Alkaline Phosphatase 109 44 - 121 IU/L   AST 28 0 - 40 IU/L   ALT 39 0 - 44 IU/L  Lipid panel   Collection Time: 08/05/23  9:47 AM  Result Value Ref Range   Cholesterol, Total 81 (L) 100 - 199 mg/dL   Triglycerides 86 0 - 149 mg/dL   HDL 35 (L) >60 mg/dL   VLDL Cholesterol Cal 17 5 - 40 mg/dL   LDL Chol Calc (NIH) 29 0 - 99 mg/dL   Chol/HDL Ratio 2.3 0.0 - 5.0 ratio  TSH   Collection Time: 08/05/23  9:47 AM  Result Value Ref Range   TSH 1.450 0.450 - 4.500 uIU/mL  .  Assessment & Plan:   Assessment & Plan Hyperlipidemia, unspecified hyperlipidemia type Hyperlipidemia Cholesterol levels were previously within acceptable range. He is taking Lipitor 40 mg daily. - Continue Lipitor 40 mg daily Orders:   Lipid panel  Other secondary hypertension Blood pressure is well-controlled with current medication regimen. He is taking telmisartan  40 mg daily and amlodipine  5 mg daily, both achieved by  cutting pills in half. He prefers this method as it allows for faster absorption. - Continue current antihypertensive regimen Orders:   Comprehensive metabolic panel with GFR  CBC with Differential/Platelet  Multiple sclerosis Stable, in remission. No medications at this time    Borderline diabetes Prediabetes Previous A1c was borderline at 5.8-5.9. He monitors blood glucose levels at home, which fluctuate based on dietary intake. He is aware of the impact of diet on blood glucose levels and is making dietary adjustments accordingly. - Continue monitoring blood glucose levels at home  Orders:   Hemoglobin A1c  Routine follow-up visit with no new complaints. Blood pressure is well-controlled. No falls, depression, pain, discomfort, or swelling. No sleep apnea. Sleep quality is good. No alcohol or tobacco use. Medications are taken as prescribed. Recent weight loss attributed to increased physical activity and dietary modifications. - Performed blood work to assess electrolytes, cholesterol, and A1c levels - Scheduled follow-up appointment in May 2026      Body mass index is 27.94 kg/m.        No orders of the defined types were placed in this encounter.   Orders Placed This Encounter  Procedures   Lipid panel   Comprehensive metabolic panel with GFR   CBC with Differential/Platelet   Hemoglobin A1c       Follow-up: Return for chronic disease follow up.  An After Visit Summary was printed and given to the patient.  Anthony Tamburo, MD [facility] [phone]

## 2023-12-23 NOTE — Assessment & Plan Note (Addendum)
 Stable, in remission. No medications at this time

## 2023-12-24 ENCOUNTER — Ambulatory Visit: Payer: Self-pay

## 2024-01-24 ENCOUNTER — Ambulatory Visit (HOSPITAL_COMMUNITY)
Admission: RE | Admit: 2024-01-24 | Discharge: 2024-01-24 | Disposition: A | Discharge status: Home / Self Care | Source: Ambulatory Visit | Attending: Vascular Surgery | Admitting: Vascular Surgery

## 2024-01-24 ENCOUNTER — Ambulatory Visit

## 2024-01-24 VITALS — BP 154/80 | HR 52 | Temp 98.2°F | Wt 181.9 lb

## 2024-01-24 DIAGNOSIS — I6522 Occlusion and stenosis of left carotid artery: Secondary | ICD-10-CM | POA: Diagnosis not present

## 2024-01-24 NOTE — Progress Notes (Signed)
 " Office Note     CC:  follow up Requesting Provider:  Sirivol, Mamatha, MD  HPI: Russell Moses. is a 53 y.o. (August 26, 1970) male who presents for sore throat carotid artery stenosis.  He underwent left carotid endarterectomy in March of this year due to symptomatic left ICA stenosis.  He denies any strokelike symptoms including slurring speech, changes in vision, or one-sided weakness since last office visit.  He follows regularly with her neurologist for MS.  He is on Plavix  and statin daily.  He denies tobacco use.   Past Medical History:  Diagnosis Date   Accidental overdose 05/13/2023   ASTHMA, CHILDHOOD 04/18/2009   Qualifier: Diagnosis of  By: Norleen MD, Lynwood ORN    BIPOLAR DISORDER UNSPECIFIED 04/18/2009   Qualifier: Diagnosis of  By: Norleen MD, Lynwood ORN    CVA (cerebral vascular accident) (HCC) 05/13/2023   GERD (gastroesophageal reflux disease) 07/30/2013   Hypertension    Insomnia    Multiple sclerosis 2015   Psoriasis 12/15/2013   Stroke (HCC)    Ventral hernia without obstruction or gangrene 12/15/2013   Vitamin D  deficiency 03/18/2019    Past Surgical History:  Procedure Laterality Date   CHOLECYSTECTOMY  09/2020   ENDARTERECTOMY Left 04/09/2023   Procedure: LEFT CAROTID ENDARTERECTOMY WITH PATCH ANGIOPLASTY;  Surgeon: Pearline Norman RAMAN, MD;  Location: [facility] OR;  Service: Vascular;  Laterality: Left;   HERNIA REPAIR     x2   PATCH ANGIOPLASTY Left 04/09/2023   Procedure: ANGIOPLASTY, USING 1 CM X 6 CM XENOSURE PATCH GRAFT;  Surgeon: Pearline Norman RAMAN, MD;  Location: [facility] OR;  Service: Vascular;  Laterality: Left;    Social History   Socioeconomic History   Marital status: Single    Spouse name: Not on file   Number of children: 0   Years of education: 12   Highest education level: 12th grade  Occupational History   Occupation: Lobbyist: [id number]  Tobacco Use   Smoking status: Never   Smokeless tobacco: Never  Vaping Use   Vaping status: Never Used   Substance and Sexual Activity   Alcohol use: No    Alcohol/week: 0.0 standard drinks of alcohol   Drug use: No   Sexual activity: Not Currently  Other Topics Concern   Not on file  Social History Narrative   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   He lives with his parents.  He has no children.   Highest level of education:  High school   Right handed   One story home   Social Drivers of Health   Tobacco Use: Low Risk (01/24/2024)   Patient History    Smoking Tobacco Use: Never    Smokeless Tobacco Use: Never    Passive Exposure: Not on file  Financial Resource Strain: Low Risk (12/18/2023)   Overall Financial Resource Strain (CARDIA)    Difficulty of Paying Living Expenses: Not hard at all  Food Insecurity: No Food Insecurity (12/18/2023)   Epic    Worried About Programme Researcher, Broadcasting/film/video in the Last Year: Never true    Ran Out of Food in the Last Year: Never true  Transportation Needs: No Transportation Needs (12/18/2023)   Epic    Lack of Transportation (Medical): No    Lack of Transportation (Non-Medical): No  Physical Activity: Unknown (12/18/2023)   Exercise Vital Sign    Days of Exercise per Week: Patient declined    Minutes of Exercise per Session: Not  on file  Stress: No Stress Concern Present (12/18/2023)   [employer] of Occupational Health - Occupational Stress Questionnaire    Feeling of Stress: Not at all  Social Connections: Moderately Integrated (12/18/2023)   Social Connection and Isolation Panel    Frequency of Communication with Friends and Family: More than three times a week    Frequency of Social Gatherings with Friends and Family: More than three times a week    Attends Religious Services: More than 4 times per year    Active Member of [employer] or Organizations: Yes    Attends Banker Meetings: More than 4 times per year    Marital Status: Never married  Intimate Partner Violence: Not At Risk (04/10/2023)   Humiliation,  Afraid, Rape, and Kick questionnaire    Fear of Current or Ex-Partner: No    Emotionally Abused: No    Physically Abused: No    Sexually Abused: No  Depression (PHQ2-9): Low Risk (12/23/2023)   Depression (PHQ2-9)    PHQ-2 Score: 0  Alcohol Screen: Low Risk (06/10/2023)   Alcohol Screen    Last Alcohol Screening Score (AUDIT): 0  Housing: Unknown (12/18/2023)   Epic    Unable to Pay for Housing in the Last Year: No    Number of Times Moved in the Last Year: Not on file    Homeless in the Last Year: No  Utilities: Not At Risk (04/10/2023)   AHC Utilities    Threatened with loss of utilities: No  Health Literacy: Adequate Health Literacy (06/10/2023)   B1300 Health Literacy    Frequency of need for help with medical instructions: Never    Family History  Problem Relation Age of Onset   Hypertension Mother    Stroke Father    Diabetes Father    Hypertension Father    Heart disease Father    Parkinson's disease Father    COPD Sister    Fibromyalgia Sister    Healthy Sister     Current Outpatient Medications  Medication Sig Dispense Refill   amLODipine  (NORVASC ) 10 MG tablet Take 1 tablet (10 mg total) by mouth daily. 90 tablet 1   atorvastatin  (LIPITOR) 40 MG tablet Take 1 tablet (40 mg total) by mouth at bedtime. 90 tablet 3   Cholecalciferol (D3 ADULT PO) Take 5,000 Units by mouth 2 (two) times a week.     clopidogrel  (PLAVIX ) 75 MG tablet TAKE 1 TABLET BY MOUTH AT BEDTIME 90 tablet 0   cyanocobalamin  (VITAMIN B12) 1000 MCG tablet Take 1,000 mcg by mouth once a week.     Multiple Vitamin (MULTIVITAMIN WITH MINERALS) TABS tablet Take by mouth 2 (two) times a week.     Omega-3 Fatty Acids (OMEGA-3 FISH OIL PO) Take 970 mg by mouth once a week.     telmisartan  (MICARDIS ) 80 MG tablet Take 0.5 tablets (40 mg total) by mouth daily.     No current facility-administered medications for this visit.    Allergies[1]   REVIEW OF SYSTEMS:  Negative unless noted in HPI [X]  denotes  positive finding, [ ]  denotes negative finding Cardiac  Comments:  Chest pain or chest pressure:    Shortness of breath upon exertion:    Short of breath when lying flat:    Irregular heart rhythm:        Vascular    Pain in calf, thigh, or hip brought on by ambulation:    Pain in feet at night that wakes you up from  your sleep:     Blood clot in your veins:    Leg swelling:         Pulmonary    Oxygen at home:    Productive cough:     Wheezing:         Neurologic    Sudden weakness in arms or legs:     Sudden numbness in arms or legs:     Sudden onset of difficulty speaking or slurred speech:    Temporary loss of vision in one eye:     Problems with dizziness:         Gastrointestinal    Blood in stool:     Vomited blood:         Genitourinary    Burning when urinating:     Blood in urine:        Psychiatric    Major depression:         Hematologic    Bleeding problems:    Problems with blood clotting too easily:        Skin    Rashes or ulcers:        Constitutional    Fever or chills:      PHYSICAL EXAMINATION:  Vitals:   01/24/24 0904 01/24/24 0905  BP: (!) 167/75 (!) 154/80  Pulse: (!) 51 (!) 52  Temp: 98.2 F (36.8 C)   TempSrc: Temporal   Weight: 181 lb 14.4 oz (82.5 kg)     General:  WDWN in NAD; vital signs documented above Gait: Not observed HENT: WNL, normocephalic Pulmonary: normal non-labored breathing Cardiac: regular HR Abdomen: soft, NT, no masses Skin: without rashes Vascular Exam/Pulses: symmetrical radial pulses Extremities: without ischemic changes, without Gangrene , without cellulitis; without open wounds;  Musculoskeletal: no muscle wasting or atrophy  Neurologic: A&O X 3; CN grossly intact Psychiatric:  The pt has Normal affect.   Non-Invasive Vascular Imaging:   Right ICA 1 to 39% stenosis Left ICA widely patent endarterectomy site    ASSESSMENT/PLAN:: 53 y.o. male here for follow up for surveillance of carotid  artery stenosis  Subjectively, Russell Moses is doing well.  He has not had any neurological events since last office visit.  Duplex demonstrates stable 1 to 39% stenosis of the right ICA.  Left ICA endarterectomy site is widely patent without any evidence of restenosis.  He is on Plavix  and statin daily.  Okay from a vascular surgery standpoint to discontinue Plavix  and start 81 mg of aspirin  daily however would defer to his neurologist.  We will repeat carotid duplex in 1 year.   Russell Sender, PA-C [facility] [phone]  Clinic MD:   Pearline     [1]  Allergies Allergen Reactions   Zyrtec  [Cetirizine ] Other (See Comments)    Fatigued, felt bad, weak   Penicillins Itching and Rash    Little bumps   "

## 2024-01-28 ENCOUNTER — Ambulatory Visit: Admitting: Family Medicine

## 2024-01-28 ENCOUNTER — Encounter: Payer: Self-pay | Admitting: Family Medicine

## 2024-01-28 VITALS — BP 156/84 | HR 63 | Temp 98.2°F | Resp 16 | Ht 67.0 in | Wt 180.6 lb

## 2024-01-28 DIAGNOSIS — R140 Abdominal distension (gaseous): Secondary | ICD-10-CM

## 2024-01-28 DIAGNOSIS — I10 Essential (primary) hypertension: Secondary | ICD-10-CM | POA: Diagnosis not present

## 2024-01-28 DIAGNOSIS — J302 Other seasonal allergic rhinitis: Secondary | ICD-10-CM

## 2024-01-28 DIAGNOSIS — H66012 Acute suppurative otitis media with spontaneous rupture of ear drum, left ear: Secondary | ICD-10-CM

## 2024-01-28 MED ORDER — FLUTICASONE PROPIONATE 50 MCG/ACT NA SUSP: 2.0000 | Freq: Every day | NASAL | 6 refills | Status: AC

## 2024-01-28 MED ORDER — AZITHROMYCIN 250 MG PO TABS: ORAL_TABLET | ORAL | 0 refills | Status: AC

## 2024-01-28 MED ORDER — FEXOFENADINE HCL 180 MG PO TABS: 180.0000 mg | ORAL_TABLET | Freq: Every day | ORAL | 0 refills | Status: AC

## 2024-01-28 MED ORDER — SIMETHICONE 180 MG PO CAPS: 1.0000 | ORAL_CAPSULE | Freq: Every day | ORAL | 0 refills | Status: AC | PRN

## 2024-01-28 MED ORDER — PROBIOTIC 1-250 BILLION-MG PO CAPS: 1.0000 | ORAL_CAPSULE | Freq: Every day | ORAL | 0 refills | Status: AC

## 2024-01-28 NOTE — Progress Notes (Signed)
 "  Acute Office Visit  Subjective:    Patient ID: Russell Moses., male    DOB: 11-23-1970, 53 y.o.   MRN: [id number]  Chief Complaint  Patient presents with   Sinus Problem   Bloated   Discussed the use of AI scribe software for clinical note transcription with the patient, who gave verbal consent to proceed.  History of Present Illness   Russell Moses. is a 53 year old male with hypertension who presents with sinus congestion, shoulder pain, and bloating.  Sinonasal congestion and upper respiratory symptoms - Sinus congestion for 1.5 to 2 weeks - No fever, chills, sore throat, sinus pain, or pressure - Occasional mild cough - Frequent nose blowing relieves congestion - No loss of smell or taste - No shortness of breath or chest pain - Takes off-brand Allegra  (fexofenadine ) 180 mg for allergies - No use of Flonase  - Recent use of decongestant cough syrup before leaving work and again this morning  Hypertension - Blood pressure 154/80 on December 19th, increased to 170/82 today - Takes 40 mg of blood pressure medication in the morning and another 40 mg at 12:30 PM, along with amlodipine   Musculoskeletal pain - Shoulder pain - Performs exercises with light weights to strengthen arms and legs - No medication taken for sore muscles, but has arthritis Tylenol  at home - History of multiple sclerosis - Uses a back brace, which may contribute to musculoskeletal symptoms  Gastrointestinal symptoms - Bloating started within the last 1.5 to 2 weeks - No medication taken for bloating - Diet includes fruits and vegetables - Recent dietary changes - No diarrhea - Typical breakfast is oatmeal with various fruits       Past Medical History:  Diagnosis Date   Accidental overdose 05/13/2023   ASTHMA, CHILDHOOD 04/18/2009   Qualifier: Diagnosis of  By: Norleen MD, [place] ORN    BIPOLAR DISORDER UNSPECIFIED 04/18/2009   Qualifier: Diagnosis of  By: Norleen MD, [place] ORN    CVA (cerebral  vascular accident) (HCC) 05/13/2023   GERD (gastroesophageal reflux disease) 07/30/2013   Hypertension    Insomnia    Multiple sclerosis 2015   Psoriasis 12/15/2013   Stroke (HCC)    Ventral hernia without obstruction or gangrene 12/15/2013   Vitamin D  deficiency 03/18/2019    Past Surgical History:  Procedure Laterality Date   CHOLECYSTECTOMY  09/2020   ENDARTERECTOMY Left 04/09/2023   Procedure: LEFT CAROTID ENDARTERECTOMY WITH PATCH ANGIOPLASTY;  Surgeon: Pearline Norman RAMAN, MD;  [facility];  Service: Vascular;  Laterality: Left;   HERNIA REPAIR     x2   PATCH ANGIOPLASTY Left 04/09/2023   Procedure: ANGIOPLASTY, USING 1 CM X 6 CM XENOSURE PATCH GRAFT;  Surgeon: Pearline Norman RAMAN, MD;  Location: [facility] OR;  Service: Vascular;  Laterality: Left;    Family History  Problem Relation Age of Onset   Hypertension Mother    Stroke Father    Diabetes Father    Hypertension Father    Heart disease Father    Parkinson's disease Father    COPD Sister    Fibromyalgia Sister    Healthy Sister     Social History   Socioeconomic History   Marital status: Single    Spouse name: Not on file   Number of children: 0   Years of education: 12   Highest education level: 12th grade  Occupational History   Occupation: Lobbyist: [id number]  Tobacco Use   Smoking status:  Never   Smokeless tobacco: Never  Vaping Use   Vaping status: Never Used  Substance and Sexual Activity   Alcohol use: No    Alcohol/week: 0.0 standard drinks of alcohol   Drug use: No   Sexual activity: Not Currently  Other Topics Concern   Not on file  Social History Narrative   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   He lives with his parents.  He has no children.   Highest level of education:  High school   Right handed   One story home   Social Drivers of Health   Tobacco Use: Low Risk (01/28/2024)   Patient History    Smoking Tobacco Use: Never    Smokeless Tobacco Use: Never     Passive Exposure: Not on file  Financial Resource Strain: Low Risk (12/18/2023)   Overall Financial Resource Strain (CARDIA)    Difficulty of Paying Living Expenses: Not hard at all  Food Insecurity: No Food Insecurity (12/18/2023)   Epic    Worried About Programme Researcher, Broadcasting/film/video in the Last Year: Never true    Ran Out of Food in the Last Year: Never true  Transportation Needs: No Transportation Needs (12/18/2023)   Epic    Lack of Transportation (Medical): No    Lack of Transportation (Non-Medical): No  Physical Activity: Unknown (12/18/2023)   Exercise Vital Sign    Days of Exercise per Week: Patient declined    Minutes of Exercise per Session: Not on file  Stress: No Stress Concern Present (12/18/2023)   [employer] of Occupational Health - Occupational Stress Questionnaire    Feeling of Stress: Not at all  Social Connections: Moderately Integrated (12/18/2023)   Social Connection and Isolation Panel    Frequency of Communication with Friends and Family: More than three times a week    Frequency of Social Gatherings with Friends and Family: More than three times a week    Attends Religious Services: More than 4 times per year    Active Member of [employer] or Organizations: Yes    Attends Banker Meetings: More than 4 times per year    Marital Status: Never married  Intimate Partner Violence: Not At Risk (04/10/2023)   Humiliation, Afraid, Rape, and Kick questionnaire    Fear of Current or Ex-Partner: No    Emotionally Abused: No    Physically Abused: No    Sexually Abused: No  Depression (PHQ2-9): Low Risk (12/23/2023)   Depression (PHQ2-9)    PHQ-2 Score: 0  Alcohol Screen: Low Risk (06/10/2023)   Alcohol Screen    Last Alcohol Screening Score (AUDIT): 0  Housing: Unknown (12/18/2023)   Epic    Unable to Pay for Housing in the Last Year: No    Number of Times Moved in the Last Year: Not on file    Homeless in the Last Year: No  Utilities: Not At Risk (04/10/2023)    [employer] Utilities    Threatened with loss of utilities: No  Health Literacy: Adequate Health Literacy (06/10/2023)   B1300 Health Literacy    Frequency of need for help with medical instructions: Never    Outpatient Medications Prior to Visit  Medication Sig Dispense Refill   amLODipine  (NORVASC ) 10 MG tablet Take 1 tablet (10 mg total) by mouth daily. 90 tablet 1   atorvastatin  (LIPITOR) 40 MG tablet Take 1 tablet (40 mg total) by mouth at bedtime. 90 tablet 3   Cholecalciferol (D3 ADULT PO) Take 5,000  Units by mouth 2 (two) times a week.     clopidogrel  (PLAVIX ) 75 MG tablet TAKE 1 TABLET BY MOUTH AT BEDTIME 90 tablet 0   cyanocobalamin  (VITAMIN B12) 1000 MCG tablet Take 1,000 mcg by mouth once a week.     Multiple Vitamin (MULTIVITAMIN WITH MINERALS) TABS tablet Take by mouth 2 (two) times a week.     Omega-3 Fatty Acids (OMEGA-3 FISH OIL PO) Take 970 mg by mouth once a week.     telmisartan  (MICARDIS ) 80 MG tablet Take 0.5 tablets (40 mg total) by mouth daily.     No facility-administered medications prior to visit.    Allergies[1]  Review of Systems  Constitutional:  Negative for appetite change, chills, fatigue and fever.  HENT:  Positive for congestion. Negative for ear pain, sinus pressure, sinus pain and sore throat.   Eyes: Negative.   Respiratory:  Positive for cough (slight). Negative for chest tightness, shortness of breath and wheezing.   Cardiovascular:  Negative for chest pain and palpitations.  Gastrointestinal:  Negative for abdominal pain, constipation, diarrhea, nausea and vomiting.  Endocrine: Negative.   Genitourinary:  Negative for dysuria and hematuria.  Musculoskeletal:  Negative for arthralgias, back pain, joint swelling and myalgias.  Skin:  Negative for rash.  Allergic/Immunologic: Negative.   Neurological:  Negative for dizziness, weakness, light-headedness and headaches.  Psychiatric/Behavioral:  Negative for dysphoric mood. The patient is not  nervous/anxious.        Objective:        01/28/2024    3:53 PM 01/28/2024    3:06 PM 01/24/2024    9:05 AM  Vitals with BMI  Height  5' 7   Weight  180 lbs 10 oz   BMI  28.28   Systolic 156 170 845  Diastolic 84 82 80  Pulse  63 52    Orthostatic VS for the past 72 hrs (Last 3 readings):  Patient Position BP Location Cuff Size  01/28/24 1553 Sitting Left Arm Large     Physical Exam Vitals reviewed.  Constitutional:      General: He is not in acute distress.    Appearance: Normal appearance. He is ill-appearing.  HENT:     Right Ear: No tenderness. Tympanic membrane is not erythematous.     Left Ear: No tenderness. Tympanic membrane is erythematous.     Nose: Congestion present. No rhinorrhea.  Eyes:     Conjunctiva/sclera: Conjunctivae normal.  Cardiovascular:     Rate and Rhythm: Normal rate and regular rhythm.     Heart sounds: Normal heart sounds. No murmur heard. Pulmonary:     Effort: Pulmonary effort is normal. No respiratory distress.     Breath sounds: Normal breath sounds. No wheezing.  Abdominal:     General: Bowel sounds are normal. There is distension.     Palpations: Abdomen is soft.  Neurological:     Mental Status: He is alert. Mental status is at baseline.  Psychiatric:        Mood and Affect: Mood normal.        Behavior: Behavior normal.     Health Maintenance Due  Topic Date Due   HIV Screening  Never done   Hepatitis C Screening  Never done    There are no preventive care reminders to display for this patient.   Lab Results  Component Value Date   TSH 1.450 08/05/2023   Lab Results  Component Value Date   WBC 4.8 12/23/2023   HGB  15.1 12/23/2023   HCT 45.4 12/23/2023   MCV 95 12/23/2023   PLT 168 12/23/2023   Lab Results  Component Value Date   NA 139 12/23/2023   K 4.7 12/23/2023   CO2 24 12/23/2023   GLUCOSE 110 (H) 12/23/2023   BUN 9 12/23/2023   CREATININE 0.85 12/23/2023   BILITOT 0.5 12/23/2023   ALKPHOS  107 12/23/2023   AST 24 12/23/2023   ALT 31 12/23/2023   PROT 6.4 12/23/2023   ALBUMIN 4.2 12/23/2023   CALCIUM  9.8 12/23/2023   ANIONGAP 9 04/10/2023   EGFR 103 12/23/2023   GFR 88.00 12/10/2022   Lab Results  Component Value Date   CHOL 82 (L) 12/23/2023   Lab Results  Component Value Date   HDL 34 (L) 12/23/2023   Lab Results  Component Value Date   LDLCALC 35 12/23/2023   Lab Results  Component Value Date   TRIG 54 12/23/2023   Lab Results  Component Value Date   CHOLHDL 2.4 12/23/2023   Lab Results  Component Value Date   HGBA1C 5.7 (H) 12/23/2023        Results for orders placed or performed in visit on 12/23/23  Lipid panel   Collection Time: 12/23/23  9:28 AM  Result Value Ref Range   Cholesterol, Total 82 (L) 100 - 199 mg/dL   Triglycerides 54 0 - 149 mg/dL   HDL 34 (L) >60 mg/dL   VLDL Cholesterol Cal 13 5 - 40 mg/dL   LDL Chol Calc (NIH) 35 0 - 99 mg/dL   Chol/HDL Ratio 2.4 0.0 - 5.0 ratio  Comprehensive metabolic panel with GFR   Collection Time: 12/23/23  9:28 AM  Result Value Ref Range   Glucose 110 (H) 70 - 99 mg/dL   BUN 9 6 - 24 mg/dL   Creatinine, Ser 9.14 0.76 - 1.27 mg/dL   eGFR 896 >40 fO/fpw/8.26   BUN/Creatinine Ratio 11 9 - 20   Sodium 139 134 - 144 mmol/L   Potassium 4.7 3.5 - 5.2 mmol/L   Chloride 104 96 - 106 mmol/L   CO2 24 20 - 29 mmol/L   Calcium  9.8 8.7 - 10.2 mg/dL   Total Protein 6.4 6.0 - 8.5 g/dL   Albumin 4.2 3.8 - 4.9 g/dL   Globulin, Total 2.2 1.5 - 4.5 g/dL   Bilirubin Total 0.5 0.0 - 1.2 mg/dL   Alkaline Phosphatase 107 47 - 123 IU/L   AST 24 0 - 40 IU/L   ALT 31 0 - 44 IU/L  CBC with Differential/Platelet   Collection Time: 12/23/23  9:28 AM  Result Value Ref Range   WBC 4.8 3.4 - 10.8 x10E3/uL   RBC 4.80 4.14 - 5.80 x10E6/uL   Hemoglobin 15.1 13.0 - 17.7 g/dL   Hematocrit 54.5 62.4 - 51.0 %   MCV 95 79 - 97 fL   MCH 31.5 26.6 - 33.0 pg   MCHC 33.3 31.5 - 35.7 g/dL   RDW 87.6 88.3 - 84.5 %    Platelets 168 150 - 450 x10E3/uL   Neutrophils 55 Not Estab. %   Lymphs 34 Not Estab. %   Monocytes 8 Not Estab. %   Eos 2 Not Estab. %   Basos 0 Not Estab. %   Neutrophils Absolute 2.7 1.4 - 7.0 x10E3/uL   Lymphocytes Absolute 1.6 0.7 - 3.1 x10E3/uL   Monocytes Absolute 0.4 0.1 - 0.9 x10E3/uL   EOS (ABSOLUTE) 0.1 0.0 - 0.4 x10E3/uL   Basophils Absolute  0.0 0.0 - 0.2 x10E3/uL   Immature Granulocytes 0 Not Estab. %   Immature Grans (Abs) 0.0 0.0 - 0.1 x10E3/uL  Hemoglobin A1c   Collection Time: 12/23/23  9:28 AM  Result Value Ref Range   Hgb A1c MFr Bld 5.7 (H) 4.8 - 5.6 %   Est. average glucose Bld gHb Est-mCnc 117 mg/dL     Assessment & Plan:   Assessment & Plan Bloating Abdominal bloating Bloating possibly related to dietary changes. No diarrhea or spasms. Considered dietary factors and potential GI infection. - Recommended probiotics. - Advised dietary changes, including a FODMAP diet. - Prescribed simethicone  for gas relief. Orders:   Simethicone  180 MG CAPS; Take 1 capsule (180 mg total) by mouth daily as needed.   Bacillus Coagulans-Inulin (PROBIOTIC) 1-250 BILLION-MG CAPS; Take 1 capsule by mouth daily with breakfast.  Non-recurrent acute suppurative otitis media of left ear with spontaneous rupture of tympanic membrane Acute suppurative otitis media, left ear Left ear infection with congestion and fluid buildup. No pain. Treated with antibiotics. - Prescribed Z-Pak (azithromycin ) for 5 days. Orders:   azithromycin  (ZITHROMAX ) 250 MG tablet; Take 2 tablets on day 1, then 1 tablet daily on days 2 through 5  Seasonal allergies Seasonal allergic rhinitis Sinus congestion and nasal discharge. No fever or significant sinus pain. Considered Flonase  for additional relief. - Continue Allegra  (fexofenadine ) 180 mg. - Prescribed Flonase  for nasal congestion. Orders:   fluticasone  (FLONASE ) 50 MCG/ACT nasal spray; Place 2 sprays into both nostrils daily.   fexofenadine   (ALLEGRA ) 180 MG tablet; Take 1 tablet (180 mg total) by mouth daily.  Primary hypertension Hypertension Elevated blood pressure, possibly due to sinus congestion and decongestant use. - Increased blood pressure medication to full dose daily. - Rechecked blood pressure before leaving the clinic. - Scheduled follow-up in 2 weeks to reassess blood pressure.       Follow-up: Return in about 2 weeks (around 02/11/2024) for nurse visit, BP recheck.  An After Visit Summary was printed and given to the patient.  Harrie Cedar, FNP [facility] [phone]      [1]  Allergies Allergen Reactions   Zyrtec  [Cetirizine ] Other (See Comments)    Fatigued, felt bad, weak   Penicillins Itching and Rash    Little bumps   "

## 2024-01-28 NOTE — Patient Instructions (Signed)
" °  VISIT SUMMARY: Today, you were seen for sinus congestion, shoulder pain, and bloating. We also reviewed your hypertension management. You were diagnosed with a left ear infection, seasonal allergies, and abdominal bloating. Your blood pressure was elevated, and we discussed adjustments to your medication.  YOUR PLAN: -ACUTE SUPPURATIVE OTITIS MEDIA, LEFT EAR: This is an infection in your left ear causing congestion and fluid buildup. You were prescribed a 5-day course of Z-Pak (azithromycin ) to treat the infection.  -ABDOMINAL BLOATING: Your bloating may be related to recent dietary changes. We recommend taking probiotics and following a FODMAP diet to help manage your symptoms. You were also prescribed simethicone  for gas relief.  -SEASONAL ALLERGIC RHINITIS: This is an inflammation of the nasal passages due to allergies, causing sinus congestion and nasal discharge. Continue taking Allegra  (fexofenadine ) 180 mg and start using Flonase  for additional relief.  -HYPERTENSION: Your blood pressure was elevated today, which may be related to your sinus congestion and the use of decongestants. We have increased your blood pressure medication to the full dose daily and will recheck your blood pressure in 2 weeks.  INSTRUCTIONS: Please follow up in 2 weeks to reassess your blood pressure. Continue taking your medications as prescribed and follow the dietary recommendations provided.                      Contains text generated by [employer].                                 Contains text generated by [employer].   "

## 2024-01-28 NOTE — Assessment & Plan Note (Signed)
 Hypertension Elevated blood pressure, possibly due to sinus congestion and decongestant use. - Increased blood pressure medication to full dose daily. - Rechecked blood pressure before leaving the clinic. - Scheduled follow-up in 2 weeks to reassess blood pressure.

## 2024-02-10 ENCOUNTER — Ambulatory Visit

## 2024-02-26 ENCOUNTER — Other Ambulatory Visit: Payer: Self-pay

## 2024-04-27 ENCOUNTER — Ambulatory Visit: Admitting: Neurology

## 2024-06-22 ENCOUNTER — Ambulatory Visit
